# Patient Record
Sex: Female | Born: 1945
Health system: Southern US, Community
[De-identification: ages and names within clinical notes are randomized; demographics above are authoritative.]

## PROBLEM LIST (undated history)

## (undated) DIAGNOSIS — R112 Nausea with vomiting, unspecified: Secondary | ICD-10-CM

## (undated) DIAGNOSIS — E669 Obesity, unspecified: Secondary | ICD-10-CM

## (undated) DIAGNOSIS — E7800 Pure hypercholesterolemia, unspecified: Secondary | ICD-10-CM

## (undated) DIAGNOSIS — M545 Low back pain, unspecified: Secondary | ICD-10-CM

## (undated) DIAGNOSIS — I10 Essential (primary) hypertension: Secondary | ICD-10-CM

## (undated) DIAGNOSIS — T884XXA Failed or difficult intubation, initial encounter: Secondary | ICD-10-CM

## (undated) DIAGNOSIS — K589 Irritable bowel syndrome without diarrhea: Secondary | ICD-10-CM

## (undated) DIAGNOSIS — G4730 Sleep apnea, unspecified: Secondary | ICD-10-CM

## (undated) DIAGNOSIS — N6019 Diffuse cystic mastopathy of unspecified breast: Secondary | ICD-10-CM

## (undated) DIAGNOSIS — F32A Depression, unspecified: Secondary | ICD-10-CM

## (undated) DIAGNOSIS — Z8719 Personal history of other diseases of the digestive system: Secondary | ICD-10-CM

## (undated) DIAGNOSIS — E785 Hyperlipidemia, unspecified: Secondary | ICD-10-CM

## (undated) DIAGNOSIS — J45909 Unspecified asthma, uncomplicated: Secondary | ICD-10-CM

## (undated) DIAGNOSIS — Z9889 Other specified postprocedural states: Secondary | ICD-10-CM

## (undated) DIAGNOSIS — F419 Anxiety disorder, unspecified: Secondary | ICD-10-CM

## (undated) DIAGNOSIS — F329 Major depressive disorder, single episode, unspecified: Secondary | ICD-10-CM

## (undated) DIAGNOSIS — Z8489 Family history of other specified conditions: Secondary | ICD-10-CM

## (undated) DIAGNOSIS — G8929 Other chronic pain: Secondary | ICD-10-CM

## (undated) DIAGNOSIS — M1990 Unspecified osteoarthritis, unspecified site: Secondary | ICD-10-CM

## (undated) HISTORY — PX: JOINT REPLACEMENT: SHX530

## (undated) HISTORY — PX: BACK SURGERY: SHX140

## (undated) HISTORY — PX: COLONOSCOPY W/ POLYPECTOMY: SHX1380

## (undated) HISTORY — PX: DILATION AND CURETTAGE OF UTERUS: SHX78

## (undated) HISTORY — DX: Obesity, unspecified: E66.9

## (undated) HISTORY — DX: Diffuse cystic mastopathy of unspecified breast: N60.19

## (undated) HISTORY — PX: FINGER SURGERY: SHX640

---
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
## 1972-10-03 HISTORY — PX: EYE SURGERY: SHX253

## 1979-06-04 HISTORY — PX: BREAST CYST ASPIRATION: SHX578

## 1979-10-04 HISTORY — PX: LUMBAR LAMINECTOMY: SHX95

## 1979-10-04 HISTORY — PX: TUBAL LIGATION: SHX77

## 1981-10-03 HISTORY — PX: TONSILLECTOMY: SUR1361

## 1988-10-03 HISTORY — PX: DEBRIDEMENT TENNIS ELBOW: SHX1442

## 1994-10-03 HISTORY — PX: TOTAL ABDOMINAL HYSTERECTOMY: SHX209

## 1997-10-03 HISTORY — PX: LAPAROSCOPIC CHOLECYSTECTOMY: SUR755

## 2000-10-03 HISTORY — PX: BILATERAL TEMPOROMANDIBULAR JOINT ARTHROPLASTY: SUR77

## 2004-02-06 ENCOUNTER — Encounter: Admission: RE | Admit: 2004-02-06 | Discharge: 2004-02-06 | Payer: Self-pay | Admitting: Internal Medicine

## 2004-02-13 ENCOUNTER — Other Ambulatory Visit: Admission: RE | Admit: 2004-02-13 | Discharge: 2004-02-13 | Payer: Self-pay | Admitting: Family Medicine

## 2005-03-09 ENCOUNTER — Encounter: Admission: RE | Admit: 2005-03-09 | Discharge: 2005-03-09 | Payer: Self-pay | Admitting: Internal Medicine

## 2006-04-04 ENCOUNTER — Encounter: Admission: RE | Admit: 2006-04-04 | Discharge: 2006-04-04 | Payer: Self-pay | Admitting: Internal Medicine

## 2007-02-08 ENCOUNTER — Encounter (INDEPENDENT_AMBULATORY_CARE_PROVIDER_SITE_OTHER): Payer: Self-pay | Admitting: Specialist

## 2007-02-08 ENCOUNTER — Ambulatory Visit (HOSPITAL_BASED_OUTPATIENT_CLINIC_OR_DEPARTMENT_OTHER): Admission: RE | Admit: 2007-02-08 | Discharge: 2007-02-08 | Payer: Self-pay | Admitting: Orthopedic Surgery

## 2007-06-08 ENCOUNTER — Encounter: Admission: RE | Admit: 2007-06-08 | Discharge: 2007-06-08 | Payer: Self-pay | Admitting: Internal Medicine

## 2008-06-19 ENCOUNTER — Encounter: Admission: RE | Admit: 2008-06-19 | Discharge: 2008-06-19 | Payer: Self-pay | Admitting: Internal Medicine

## 2009-01-01 HISTORY — PX: LUMBAR DISC SURGERY: SHX700

## 2009-01-23 ENCOUNTER — Ambulatory Visit (HOSPITAL_COMMUNITY): Admission: RE | Admit: 2009-01-23 | Discharge: 2009-01-23 | Payer: Self-pay | Admitting: Neurological Surgery

## 2009-01-30 ENCOUNTER — Ambulatory Visit (HOSPITAL_COMMUNITY): Admission: RE | Admit: 2009-01-30 | Discharge: 2009-01-31 | Payer: Self-pay | Admitting: Neurological Surgery

## 2009-04-07 ENCOUNTER — Encounter: Admission: RE | Admit: 2009-04-07 | Discharge: 2009-04-07 | Payer: Self-pay | Admitting: Neurological Surgery

## 2009-05-26 ENCOUNTER — Encounter: Admission: RE | Admit: 2009-05-26 | Discharge: 2009-05-26 | Payer: Self-pay | Admitting: Neurological Surgery

## 2009-06-23 ENCOUNTER — Encounter: Admission: RE | Admit: 2009-06-23 | Discharge: 2009-06-23 | Payer: Self-pay | Admitting: Neurological Surgery

## 2009-06-23 ENCOUNTER — Encounter: Admission: RE | Admit: 2009-06-23 | Discharge: 2009-06-23 | Payer: Self-pay | Admitting: Internal Medicine

## 2009-08-03 HISTORY — PX: LUMBAR FUSION: SHX111

## 2009-08-13 ENCOUNTER — Inpatient Hospital Stay (HOSPITAL_COMMUNITY): Admission: RE | Admit: 2009-08-13 | Discharge: 2009-08-16 | Payer: Self-pay | Admitting: Neurological Surgery

## 2009-09-15 ENCOUNTER — Encounter: Admission: RE | Admit: 2009-09-15 | Discharge: 2009-09-15 | Payer: Self-pay | Admitting: Neurological Surgery

## 2009-11-23 ENCOUNTER — Encounter: Admission: RE | Admit: 2009-11-23 | Discharge: 2009-11-23 | Payer: Self-pay | Admitting: Neurological Surgery

## 2010-02-22 ENCOUNTER — Encounter: Admission: RE | Admit: 2010-02-22 | Discharge: 2010-02-22 | Payer: Self-pay | Admitting: Neurological Surgery

## 2010-05-24 ENCOUNTER — Encounter: Admission: RE | Admit: 2010-05-24 | Discharge: 2010-05-24 | Payer: Self-pay | Admitting: Neurological Surgery

## 2010-06-24 ENCOUNTER — Encounter: Admission: RE | Admit: 2010-06-24 | Discharge: 2010-06-24 | Payer: Self-pay | Admitting: Internal Medicine

## 2010-10-24 ENCOUNTER — Encounter: Payer: Self-pay | Admitting: Internal Medicine

## 2010-10-24 ENCOUNTER — Encounter: Payer: Self-pay | Admitting: Neurological Surgery

## 2011-01-05 LAB — BASIC METABOLIC PANEL
CO2: 29 mEq/L (ref 19–32)
Calcium: 9.3 mg/dL (ref 8.4–10.5)
Creatinine, Ser: 0.71 mg/dL (ref 0.4–1.2)
GFR calc Af Amer: >60 mL/min (ref >60)

## 2011-01-05 LAB — CBC
HCT: 39 % (ref 36.0–46.0)
Hemoglobin: 13.2 g/dL (ref 12.0–15.0)
MCHC: 33.9 g/dL (ref 30.0–36.0)
MCHC: 34.2 g/dL (ref 30.0–36.0)
MCHC: 34.3 g/dL (ref 30.0–36.0)
MCV: 89.2 fL (ref 78.0–100.0)
MCV: 89.4 fL (ref 78.0–100.0)
Platelets: 226 10*3/uL (ref 150–400)
Platelets: 237 10*3/uL (ref 150–400)
RBC: 4.31 MIL/uL (ref 3.87–5.11)
RDW: 13.6 % (ref 11.5–15.5)
RDW: 13.7 % (ref 11.5–15.5)
WBC: 6.9 10*3/uL (ref 4.0–10.5)

## 2011-01-05 LAB — DIFFERENTIAL
Basophils Absolute: 0 10*3/uL (ref 0.0–0.1)
Basophils Relative: 0 % (ref 0–1)
Monocytes Relative: 6 % (ref 3–12)
Neutro Abs: 4.3 10*3/uL (ref 1.7–7.7)
Neutrophils Relative %: 63 % (ref 43–77)

## 2011-01-05 LAB — PROTIME-INR
INR: 0.93 (ref 0.00–1.49)
Prothrombin Time: 12.4 seconds (ref 11.6–15.2)

## 2011-01-05 LAB — TYPE AND SCREEN

## 2011-01-05 LAB — APTT: aPTT: 29 seconds (ref 24–37)

## 2011-01-12 LAB — BASIC METABOLIC PANEL
Calcium: 9.9 mg/dL (ref 8.4–10.5)
Creatinine, Ser: 0.8 mg/dL (ref 0.4–1.2)
GFR calc Af Amer: >60 mL/min (ref >60)
GFR calc non Af Amer: >60 mL/min (ref >60)
Sodium: 140 mEq/L (ref 135–145)

## 2011-01-12 LAB — DIFFERENTIAL
Basophils Absolute: 0 10*3/uL (ref 0.0–0.1)
Basophils Relative: 0 % (ref 0–1)
Lymphocytes Relative: 8 % — ABNORMAL LOW (ref 12–46)
Monocytes Absolute: 0.5 10*3/uL (ref 0.1–1.0)
Monocytes Relative: 5 % (ref 3–12)
Neutro Abs: 10.1 10*3/uL — ABNORMAL HIGH (ref 1.7–7.7)
Neutrophils Relative %: 87 % — ABNORMAL HIGH (ref 43–77)

## 2011-01-12 LAB — APTT: aPTT: 26 seconds (ref 24–37)

## 2011-01-12 LAB — CBC
Hemoglobin: 14.8 g/dL (ref 12.0–15.0)
RBC: 4.92 MIL/uL (ref 3.87–5.11)

## 2011-01-12 LAB — PROTIME-INR: INR: 0.9 (ref 0.00–1.49)

## 2011-02-15 NOTE — Op Note (Signed)
NAMEDANAKA, LLERA                ACCOUNT NO.:  [id number]   MEDICAL RECORD NO.:  [id number]          PATIENT TYPE:  OIB   LOCATION:  3538                         FACILITY:  [facility]   PHYSICIAN:  Tia Alert, MD     DATE OF BIRTH:  Aug 22, 1946   DATE OF PROCEDURE:  01/30/2009  DATE OF DISCHARGE:                               OPERATIVE REPORT   PREOPERATIVE DIAGNOSIS:  Lumbar disk herniation, L4-5 on the right with  right L4 and L5 radiculopathies.   POSTOPERATIVE DIAGNOSIS:  Lumbar disk herniation, L4-5 on the right with  right L4 and L5 radiculopathies.   PROCEDURE:  Decompressive lumbar hemilaminectomy, medial facetectomy,  and foraminotomy at L4-5 on the right for decompression of the L4 and  the L5 nerve roots followed by microdiskectomy at L4-5 on the right  utilizing microscopic dissection.   SURGEON:  Tia Alert, MD   ASSISTANT:  Kathaleen Maser. Pool, MD   ANESTHESIA:  General endotracheal.   COMPLICATIONS:  None apparent.   INDICATIONS FOR THE PROCEDURE:  Ms. Mannor is a very pleasant 65 year old  female who was referred with severe right leg pain that seemed to fall  both in L4 and L5 distribution.  She had an MRI, which showed a large  herniated disk at L4-5 on the right with large free fragment extending  superiorly to compress both the L4 and the L5 nerve roots.  I  recommended a microdiskectomy at the L4-5 on the right side.  She  understood the risks, benefits, and expected outcome and wished to  proceed.   DESCRIPTION OF PROCEDURE:  The patient was taken to the operating room  and after induction of adequate generalized endotracheal anesthesia, she  was rolled into the prone position on the Wilson frame and all pressure  points were padded.  Her lumbar region was prepped with DuraPrep and  then draped in usual sterile fashion.  Local anesthesia 5 mL was  injected and a dorsal midline incision was made and carried down to the  lumbosacral fascia.  The fascia was  opened and the paraspinous  musculature was taken down in subperiosteal fashion to expose L4-5 on  the right.  Intraoperative x-ray confirmed my level and then I was able  used the high-speed drill and Kerrison punch to perform a  hemilaminectomy, medial facetectomy, and foraminotomy at L4-5 on the  right side.  I was able to open the yellow ligament and removed it in a  piecemeal fashion to expose the underlying dura and L4-L5 nerve roots.  There was a large fragment noted inferior and lateral to the dura.  I  was able to open this with a nerve hook and then remove it with a  pituitary rongeur.  I then brought in the operating microscope and  finished removing large pieces of disk from the axilla of the L4 nerve  root.  I then incised the disk space and performed a thorough  intradiskal diskectomy with pituitary rongeurs.  Once the diskectomy was  complete, I palpated with a nerve hook into the midline and into the  foramen to assure adequate decompression of the midline and the L4 and  the L5 nerve roots and found no more significant disk herniation.  I  irrigated with saline solution containing bacitracin, dried all bleeding  points with bipolar cautery and Surgifoam, irrigated with saline  solution containing bacitracin, dried all bleeding points, and then  removed the retractor and closed the fascia with 0 Vicryl.  We closed  the subcutaneous and subcuticular tissue with 2-0 and 3-0 Vicryl, and  then closed the skin with Benzoin and Steri-Strips.  The drapes were  removed.  A sterile dressing was applied.  The patient was awakened from  general anesthesia and taken to the recovery room in stable condition.  At the end of the procedure, all sponge, needle, and instrument counts  were correct.      Tia Alert, MD  Electronically Signed     DSJ/MEDQ  D:  01/30/2009  T:  01/31/2009  Job:  [id number]

## 2011-02-18 NOTE — Op Note (Signed)
NAMEBRAYLEA, Kimberly Lowery                ACCOUNT NO.:  [id number]   MEDICAL RECORD NO.:  [id number]          PATIENT TYPE:  AMB   LOCATION:  DSC                          FACILITY:  [facility]   PHYSICIAN:  Katy Fitch. Sypher, M.D. DATE OF BIRTH:  1946-04-17   DATE OF PROCEDURE:  02/08/2007  DATE OF DISCHARGE:                               OPERATIVE REPORT   PREOPERATIVE DIAGNOSIS:  Enlarging mass volar aspect of right index  finger PIP and flexor sheath.   POSTOPERATIVE DIAGNOSIS:  Enlarging mass volar aspect of right index  finger PIP and flexor sheath.   OPERATION:  Excisional biopsy of right index finger volar PIP region  mass.   OPERATING SURGEON:  Katy Fitch. Sypher, M.D.   ASSISTANT:  Molly Maduro Dasnoit PA-C.   ANESTHESIA:  General by LMA.   SUPERVISING ANESTHESIOLOGIST:  Janetta Hora. Gelene Mink, M.D.   INDICATIONS:  Kimberly Lowery is a 65 year old woman referred through the  courtesy of Dr. Johnella Moloney at [facility] for evaluation and  management of an enlarging mass in the volar aspect of her right index  finger flexor sheath adjacent to the PIP flexion crease.  This was  mildly tender.  There is no history of penetrating trauma.   We advised excisional biopsy for diagnosis and hopeful resolution of the  predicament.   After informed consent, she is brought to the operating room at this  time.  Preoperatively we advised her we could not offer prognosis until  we had a tissue diagnosis from her biopsy.  Questions were invited and  answered.   PROCEDURE:  Kimberly Lowery is brought to the operating room and placed in  the supine position upon the operating table.  After an anesthesia  consult with Dr. Gelene Mink due to multiple drug intolerances, Dr.  Gelene Mink recommended proceeding with general anesthesia by LMA.   Under Dr. Thornton Dales direct supervision, general anesthesia was induced  followed by routine Betadine scrub and paint of the right upper  extremity.  A pneumatic  tourniquet was applied to the proximal right  brachium.   Following exsanguination of right arm with an Esmarch bandage, the  arterial tourniquet was inflated to 260 mmHg due to mild systolic  hypertension.   The procedure commenced with review of her multiple drug allergies  followed by time-out and creation of a 1.3-cm incision directly over the  mass.  The subcutaneous tissues were carefully divided taking care to  identify the flexor sheath, radial neurovascular bundle and the mass.  The mass was multilobular, kind of grayish pink and appeared to be a  vascular structure with a certain degree of fibrous tissue present.  This could be a glomus or some other type of variant of an hemangioma.   The mass was circumferentially dissected, its feeding vessels  electrocauterized and the mass excised in toto.  This was placed in  formalin for transfer to the lab and histopathologic evaluation.   The wound was then inspect for bleeding points which were cauterized  with bipolar current followed by repair of the skin with an intradermal  4-0 Prolene suture with Steri-Strips.  A compressive dressing was applied with sterile gauze and Coban.   There were no apparent complications.   We intend to see Kimberly Lowery back for followup in the office in 1 week.  At  that time we should be able to discuss the results of her biopsy.  There  were no apparent complications.      Katy Fitch Sypher, M.D.  Electronically Signed     RVS/MEDQ  D:  02/08/2007  T:  02/08/2007  Job:  [id number]   cc:   Candyce Churn, M.D.  Katy Fitch Sypher, M.D.

## 2011-02-18 NOTE — Op Note (Signed)
Kimberly Lowery, Kimberly Lowery                ACCOUNT NO.:  [id number]   MEDICAL RECORD NO.:  [id number]          PATIENT TYPE:  AMB   LOCATION:  DSC                          FACILITY:  [facility]   PHYSICIAN:  Katy Fitch. Sypher, M.D. DATE OF BIRTH:  12-Jul-1946   DATE OF PROCEDURE:  DATE OF DISCHARGE:                               OPERATIVE REPORT   Audio too short to transcribe (less than 5 seconds)      Katy Fitch. Sypher, M.D.     RVS/MEDQ  D:  02/08/2007  T:  02/08/2007  Job:  [id number]

## 2011-05-26 ENCOUNTER — Other Ambulatory Visit: Payer: Self-pay | Admitting: Internal Medicine

## 2011-05-26 DIAGNOSIS — Z1231 Encounter for screening mammogram for malignant neoplasm of breast: Secondary | ICD-10-CM

## 2011-06-27 ENCOUNTER — Ambulatory Visit: Payer: Self-pay

## 2011-07-01 ENCOUNTER — Ambulatory Visit
Admission: RE | Admit: 2011-07-01 | Discharge: 2011-07-01 | Disposition: A | Discharge status: Home / Self Care | Payer: BC Managed Care – PPO | Source: Ambulatory Visit | Attending: Internal Medicine | Admitting: Internal Medicine

## 2011-07-01 DIAGNOSIS — Z1231 Encounter for screening mammogram for malignant neoplasm of breast: Secondary | ICD-10-CM

## 2011-07-03 IMAGING — CR DG LUMBAR SPINE 2-3V
3 series · 3 of 3 positions shown · non-contrast
Comparison: [HOSPITAL] lumbar spine MRI 01/23/2009 and
intraoperative portable lateral lumbar spine radiographs
01/30/2009.

CLINICAL DATA: HNP lumbar radiculopathy.

LUMBAR SPINE - 2-3 VIEW

[t l-spine a.p.]
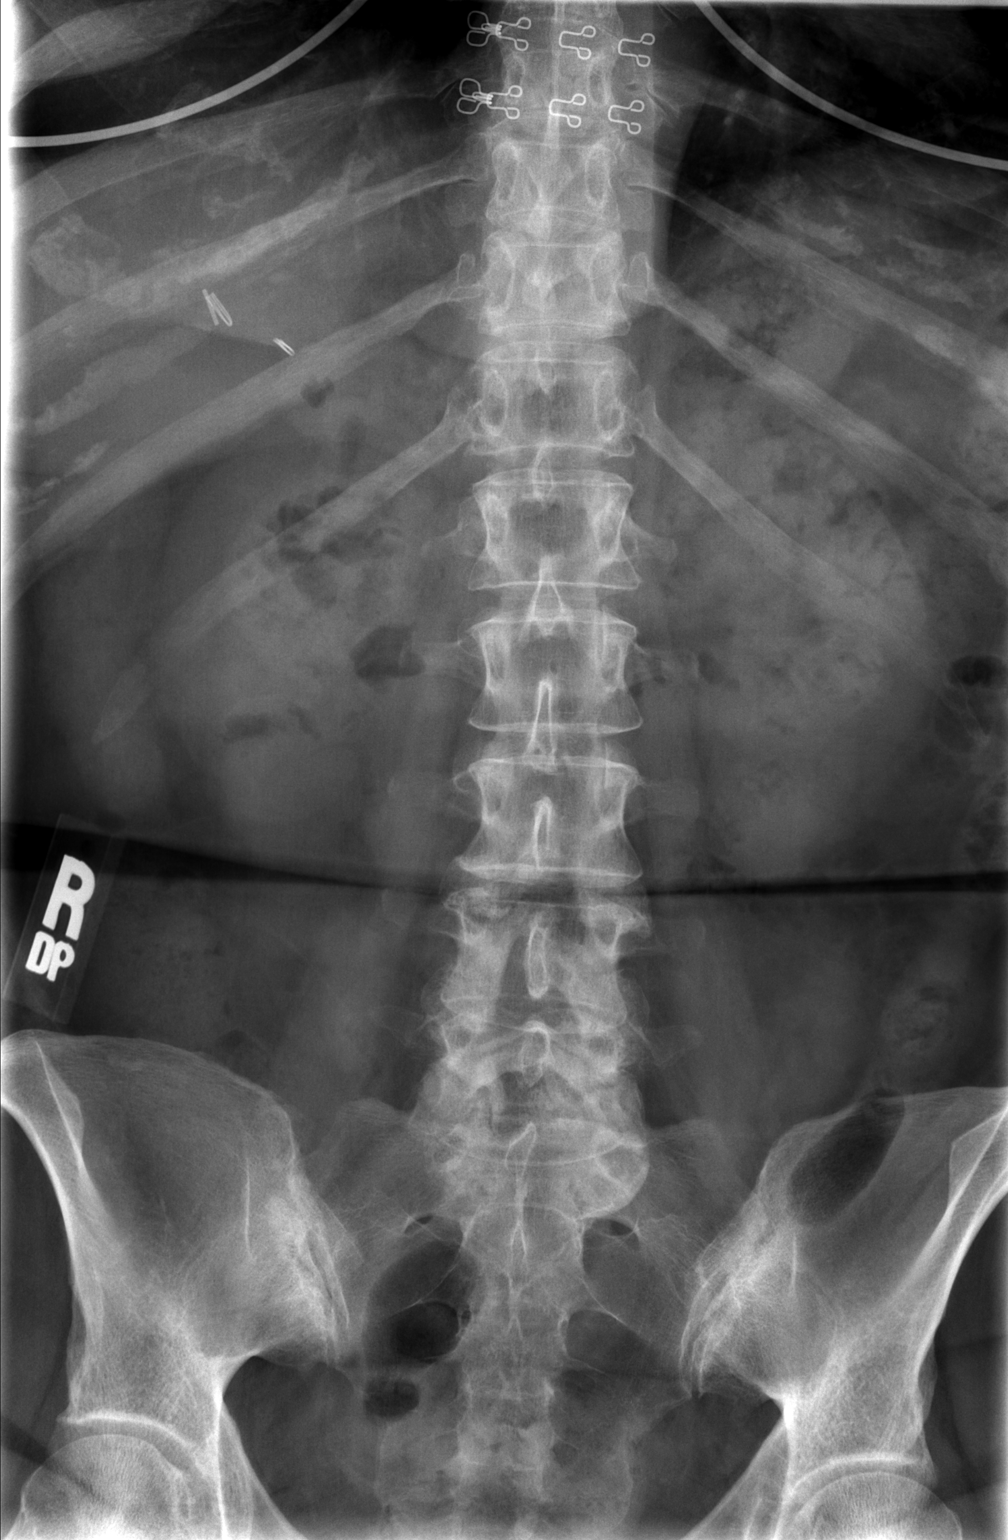

[t l-spine lat]
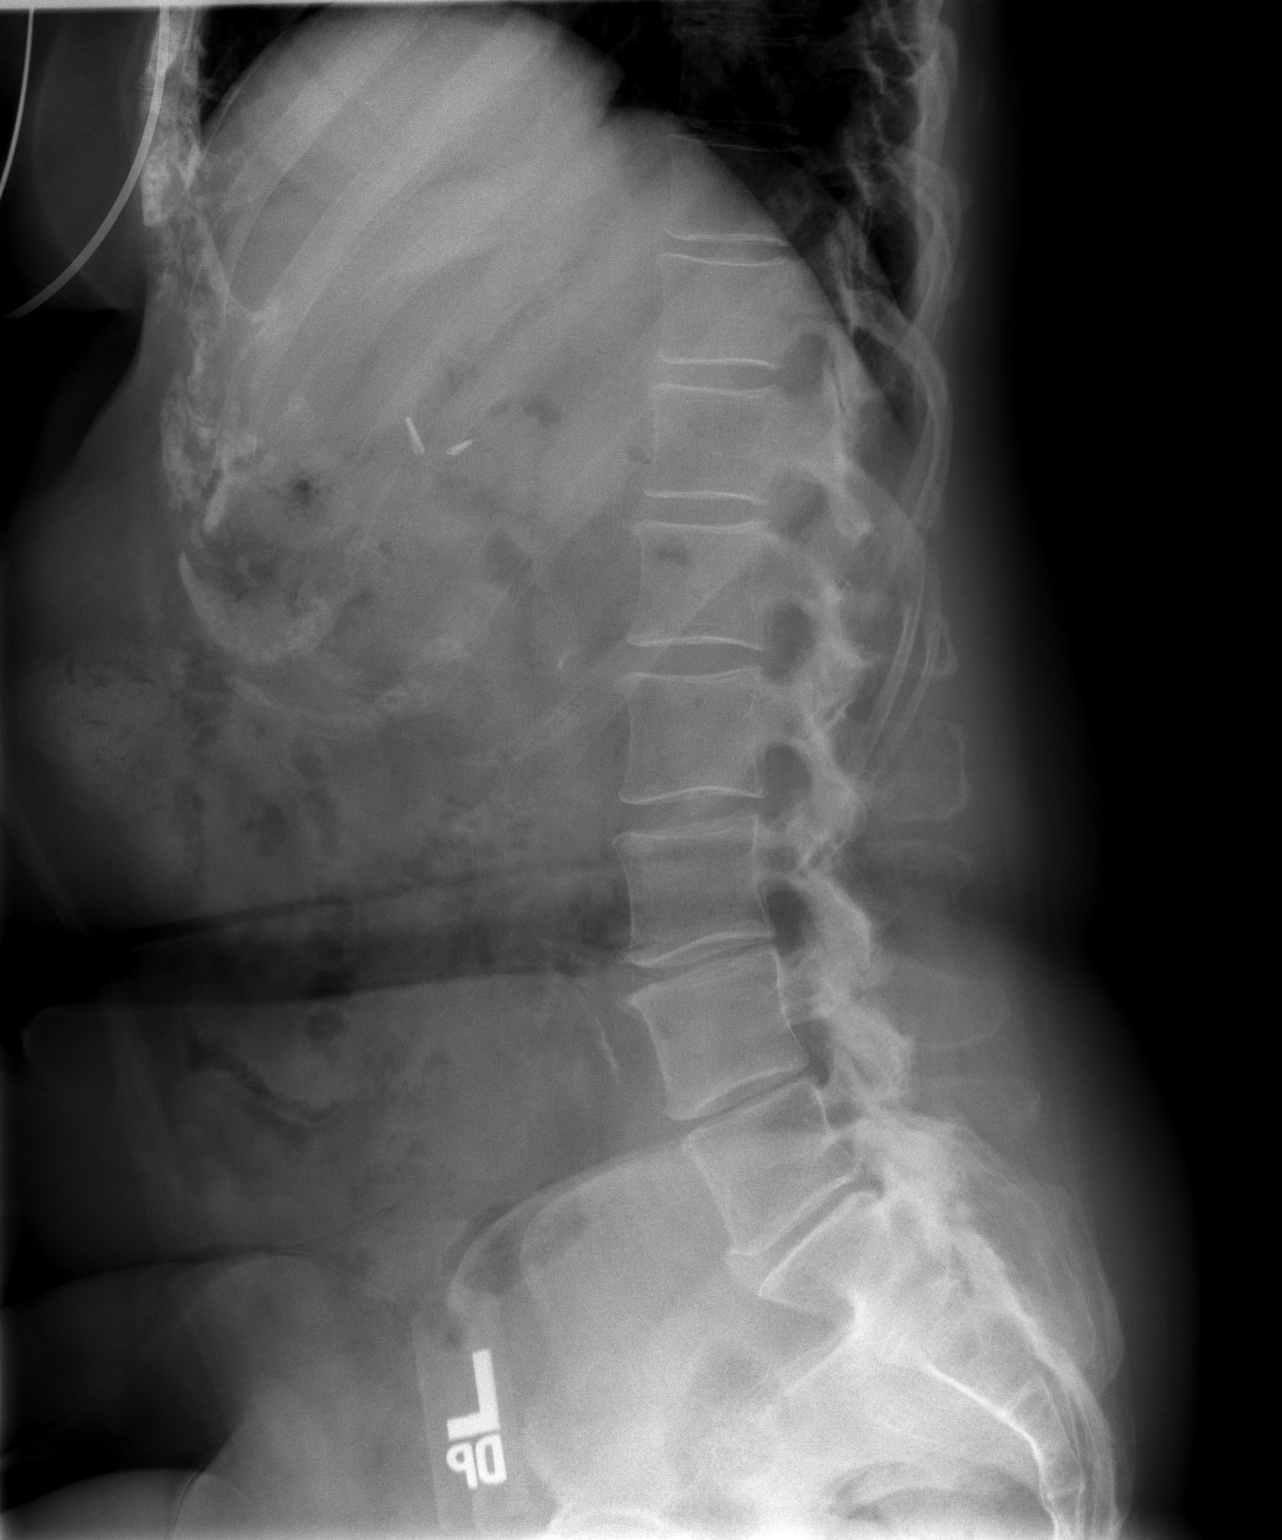

[t l-spine l5-s1 spot]
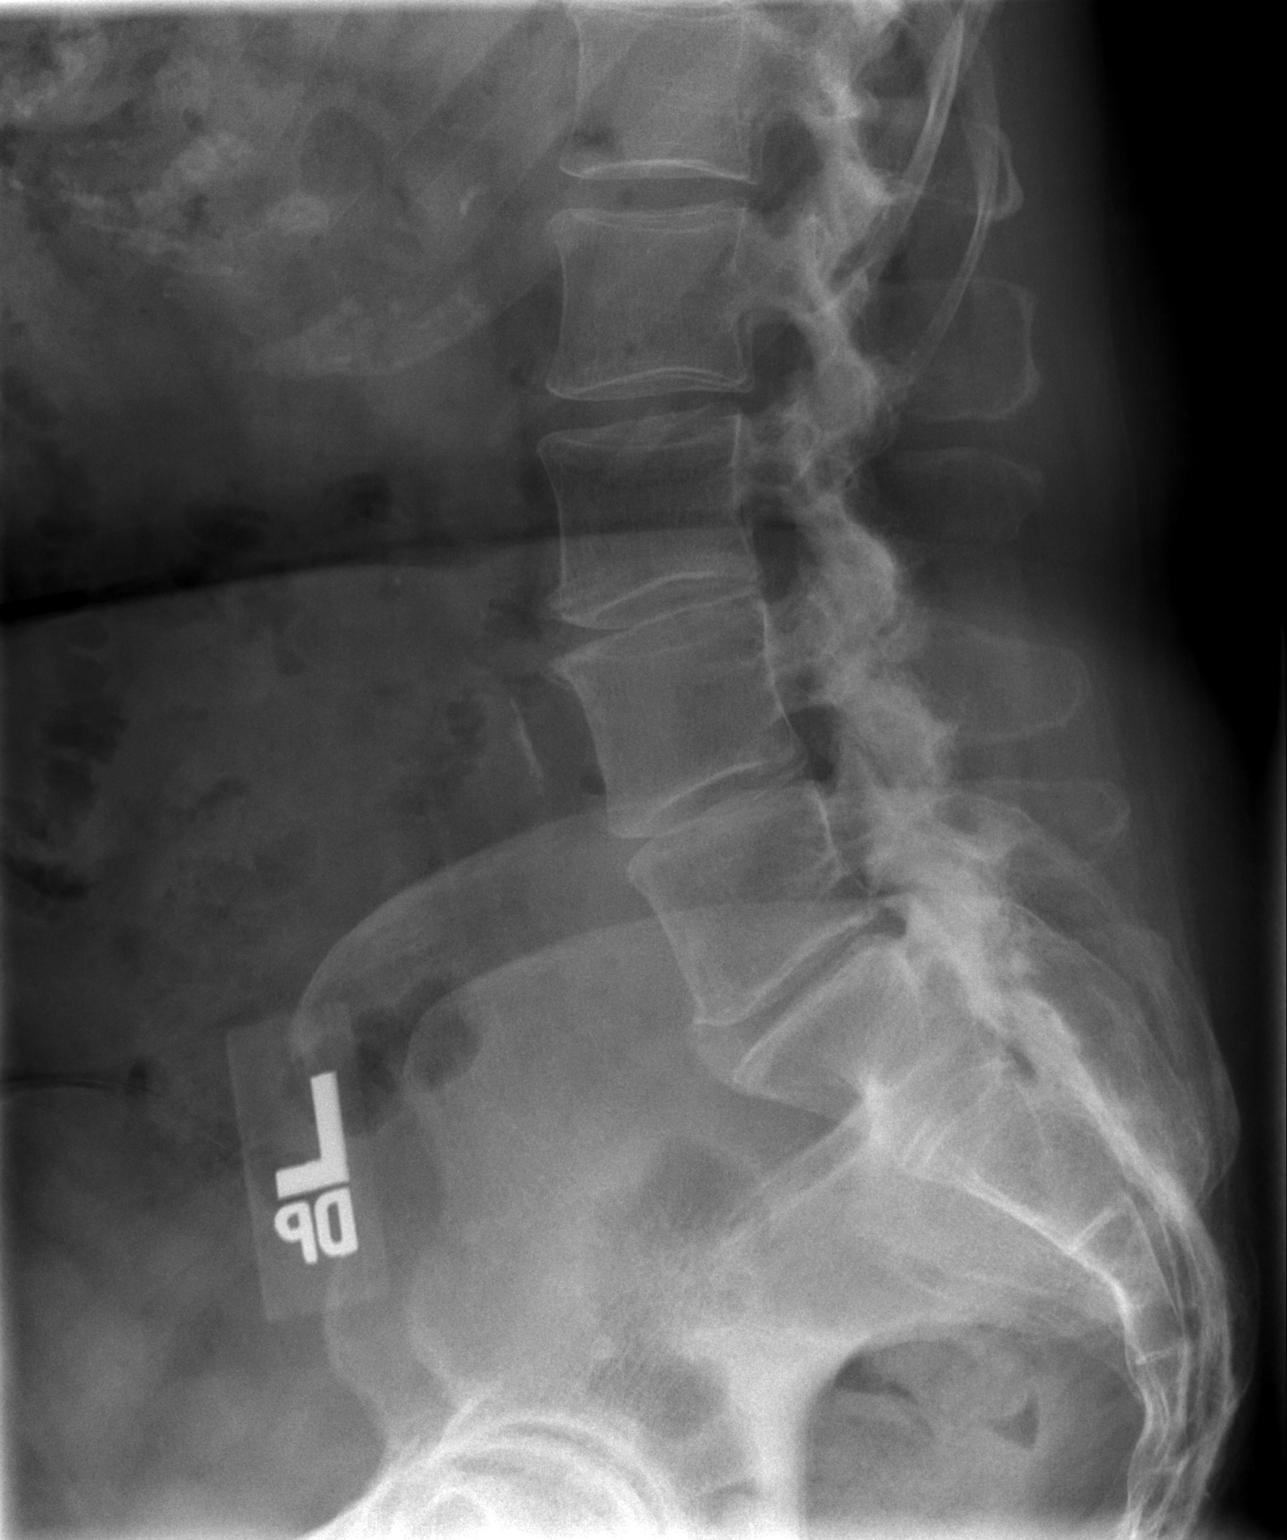

[3 of 3 positions shown; findings below may reference images not displayed]

FINDINGS: Five non-rib bearing lumbar vertebrae are seen with right
laminectomy L4-5.  Degenerative disc disease is moderately severe
at L4-5 and L5-S1 and moderate L3-4.  Posterior vertebral alignment
is maintained.  No other acute findings seen.  Slight atheromatous
vascular calcification and postcholecystectomy surgical clips
noted.
IMPRESSION: 1.  L4-5 right hemilaminectomy defect.
2.  Moderate L3-4 with moderately severe L4-5 and L5-S1
degenerative disc disease.
3.  No acute findings.

## 2012-06-06 ENCOUNTER — Other Ambulatory Visit: Payer: Self-pay | Admitting: Family Medicine

## 2012-06-06 DIAGNOSIS — Z1231 Encounter for screening mammogram for malignant neoplasm of breast: Secondary | ICD-10-CM

## 2012-07-02 ENCOUNTER — Other Ambulatory Visit: Payer: Self-pay | Admitting: Internal Medicine

## 2012-07-02 ENCOUNTER — Ambulatory Visit
Admission: RE | Admit: 2012-07-02 | Discharge: 2012-07-02 | Disposition: A | Discharge status: Home / Self Care | Payer: BC Managed Care – PPO | Source: Ambulatory Visit | Attending: Family Medicine | Admitting: Family Medicine

## 2012-07-02 DIAGNOSIS — Z1231 Encounter for screening mammogram for malignant neoplasm of breast: Secondary | ICD-10-CM

## 2013-06-05 ENCOUNTER — Other Ambulatory Visit: Payer: Self-pay

## 2013-06-05 DIAGNOSIS — Z1231 Encounter for screening mammogram for malignant neoplasm of breast: Secondary | ICD-10-CM

## 2013-07-03 ENCOUNTER — Ambulatory Visit: Payer: BC Managed Care – PPO

## 2013-07-09 ENCOUNTER — Ambulatory Visit: Payer: BC Managed Care – PPO

## 2013-07-19 ENCOUNTER — Ambulatory Visit
Admission: RE | Admit: 2013-07-19 | Discharge: 2013-07-19 | Disposition: A | Discharge status: Home / Self Care | Payer: Medicare Other | Source: Ambulatory Visit

## 2013-07-19 DIAGNOSIS — Z1231 Encounter for screening mammogram for malignant neoplasm of breast: Secondary | ICD-10-CM

## 2014-01-08 ENCOUNTER — Other Ambulatory Visit: Payer: Self-pay | Admitting: Physical Medicine and Rehabilitation

## 2014-01-08 DIAGNOSIS — Z7952 Long term (current) use of systemic steroids: Secondary | ICD-10-CM

## 2014-01-13 ENCOUNTER — Ambulatory Visit
Admission: RE | Admit: 2014-01-13 | Discharge: 2014-01-13 | Disposition: A | Discharge status: Home / Self Care | Payer: Medicare Other | Source: Ambulatory Visit | Attending: Physical Medicine and Rehabilitation | Admitting: Physical Medicine and Rehabilitation

## 2014-01-13 DIAGNOSIS — Z7952 Long term (current) use of systemic steroids: Secondary | ICD-10-CM

## 2014-02-17 ENCOUNTER — Other Ambulatory Visit: Payer: Self-pay | Admitting: Orthopaedic Surgery

## 2014-03-04 ENCOUNTER — Encounter (HOSPITAL_COMMUNITY): Payer: Self-pay

## 2014-03-07 ENCOUNTER — Encounter (HOSPITAL_COMMUNITY): Payer: Self-pay

## 2014-03-07 ENCOUNTER — Ambulatory Visit (HOSPITAL_COMMUNITY)
Admission: RE | Admit: 2014-03-07 | Discharge: 2014-03-07 | Disposition: A | Discharge status: Home / Self Care | Payer: Medicare Other | Source: Ambulatory Visit | Attending: Orthopaedic Surgery | Admitting: Orthopaedic Surgery

## 2014-03-07 ENCOUNTER — Encounter (HOSPITAL_COMMUNITY)
Admission: RE | Admit: 2014-03-07 | Discharge: 2014-03-07 | Disposition: A | Discharge status: Home / Self Care | Payer: Medicare Other | Source: Ambulatory Visit | Attending: Orthopaedic Surgery | Admitting: Orthopaedic Surgery

## 2014-03-07 DIAGNOSIS — Z01810 Encounter for preprocedural cardiovascular examination: Secondary | ICD-10-CM | POA: Insufficient documentation

## 2014-03-07 DIAGNOSIS — Z01818 Encounter for other preprocedural examination: Secondary | ICD-10-CM | POA: Insufficient documentation

## 2014-03-07 DIAGNOSIS — Z01812 Encounter for preprocedural laboratory examination: Secondary | ICD-10-CM | POA: Insufficient documentation

## 2014-03-07 HISTORY — DX: Unspecified osteoarthritis, unspecified site: M19.90

## 2014-03-07 HISTORY — DX: Personal history of other diseases of the digestive system: Z87.19

## 2014-03-07 HISTORY — DX: Major depressive disorder, single episode, unspecified: F32.9

## 2014-03-07 HISTORY — DX: Other specified postprocedural states: Z98.890

## 2014-03-07 HISTORY — DX: Nausea with vomiting, unspecified: R11.2

## 2014-03-07 HISTORY — DX: Other specified postprocedural states: R11.2

## 2014-03-07 HISTORY — DX: Unspecified asthma, uncomplicated: J45.909

## 2014-03-07 HISTORY — DX: Depression, unspecified: F32.A

## 2014-03-07 LAB — BASIC METABOLIC PANEL
BUN: 12 mg/dL (ref 6–23)
CALCIUM: 9.3 mg/dL (ref 8.4–10.5)
CHLORIDE: 104 meq/L (ref 96–112)
CO2: 28 meq/L (ref 19–32)
Creatinine, Ser: 0.68 mg/dL (ref 0.50–1.10)
GFR calc Af Amer: >90 mL/min (ref >90)
GFR calc non Af Amer: 90 mL/min — ABNORMAL LOW (ref >90)
GLUCOSE: 76 mg/dL (ref 70–99)
POTASSIUM: 4.4 meq/L (ref 3.7–5.3)
Sodium: 144 mEq/L (ref 137–147)

## 2014-03-07 LAB — URINALYSIS, ROUTINE W REFLEX MICROSCOPIC
BILIRUBIN URINE: NEGATIVE
Glucose, UA: NEGATIVE mg/dL
Hgb urine dipstick: NEGATIVE
KETONES UR: NEGATIVE mg/dL
Leukocytes, UA: NEGATIVE
NITRITE: NEGATIVE
Protein, ur: NEGATIVE mg/dL
SPECIFIC GRAVITY, URINE: 1.018 (ref 1.005–1.030)
UROBILINOGEN UA: 0.2 mg/dL (ref 0.0–1.0)
pH: 5.5 (ref 5.0–8.0)

## 2014-03-07 LAB — CBC WITH DIFFERENTIAL/PLATELET
Basophils Absolute: 0 10*3/uL (ref 0.0–0.1)
Basophils Relative: 0 % (ref 0–1)
EOS PCT: 2 % (ref 0–5)
Eosinophils Absolute: 0.2 10*3/uL (ref 0.0–0.7)
HCT: 42.4 % (ref 36.0–46.0)
HEMOGLOBIN: 13.5 g/dL (ref 12.0–15.0)
LYMPHS ABS: 1.9 10*3/uL (ref 0.7–4.0)
LYMPHS PCT: 26 % (ref 12–46)
MCH: 29 pg (ref 26.0–34.0)
MCHC: 31.8 g/dL (ref 30.0–36.0)
MCV: 91.2 fL (ref 78.0–100.0)
MONOS PCT: 6 % (ref 3–12)
Monocytes Absolute: 0.5 10*3/uL (ref 0.1–1.0)
Neutro Abs: 4.6 10*3/uL (ref 1.7–7.7)
Neutrophils Relative %: 66 % (ref 43–77)
Platelets: 280 10*3/uL (ref 150–400)
RBC: 4.65 MIL/uL (ref 3.87–5.11)
RDW: 14.3 % (ref 11.5–15.5)
WBC: 7.1 10*3/uL (ref 4.0–10.5)

## 2014-03-07 LAB — PROTIME-INR
INR: 0.92 (ref 0.00–1.49)
Prothrombin Time: 12.2 seconds (ref 11.6–15.2)

## 2014-03-07 LAB — APTT: APTT: 28 s (ref 24–37)

## 2014-03-07 LAB — SURGICAL PCR SCREEN
MRSA, PCR: NEGATIVE
Staphylococcus aureus: NEGATIVE

## 2014-03-07 LAB — TYPE AND SCREEN
ABO/RH(D): B POS
Antibody Screen: NEGATIVE

## 2014-03-07 MED ORDER — CHLORHEXIDINE GLUCONATE 4 % EX LIQD: 60.0000 mL | Freq: Once | CUTANEOUS | Status: DC

## 2014-03-07 NOTE — Pre-Procedure Instructions (Signed)
Kimberly Lowery  03/07/2014   Your procedure is scheduled on:  Tuesday June 16 th at 0730 AM  Report to [place] at 0530 AM     Call this number if you have problems the morning of surgery: [phone]   Remember:   Do not eat food or drink liquids after midnight Monday.   Take these medicines the morning of surgery with A SIP OF WATER: Fluoxetine (Prozac),and Omeprazole (Prilosec)   Stop Aspirin, Celebrex, Coenzyme Q-10, Nsaids, and Herbal medication 5 days prior to surgery.   Do not wear jewelry, make-up or nail polish.  Do not wear lotions, powders, or perfumes. You may wear deodorant.  Do not shave 48 hours prior to surgery.  Do not bring valuables to the hospital.  [facility] is not responsible for any belongings or valuables.               Contacts, dentures or bridgework may not be worn into surgery.  Leave suitcase in the car. After surgery it may be brought to your room.  For patients admitted to the hospital, discharge time is determined by your treatment team.               Patients discharged the day of surgery will not be allowed to drive home.    Special Instructions: [place] - Preparing for Surgery  Before surgery, you can play an important role.  Because skin is not sterile, your skin needs to be as free of germs as possible.  You can reduce the number of germs on you skin by washing with CHG (chlorahexidine gluconate) soap before surgery.  CHG is an antiseptic cleaner which kills germs and bonds with the skin to continue killing germs even after washing.  Please DO NOT use if you have an allergy to CHG or antibacterial soaps.  If your skin becomes reddened/irritated stop using the CHG and inform your nurse when you arrive at Short Stay.  Do not shave (including legs and underarms) for at least 48 hours prior to the first CHG shower.  You may shave your face.  Please follow these instructions carefully:   1.  Shower with CHG Soap the  night before surgery and the                                morning of Surgery.  2.  If you choose to wash your hair, wash your hair first as usual with your       normal shampoo.  3.  After you shampoo, rinse your hair and body thoroughly to remove the                      Shampoo.  4.  Use CHG as you would any other liquid soap.  You can apply chg directly       to the skin and wash gently with scrungie or a clean washcloth.  5.  Apply the CHG Soap to your body ONLY FROM THE NECK DOWN.        Do not use on open wounds or open sores.  Avoid contact with your eyes,       ears, mouth and genitals (private parts).  Wash genitals (private parts)       with your normal soap.  6.  Wash thoroughly, paying special attention to the area where your surgery  will be performed.  7.  Thoroughly rinse your body with warm water from the neck down.  8.  DO NOT shower/wash with your normal soap after using and rinsing off       the CHG Soap.  9.  Pat yourself dry with a clean towel.            10.  Wear clean pajamas.            11.  Place clean sheets on your bed the night of your first shower and do not        sleep with pets.  Day of Surgery  Do not apply any lotions/deoderants the morning of surgery.  Please wear clean clothes to the hospital/surgery center.      Please read over the following fact sheets that you were given: Pain Booklet, Coughing and Deep Breathing, Blood Transfusion Information, MRSA Information and Surgical Site Infection Prevention

## 2014-03-14 NOTE — H&P (Signed)
TOTAL HIP ADMISSION H&P  Patient is admitted for right total hip arthroplasty.  Subjective:  Chief Complaint: right hip pain  HPI: Kimberly Lowery, 68 y.o. female, has a history of pain and functional disability in the right hip(s) due to arthritis and patient has failed non-surgical conservative treatments for greater than 12 weeks to include NSAID's and/or analgesics, corticosteriod injections, flexibility and strengthening excercises, weight reduction as appropriate and activity modification.  Onset of symptoms was gradual starting 5 years ago with gradually worsening course since that time.The patient noted no past surgery on the right hip(s).  Patient currently rates pain in the right hip at 10 out of 10 with activity. Patient has night pain, worsening of pain with activity and weight bearing and pain that interfers with activities of daily living. Patient has evidence of subchondral cysts, subchondral sclerosis, periarticular osteophytes and joint space narrowing by imaging studies. This condition presents safety issues increasing the risk of falls. There is no current active infection.  There are no active problems to display for this patient.  Past Medical History  Diagnosis Date  . PONV (postoperative nausea and vomiting)   . Heart murmur     no problem, stated a bruit in right carotid  . Asthma   . Depression   . GERD (gastroesophageal reflux disease)   . H/O hiatal hernia   . Arthritis   . Anemia   . Blood dyscrasia     grandfather had hx bone marrow Ca    Past Surgical History  Procedure Laterality Date  . Eye surgery Bilateral 1974  . Bilateral temporomandibular joint arthroplasty  2002  . Tonsillectomy  1983  . Cholecystectomy  1999  . Tubal ligation  1981  . Abdominal hysterectomy  1996    total abdominal  . Elbow surgery Right 1990  . Finger surgery Right     index cyst removed  . Back surgery  1981    Laminectomy  . Back surgery  2010    disectomy  . Back  surgery  09/2009    fusion    No prescriptions prior to admission   Allergies  Allergen Reactions  . Tape     causes blisters  . Codeine Nausea And Vomiting  . Nucynta [Tapentadol]     hallucinations   . Percocet [Oxycodone-Acetaminophen] Nausea And Vomiting  . Shrimp [Shellfish Allergy] Nausea And Vomiting    History  Substance Use Topics  . Smoking status: Never Smoker   . Smokeless tobacco: Never Used  . Alcohol Use: Yes     Comment: social    No family history on file.   Review of Systems  Musculoskeletal: Positive for joint pain.       Right hip  All other systems reviewed and are negative.   Objective:  Physical Exam  Constitutional: She is oriented to person, place, and time. She appears well-developed and well-nourished.  HENT:  Head: Normocephalic and atraumatic.  Eyes: Conjunctivae and EOM are normal. Pupils are equal, round, and reactive to light.  Neck: Normal range of motion.  Cardiovascular: Normal rate and regular rhythm.   Respiratory: Effort normal.  GI: Soft.  Musculoskeletal:  Her right hip motion is little limited.  She has significant pain on internal rotation.  There is no hip flexion contracture.  Knee motion is full.  Straight leg raise is negative.  There is no palpable lymphadenopathy.  Sensation and motor function are intact in her feet with palpable pulses on both sides.  Neurological: She is alert and oriented to person, place, and time.  Skin: Skin is warm and dry.  Psychiatric: She has a normal mood and affect. Her behavior is normal. Judgment and thought content normal.    Vital signs in last 24 hours:    Labs:   There is no height or weight on file to calculate BMI.   Imaging Review Plain radiographs demonstrate severe degenerative joint disease of the right hip(s). The bone quality appears to be good for age and reported activity level.  Assessment/Plan:  End stage arthritis, right hip(s)  The patient history,  physical examination, clinical judgement of the provider and imaging studies are consistent with end stage degenerative joint disease of the right hip(s) and total hip arthroplasty is deemed medically necessary. The treatment options including medical management, injection therapy, arthroscopy and arthroplasty were discussed at length. The risks and benefits of total hip arthroplasty were presented and reviewed. The risks due to aseptic loosening, infection, stiffness, dislocation/subluxation,  thromboembolic complications and other imponderables were discussed.  The patient acknowledged the explanation, agreed to proceed with the plan and consent was signed. Patient is being admitted for inpatient treatment for surgery, pain control, PT, OT, prophylactic antibiotics, VTE prophylaxis, progressive ambulation and ADL's and discharge planning.The patient is planning to be discharged home with home health services.

## 2014-03-17 MED ORDER — TRANEXAMIC ACID 100 MG/ML IV SOLN
1000.0000 mg | INTRAVENOUS | Status: AC
  Administered 2014-03-18: 1000 mg via INTRAVENOUS
  Filled 2014-03-17: qty 10

## 2014-03-17 MED ORDER — CEFAZOLIN SODIUM-DEXTROSE 2-3 GM-% IV SOLR
2.0000 g | INTRAVENOUS | Status: AC
  Administered 2014-03-18: 2 g via INTRAVENOUS
  Filled 2014-03-17: qty 50

## 2014-03-17 MED ORDER — LACTATED RINGERS IV SOLN: INTRAVENOUS | Status: DC

## 2014-03-18 ENCOUNTER — Inpatient Hospital Stay (HOSPITAL_COMMUNITY): Payer: Medicare Other | Admitting: Anesthesiology

## 2014-03-18 ENCOUNTER — Encounter (HOSPITAL_COMMUNITY): Payer: Self-pay | Admitting: General Practice

## 2014-03-18 ENCOUNTER — Inpatient Hospital Stay (HOSPITAL_COMMUNITY)
Admission: RE | Admit: 2014-03-18 | Discharge: 2014-03-20 | DRG: 470 | Disposition: A | Discharge status: Home Health | Payer: Medicare Other | Source: Ambulatory Visit | Attending: Orthopaedic Surgery | Admitting: Orthopaedic Surgery

## 2014-03-18 ENCOUNTER — Encounter (HOSPITAL_COMMUNITY)
Admission: RE | Disposition: A | Discharge status: Home Health | Payer: Self-pay | Source: Ambulatory Visit | Attending: Orthopaedic Surgery

## 2014-03-18 ENCOUNTER — Encounter (HOSPITAL_COMMUNITY): Payer: Medicare Other | Admitting: Anesthesiology

## 2014-03-18 ENCOUNTER — Inpatient Hospital Stay (HOSPITAL_COMMUNITY): Payer: Medicare Other

## 2014-03-18 DIAGNOSIS — F411 Generalized anxiety disorder: Secondary | ICD-10-CM | POA: Diagnosis present

## 2014-03-18 DIAGNOSIS — G4730 Sleep apnea, unspecified: Secondary | ICD-10-CM | POA: Diagnosis present

## 2014-03-18 DIAGNOSIS — J45909 Unspecified asthma, uncomplicated: Secondary | ICD-10-CM | POA: Diagnosis present

## 2014-03-18 DIAGNOSIS — K219 Gastro-esophageal reflux disease without esophagitis: Secondary | ICD-10-CM | POA: Diagnosis present

## 2014-03-18 DIAGNOSIS — F329 Major depressive disorder, single episode, unspecified: Secondary | ICD-10-CM | POA: Diagnosis present

## 2014-03-18 DIAGNOSIS — G8929 Other chronic pain: Secondary | ICD-10-CM | POA: Diagnosis present

## 2014-03-18 DIAGNOSIS — M545 Low back pain, unspecified: Secondary | ICD-10-CM | POA: Diagnosis present

## 2014-03-18 DIAGNOSIS — M1610 Unilateral primary osteoarthritis, unspecified hip: Principal | ICD-10-CM | POA: Diagnosis present

## 2014-03-18 DIAGNOSIS — M169 Osteoarthritis of hip, unspecified: Secondary | ICD-10-CM | POA: Diagnosis present

## 2014-03-18 DIAGNOSIS — D649 Anemia, unspecified: Secondary | ICD-10-CM | POA: Diagnosis present

## 2014-03-18 DIAGNOSIS — Z96698 Presence of other orthopedic joint implants: Secondary | ICD-10-CM

## 2014-03-18 DIAGNOSIS — F3289 Other specified depressive episodes: Secondary | ICD-10-CM | POA: Diagnosis present

## 2014-03-18 HISTORY — DX: Sleep apnea, unspecified: G47.30

## 2014-03-18 HISTORY — DX: Other chronic pain: G89.29

## 2014-03-18 HISTORY — DX: Low back pain: M54.5

## 2014-03-18 HISTORY — DX: Anxiety disorder, unspecified: F41.9

## 2014-03-18 HISTORY — DX: Pure hypercholesterolemia, unspecified: E78.00

## 2014-03-18 HISTORY — DX: Family history of other specified conditions: Z84.89

## 2014-03-18 HISTORY — PX: TOTAL HIP ARTHROPLASTY: SHX124

## 2014-03-18 HISTORY — DX: Low back pain, unspecified: M54.50

## 2014-03-18 SURGERY — ARTHROPLASTY, HIP, TOTAL, ANTERIOR APPROACH
Anesthesia: General | Site: Hip | Laterality: Right

## 2014-03-18 MED ORDER — MENTHOL 3 MG MT LOZG: 1.0000 | LOZENGE | OROMUCOSAL | Status: DC | PRN

## 2014-03-18 MED ORDER — METHOCARBAMOL 1000 MG/10ML IJ SOLN
500.0000 mg | Freq: Four times a day (QID) | INTRAMUSCULAR | Status: DC | PRN
  Filled 2014-03-18: qty 5

## 2014-03-18 MED ORDER — ONDANSETRON HCL 4 MG/2ML IJ SOLN
INTRAMUSCULAR | Status: DC | PRN
  Administered 2014-03-18: 4 mg via INTRAVENOUS

## 2014-03-18 MED ORDER — METHOCARBAMOL 500 MG PO TABS
ORAL_TABLET | ORAL | Status: AC
  Administered 2014-03-18: 500 mg via ORAL
  Filled 2014-03-18: qty 1

## 2014-03-18 MED ORDER — HYDROMORPHONE HCL PF 1 MG/ML IJ SOLN
INTRAMUSCULAR | Status: AC
  Administered 2014-03-18: 0.25 mg via INTRAVENOUS
  Filled 2014-03-18: qty 1

## 2014-03-18 MED ORDER — HYDROMORPHONE HCL PF 1 MG/ML IJ SOLN
INTRAMUSCULAR | Status: AC
  Filled 2014-03-18: qty 1

## 2014-03-18 MED ORDER — EPHEDRINE SULFATE 50 MG/ML IJ SOLN
INTRAMUSCULAR | Status: DC | PRN
  Administered 2014-03-18: 10 mg via INTRAVENOUS
  Administered 2014-03-18: 5 mg via INTRAVENOUS

## 2014-03-18 MED ORDER — PROMETHAZINE HCL 25 MG/ML IJ SOLN: 6.2500 mg | INTRAMUSCULAR | Status: DC | PRN

## 2014-03-18 MED ORDER — HYDROMORPHONE HCL PF 1 MG/ML IJ SOLN
0.2500 mg | INTRAMUSCULAR | Status: DC | PRN
  Administered 2014-03-18: 0.25 mg via INTRAVENOUS
  Administered 2014-03-18: 0.5 mg via INTRAVENOUS
  Administered 2014-03-18: 0.25 mg via INTRAVENOUS

## 2014-03-18 MED ORDER — FENTANYL CITRATE 0.05 MG/ML IJ SOLN
INTRAMUSCULAR | Status: DC | PRN
  Administered 2014-03-18: 100 ug via INTRAVENOUS
  Administered 2014-03-18 (×2): 50 ug via INTRAVENOUS
  Administered 2014-03-18 (×2): 25 ug via INTRAVENOUS
  Administered 2014-03-18: 100 ug via INTRAVENOUS
  Administered 2014-03-18: 50 ug via INTRAVENOUS

## 2014-03-18 MED ORDER — HYDROCODONE-ACETAMINOPHEN 5-325 MG PO TABS
1.0000 | ORAL_TABLET | ORAL | Status: DC | PRN
  Administered 2014-03-18 – 2014-03-19 (×4): 1 via ORAL
  Filled 2014-03-18: qty 2
  Filled 2014-03-18 (×4): qty 1

## 2014-03-18 MED ORDER — BISACODYL 5 MG PO TBEC: 5.0000 mg | DELAYED_RELEASE_TABLET | Freq: Every day | ORAL | Status: DC | PRN

## 2014-03-18 MED ORDER — PROPOFOL 10 MG/ML IV BOLUS
INTRAVENOUS | Status: DC | PRN
  Administered 2014-03-18: 160 mg via INTRAVENOUS
  Administered 2014-03-18: 20 mg via INTRAVENOUS

## 2014-03-18 MED ORDER — VITAMIN D3 25 MCG (1000 UNIT) PO TABS
4000.0000 [IU] | ORAL_TABLET | Freq: Every day | ORAL | Status: DC
  Administered 2014-03-19 – 2014-03-20 (×2): 4000 [IU] via ORAL
  Filled 2014-03-18 (×2): qty 4

## 2014-03-18 MED ORDER — NEOSTIGMINE METHYLSULFATE 10 MG/10ML IV SOLN
INTRAVENOUS | Status: AC
  Filled 2014-03-18: qty 1

## 2014-03-18 MED ORDER — HYDROMORPHONE HCL PF 1 MG/ML IJ SOLN: 0.5000 mg | INTRAMUSCULAR | Status: DC | PRN

## 2014-03-18 MED ORDER — 0.9 % SODIUM CHLORIDE (POUR BTL) OPTIME
TOPICAL | Status: DC | PRN
  Administered 2014-03-18: 1000 mL

## 2014-03-18 MED ORDER — FENTANYL CITRATE 0.05 MG/ML IJ SOLN
INTRAMUSCULAR | Status: AC
  Filled 2014-03-18: qty 5

## 2014-03-18 MED ORDER — DIPHENHYDRAMINE HCL 12.5 MG/5ML PO ELIX: 12.5000 mg | ORAL_SOLUTION | ORAL | Status: DC | PRN

## 2014-03-18 MED ORDER — GLYCOPYRROLATE 0.2 MG/ML IJ SOLN
INTRAMUSCULAR | Status: DC | PRN
  Administered 2014-03-18: 0.6 mg via INTRAVENOUS

## 2014-03-18 MED ORDER — METHOCARBAMOL 500 MG PO TABS
500.0000 mg | ORAL_TABLET | Freq: Four times a day (QID) | ORAL | Status: DC | PRN
  Administered 2014-03-18 – 2014-03-20 (×3): 500 mg via ORAL
  Filled 2014-03-18 (×2): qty 1

## 2014-03-18 MED ORDER — PROPOFOL 10 MG/ML IV BOLUS
INTRAVENOUS | Status: AC
  Filled 2014-03-18: qty 20

## 2014-03-18 MED ORDER — LACTATED RINGERS IV SOLN
INTRAVENOUS | Status: DC
  Administered 2014-03-18: 20:00:00 via INTRAVENOUS

## 2014-03-18 MED ORDER — ROCURONIUM BROMIDE 100 MG/10ML IV SOLN
INTRAVENOUS | Status: DC | PRN
  Administered 2014-03-18 (×2): 10 mg via INTRAVENOUS
  Administered 2014-03-18: 50 mg via INTRAVENOUS

## 2014-03-18 MED ORDER — PHENOL 1.4 % MT LIQD: 1.0000 | OROMUCOSAL | Status: DC | PRN

## 2014-03-18 MED ORDER — FLUOXETINE HCL 20 MG PO CAPS
20.0000 mg | ORAL_CAPSULE | Freq: Every day | ORAL | Status: DC
  Administered 2014-03-18 – 2014-03-20 (×3): 20 mg via ORAL
  Filled 2014-03-18 (×3): qty 1

## 2014-03-18 MED ORDER — KETOROLAC TROMETHAMINE 30 MG/ML IJ SOLN
15.0000 mg | Freq: Once | INTRAMUSCULAR | Status: AC | PRN
  Administered 2014-03-18: 30 mg via INTRAVENOUS

## 2014-03-18 MED ORDER — CHOLECALCIFEROL 100 MCG (4000 UT) PO CAPS: 4000.0000 [IU] | ORAL_CAPSULE | Freq: Every day | ORAL | Status: DC

## 2014-03-18 MED ORDER — DEXAMETHASONE SODIUM PHOSPHATE 4 MG/ML IJ SOLN
INTRAMUSCULAR | Status: DC | PRN
  Administered 2014-03-18: 4 mg via INTRAVENOUS

## 2014-03-18 MED ORDER — PANTOPRAZOLE SODIUM 40 MG PO TBEC
40.0000 mg | DELAYED_RELEASE_TABLET | Freq: Every day | ORAL | Status: DC
Start: 2014-03-18 — End: 2014-03-20
  Administered 2014-03-18 – 2014-03-20 (×3): 40 mg via ORAL
  Filled 2014-03-18 (×3): qty 1

## 2014-03-18 MED ORDER — OMEPRAZOLE MAGNESIUM 20 MG PO TBEC: 20.0000 mg | DELAYED_RELEASE_TABLET | Freq: Every day | ORAL | Status: DC

## 2014-03-18 MED ORDER — LIDOCAINE HCL (CARDIAC) 20 MG/ML IV SOLN
INTRAVENOUS | Status: DC | PRN
  Administered 2014-03-18: 60 mg via INTRAVENOUS

## 2014-03-18 MED ORDER — ATORVASTATIN CALCIUM 20 MG PO TABS
20.0000 mg | ORAL_TABLET | Freq: Every day | ORAL | Status: DC
  Administered 2014-03-18 – 2014-03-19 (×2): 20 mg via ORAL
  Filled 2014-03-18 (×3): qty 1

## 2014-03-18 MED ORDER — KETOROLAC TROMETHAMINE 30 MG/ML IJ SOLN
INTRAMUSCULAR | Status: AC
  Filled 2014-03-18: qty 1

## 2014-03-18 MED ORDER — ONDANSETRON HCL 4 MG/2ML IJ SOLN: 4.0000 mg | Freq: Four times a day (QID) | INTRAMUSCULAR | Status: DC | PRN

## 2014-03-18 MED ORDER — MIDAZOLAM HCL 2 MG/2ML IJ SOLN
INTRAMUSCULAR | Status: AC
  Filled 2014-03-18: qty 2

## 2014-03-18 MED ORDER — ACETAMINOPHEN 650 MG RE SUPP: 650.0000 mg | Freq: Four times a day (QID) | RECTAL | Status: DC | PRN

## 2014-03-18 MED ORDER — ACETAMINOPHEN 325 MG PO TABS
650.0000 mg | ORAL_TABLET | Freq: Four times a day (QID) | ORAL | Status: DC | PRN
  Administered 2014-03-19 – 2014-03-20 (×3): 650 mg via ORAL
  Filled 2014-03-18 (×3): qty 2

## 2014-03-18 MED ORDER — NEOSTIGMINE METHYLSULFATE 10 MG/10ML IV SOLN
INTRAVENOUS | Status: DC | PRN
  Administered 2014-03-18: 4 mg via INTRAVENOUS

## 2014-03-18 MED ORDER — CLINDAMYCIN PHOSPHATE 1 % EX LOTN: 1.0000 "application " | TOPICAL_LOTION | Freq: Every day | CUTANEOUS | Status: DC

## 2014-03-18 MED ORDER — LACTATED RINGERS IV SOLN
INTRAVENOUS | Status: DC | PRN
  Administered 2014-03-18 (×2): via INTRAVENOUS

## 2014-03-18 MED ORDER — METOCLOPRAMIDE HCL 5 MG/ML IJ SOLN: 5.0000 mg | Freq: Three times a day (TID) | INTRAMUSCULAR | Status: DC | PRN

## 2014-03-18 MED ORDER — MIDAZOLAM HCL 5 MG/5ML IJ SOLN
INTRAMUSCULAR | Status: DC | PRN
  Administered 2014-03-18: 2 mg via INTRAVENOUS

## 2014-03-18 MED ORDER — ASPIRIN EC 325 MG PO TBEC
325.0000 mg | DELAYED_RELEASE_TABLET | Freq: Two times a day (BID) | ORAL | Status: DC
  Administered 2014-03-19 – 2014-03-20 (×3): 325 mg via ORAL
  Filled 2014-03-18 (×5): qty 1

## 2014-03-18 MED ORDER — DOCUSATE SODIUM 100 MG PO CAPS
100.0000 mg | ORAL_CAPSULE | Freq: Two times a day (BID) | ORAL | Status: DC
  Administered 2014-03-18 – 2014-03-20 (×5): 100 mg via ORAL
  Filled 2014-03-18 (×6): qty 1

## 2014-03-18 MED ORDER — GLYCOPYRROLATE 0.2 MG/ML IJ SOLN
INTRAMUSCULAR | Status: AC
  Filled 2014-03-18: qty 3

## 2014-03-18 MED ORDER — CEFAZOLIN SODIUM-DEXTROSE 2-3 GM-% IV SOLR
2.0000 g | Freq: Four times a day (QID) | INTRAVENOUS | Status: AC
  Administered 2014-03-18 (×2): 2 g via INTRAVENOUS
  Filled 2014-03-18 (×2): qty 50

## 2014-03-18 MED ORDER — CO Q-10 300 MG PO CAPS: 300.0000 mg | ORAL_CAPSULE | Freq: Every day | ORAL | Status: DC

## 2014-03-18 MED ORDER — FERROUS SULFATE 325 (65 FE) MG PO TABS
325.0000 mg | ORAL_TABLET | Freq: Two times a day (BID) | ORAL | Status: DC
  Filled 2014-03-18 (×7): qty 1

## 2014-03-18 MED ORDER — ALUM & MAG HYDROXIDE-SIMETH 200-200-20 MG/5ML PO SUSP: 30.0000 mL | ORAL | Status: DC | PRN

## 2014-03-18 MED ORDER — METOCLOPRAMIDE HCL 10 MG PO TABS
5.0000 mg | ORAL_TABLET | Freq: Three times a day (TID) | ORAL | Status: DC | PRN
  Administered 2014-03-19: 10 mg via ORAL
  Filled 2014-03-18: qty 1

## 2014-03-18 MED ORDER — ONDANSETRON HCL 4 MG PO TABS
4.0000 mg | ORAL_TABLET | Freq: Four times a day (QID) | ORAL | Status: DC | PRN
  Administered 2014-03-19: 4 mg via ORAL
  Filled 2014-03-18: qty 1

## 2014-03-18 SURGICAL SUPPLY — 47 items
BLADE SAW SGTL 18X1.27X75 (BLADE) ×2 IMPLANT
BLADE SURG ROTATE 9660 (MISCELLANEOUS) IMPLANT
CAPT HIP PF COP ×2 IMPLANT
CELLS DAT CNTRL 66122 CELL SVR (MISCELLANEOUS) ×1 IMPLANT
COVER PERINEAL POST (MISCELLANEOUS) ×2 IMPLANT
COVER SURGICAL LIGHT HANDLE (MISCELLANEOUS) ×2 IMPLANT
DRAPE C-ARM 42X72 X-RAY (DRAPES) ×2 IMPLANT
DRAPE STERI IOBAN 125X83 (DRAPES) ×2 IMPLANT
DRAPE U-SHAPE 47X51 STRL (DRAPES) ×6 IMPLANT
DRSG AQUACEL AG ADV 3.5X10 (GAUZE/BANDAGES/DRESSINGS) ×2 IMPLANT
DURAPREP 26ML APPLICATOR (WOUND CARE) ×2 IMPLANT
ELECT BLADE 4.0 EZ CLEAN MEGAD (MISCELLANEOUS)
ELECT CAUTERY BLADE 6.4 (BLADE) ×2 IMPLANT
ELECT REM PT RETURN 9FT ADLT (ELECTROSURGICAL) ×2
ELECTRODE BLDE 4.0 EZ CLN MEGD (MISCELLANEOUS) IMPLANT
ELECTRODE REM PT RTRN 9FT ADLT (ELECTROSURGICAL) ×1 IMPLANT
FACESHIELD WRAPAROUND (MASK) ×4 IMPLANT
GLOVE BIO SURGEON STRL SZ8 (GLOVE) ×6 IMPLANT
GLOVE BIOGEL PI IND STRL 8 (GLOVE) ×1 IMPLANT
GLOVE BIOGEL PI INDICATOR 8 (GLOVE) ×1
GLOVE SS BIOGEL STRL SZ 8 (GLOVE) ×2 IMPLANT
GLOVE SUPERSENSE BIOGEL SZ 8 (GLOVE) ×2
GOWN STRL REUS W/ TWL LRG LVL3 (GOWN DISPOSABLE) ×2 IMPLANT
GOWN STRL REUS W/ TWL XL LVL3 (GOWN DISPOSABLE) ×1 IMPLANT
GOWN STRL REUS W/TWL LRG LVL3 (GOWN DISPOSABLE) ×2
GOWN STRL REUS W/TWL XL LVL3 (GOWN DISPOSABLE) ×1
KIT BASIN OR (CUSTOM PROCEDURE TRAY) ×2 IMPLANT
KIT ROOM TURNOVER OR (KITS) ×2 IMPLANT
LINER BOOT UNIVERSAL DISP (MISCELLANEOUS) ×2 IMPLANT
MANIFOLD NEPTUNE II (INSTRUMENTS) ×2 IMPLANT
NS IRRIG 1000ML POUR BTL (IV SOLUTION) ×2 IMPLANT
PACK TOTAL JOINT (CUSTOM PROCEDURE TRAY) ×2 IMPLANT
PAD ARMBOARD 7.5X6 YLW CONV (MISCELLANEOUS) ×4 IMPLANT
RTRCTR WOUND ALEXIS 18CM MED (MISCELLANEOUS) ×2
STAPLER VISISTAT 35W (STAPLE) ×2 IMPLANT
SUT ETHIBOND NAB CT1 #1 30IN (SUTURE) ×4 IMPLANT
SUT VIC AB 0 CT1 27 (SUTURE)
SUT VIC AB 0 CT1 27XBRD ANBCTR (SUTURE) IMPLANT
SUT VIC AB 1 CT1 27 (SUTURE) ×1
SUT VIC AB 1 CT1 27XBRD ANBCTR (SUTURE) ×1 IMPLANT
SUT VIC AB 2-0 CT1 27 (SUTURE) ×1
SUT VIC AB 2-0 CT1 TAPERPNT 27 (SUTURE) ×1 IMPLANT
SUT VLOC 180 0 24IN GS25 (SUTURE) ×2 IMPLANT
TOWEL OR 17X24 6PK STRL BLUE (TOWEL DISPOSABLE) ×2 IMPLANT
TOWEL OR 17X26 10 PK STRL BLUE (TOWEL DISPOSABLE) ×4 IMPLANT
TRAY FOLEY CATH 14FR (SET/KITS/TRAYS/PACK) IMPLANT
WATER STERILE IRR 1000ML POUR (IV SOLUTION) ×4 IMPLANT

## 2014-03-18 NOTE — Progress Notes (Signed)
PHARMACIST - PHYSICIAN ORDER COMMUNICATION  CONCERNING: P&T Medication Policy on Herbal Medications  DESCRIPTION:  This patient's order for: Coenzyme Q-10  has been noted.  This product(s) is classified as an "herbal" or natural product. Due to a lack of definitive safety studies or FDA approval, nonstandard manufacturing practices, plus the potential risk of unknown drug-drug interactions while on inpatient medications, the Pharmacy and Therapeutics Committee does not permit the use of "herbal" or natural products of this type within [facility].   ACTION TAKEN: The pharmacy department is unable to verify this order at this time and your patient has been informed of this safety policy. Please reevaluate patient's clinical condition at discharge and address if the herbal or natural product(s) should be resumed at that time.  Alycia Rossetti, PharmD, BCPS Clinical Pharmacist Pager: [phone] 03/18/2014 1:00 PM

## 2014-03-18 NOTE — Anesthesia Procedure Notes (Signed)
Procedure Name: Intubation Date/Time: 03/18/2014 7:47 AM Performed by: Storm Frisk E Pre-anesthesia Checklist: Patient identified, Timeout performed, Emergency Drugs available, Suction available and Patient being monitored Patient Re-evaluated:Patient Re-evaluated prior to inductionOxygen Delivery Method: Circle system utilized Preoxygenation: Pre-oxygenation with 100% oxygen Intubation Type: Cricoid Pressure applied and Combination inhalational/ intravenous induction Ventilation: Mask ventilation without difficulty and Oral airway inserted - appropriate to patient size Laryngoscope Size: Mac and 3 Grade View: Grade III Tube type: Oral Tube size: 7.5 mm Number of attempts: 4 Airway Equipment and Method: Stylet,  Bougie stylet and Oral airway Placement Confirmation: ETT inserted through vocal cords under direct vision,  breath sounds checked- equal and bilateral and positive ETCO2 Secured at: 22 cm Tube secured with: Tape Dental Injury: Injury to lip  Difficulty Due To: Difficulty was unanticipated and Difficult Airway- due to anterior larynx Future Recommendations: Recommend- induction with short-acting agent, and alternative techniques readily available Comments: DL x 1 by Storm Frisk, CRNA with Mac 3- Grade III View as only posterior arytenoid was seen and vocal cords were not visualized.  ETT was unable to be passed.  DL x 1 by Dr. Ermalene Postin with Mac 3 yield same view and unsuccessful intubation.  Dr. Ermalene Postin attempted DL x 1 with Sabra Heck 2 and unable to reach glottis.  Dr. Ermalene Postin attempted intubation with Mac 4 and esophageal intubation occurred, immediately recognized, and ETT removed.  Pt. Mask ventilated as O2 sats began to decline.  DL again by Dr. Ermalene Postin with Bougie with Mac 4 yielded successful intubation.  +EtCO2, B/L breath sounds and chest rise.  Glidescope was on standby at this point.  Small laceration to left upper lip.  Pt, had extremely deep, anterior airway with TMD 2 cm.

## 2014-03-18 NOTE — Anesthesia Preprocedure Evaluation (Addendum)
Anesthesia Evaluation  Patient identified by MRN, date of birth, ID band Patient awake    Reviewed: Allergy & Precautions, H&P , NPO status , Patient's Chart, lab work & pertinent test results  History of Anesthesia Complications (+) PONV and history of anesthetic complications  Airway Mallampati: II TM Distance: <3 FB Neck ROM: Full    Dental  (+) Teeth Intact, Dental Advisory Given   Pulmonary asthma , neg sleep apnea, neg COPD         Cardiovascular negative cardio ROS  Rhythm:Regular     Neuro/Psych PSYCHIATRIC DISORDERS Depression negative neurological ROS     GI/Hepatic Neg liver ROS, hiatal hernia, GERD-  Medicated and Controlled,  Endo/Other  negative endocrine ROS  Renal/GU negative Renal ROS     Musculoskeletal   Abdominal   Peds  Hematology  (+) Blood dyscrasia, anemia ,   Anesthesia Other Findings   Reproductive/Obstetrics                          Anesthesia Physical Anesthesia Plan  ASA: II  Anesthesia Plan: General   Post-op Pain Management:    Induction: Intravenous  Airway Management Planned: Oral ETT  Additional Equipment: None  Intra-op Plan:   Post-operative Plan: Extubation in OR  Informed Consent: I have reviewed the patients History and Physical, chart, labs and discussed the procedure including the risks, benefits and alternatives for the proposed anesthesia with the patient or authorized representative who has indicated his/her understanding and acceptance.   Dental advisory given  Plan Discussed with: CRNA, Anesthesiologist and Surgeon  Anesthesia Plan Comments:        Anesthesia Quick Evaluation

## 2014-03-18 NOTE — Progress Notes (Signed)
Utilization Review Completed.Dowell, Kimberly Lowery  

## 2014-03-18 NOTE — Interval H&P Note (Signed)
History and Physical Interval Note:  03/18/2014 7:24 AM  Kimberly Lowery  has presented today for surgery, with the diagnosis of RIGHT HIP DEGENERATIVE JOINT DISEASE  The various methods of treatment have been discussed with the patient and family. After consideration of risks, benefits and other options for treatment, the patient has consented to  Procedure(s): RIGHT TOTAL HIP ARTHROPLASTY ANTERIOR APPROACH (Right) as a surgical intervention .  The patient's history has been reviewed, patient examined, no change in status, stable for surgery.  I have reviewed the patient's chart and labs.  Questions were answered to the patient's satisfaction.     Juliona Vales G

## 2014-03-18 NOTE — Op Note (Signed)
PRE-OP DIAGNOSIS:  RIGHT HIP DEGENERATIVE JOINT DISEASE POST-OP DIAGNOSIS:  same PROCEDURE: RIGHT TOTAL HIP ARTHROPLASTY ANTERIOR APPROACH ANESTHESIA:  General SURGEON:  Melrose Nakayama MD ASSISTANT:  Loni Dolly PA-C   INDICATIONS FOR PROCEDURE:  The patient is a 68 y.o. female with a long history of a painful hip.  This has persisted despite multiple conservative measures.  The patient has persisted with pain and dysfunction making rest and activity difficult.  A total hip replacement is offered as surgical treatment.  Informed operative consent was obtained after discussion of possible complications including reaction to anesthesia, infection, neurovascular injury, dislocation, DVT, PE, and death.  The importance of the postoperative rehab program to optimize result was stressed with the patient.  SUMMARY OF FINDINGS AND PROCEDURE:  Under general anesthesia through a anterior approach an the Hana table a right THR was performed.  The patient had severe degenerative change and good bone quality.  We used DePuy components to replace the hip and these were size KA 10 Corail femur capped with a +1 50mm ceramic hip ball.  On the acetabular side we used a size 50 Gription shell with a  plus 4 neutral polyethylene liner.  We did use a hole eliminator.  Loni Dolly PA-C assisted throughout and was invaluable to the completion of the case in that he helped position and retract while I performed the procedure.  He also closed simultaneously to help minimize OR time.  I used fluoroscopy throughout the case to check position of implants and leg lengths and read all of these views myself.  DESCRIPTION OF PROCEDURE:  The patient was taken to the OR suite where general anesthetic was applied.  The patient was then positioned on the Hana table supine.  All bony prominences were appropriately padded.  Prep and drape was then performed in normal sterile fashion.  The patient was given Kefzol preoperative antibiotic and  an appropriate time out was performed.  We then took an anterior approach to the right hip.  Dissection was taken through adipose to the tensor fascia lata fascia.  This structure was incised longitudinally and we dissected in the intermuscular interval just medial to this muscle.  Cobra retractors were placed superior and inferior to the femoral neck superficial to the capsule.  A capsular incision was then made and the retractors were placed along the femoral neck.  Xray was brought in to get a good level for the femoral neck cut which was made with an oscillating saw and osteotome.  The femoral head was removed with a corkscrew.  The acetabulum was exposed and some labral tissues were excised. Reaming was taken to the inside wall of the pelvis and sequentially up to 1 mm smaller than the actual component.  A trial of components was done and then the aforementioned acetabular shell was placed in appropriate tilt and anteversion confirmed by fluoroscopy. The liner was placed along with the hole eliminator and attention was turned to the femur.  The leg was brought down and over into adduction and the elevator bar was used to raise the femur up gently in the wound.  The piriformis was released with care taken to preserve the obturator internus attachment and all of the posterior capsule. The femur was reamed and then broached to the appropriate size.  A trial reduction was done and the aforementioned head and neck assembly gave [place] the best stability in extension with external rotation.  Leg lengths were felt to be about equal by fluoroscopic exam.  The trial components were removed and the wound irrigated.  We then placed the femoral component in appropriate anteversion.  The head was applied to a dry stem neck and the hip again reduced.  It was again stable in the aforementioned position.  The would was irrigated again followed by re-approximation of anterior capsule with ethibond suture. Tensor fascia was repaired  with V-loc suture  followed by subcutaneous closure with #O and #2 undyed vicryl.  Skin was closed with staples followed by a sterile dressing.  EBL and IOF can be obtained from anesthesia records.  DISPOSITION:  The patient was extubated in the OR and taken to PACU in stable condition to be admitted to the Orthopedic Surgery for appropriate post-op care to include perioperative antibiotics and DVT prophylaxis.

## 2014-03-18 NOTE — Evaluation (Signed)
Physical Therapy Evaluation Patient Details Name: Kimberly Lowery MRN: [id number] DOB: 01-29-1946 Today's Date: 03/18/2014   History of Present Illness  S/P R THA , anterior approach  Clinical Impression  Pt admitted with/for elective THA R.  Pt currently limited functionally due to the problems listed below.  (see problems list.)  Pt will benefit from PT to maximize function and safety to be able to get home safely with available assist of family.     Follow Up Recommendations Home health PT;Supervision for mobility/OOB    Equipment Recommendations  None recommended by PT    Recommendations for Other Services       Precautions / Restrictions Precautions Precautions: Anterior Hip Precaution Comments: ant. precautions given to patient and reinforced. Restrictions Weight Bearing Restrictions: Yes RLE Weight Bearing: Weight bearing as tolerated      Mobility  Bed Mobility Overal bed mobility: Needs Assistance Bed Mobility: Supine to Sit     Supine to sit: Min assist     General bed mobility comments: cues for sequencing and technique; minimal assist due to pain  Transfers Overall transfer level: Needs assistance Equipment used: Rolling walker (2 wheeled) Transfers: Sit to/from Stand Sit to Stand: Min assist         General transfer comment: cues for technique, steady assist  Ambulation/Gait Ambulation/Gait assistance: Min guard Ambulation Distance (Feet): 6 Feet Assistive device: Rolling walker (2 wheeled) Gait Pattern/deviations: Step-through pattern     General Gait Details: sequencing well, mildly antalgic, but steady  Stairs            Wheelchair Mobility    Modified Rankin (Stroke Patients Only)       Balance Overall balance assessment: No apparent balance deficits (not formally assessed)                                           Pertinent Vitals/Pain     Home Living Family/patient expects to be discharged to::  Private residence Living Arrangements: Spouse/significant other Available Help at Discharge: Family;Available 24 hours/day Type of Home: House Home Access: Stairs to enter Entrance Stairs-Rails: Psychiatric nurse of Steps: 2 Home Layout: One level Home Equipment: Walker - 2 wheels;Shower seat;Wheelchair - manual (riser with arms)      Prior Function Level of Independence: Independent               Hand Dominance        Extremity/Trunk Assessment               Lower Extremity Assessment: RLE deficits/detail;LLE deficits/detail RLE Deficits / Details: moves into ~90* flexion actively,  LLE Deficits / Details: WFL     Communication   Communication: No difficulties  Cognition Arousal/Alertness: Awake/alert Behavior During Therapy: WFL for tasks assessed/performed Overall Cognitive Status: Within Functional Limits for tasks assessed                      General Comments      Exercises Total Joint Exercises Ankle Circles/Pumps: AROM;Both;15 reps;Supine Heel Slides: AROM;AAROM;Both;10 reps;Supine      Assessment/Plan    PT Assessment Patient needs continued PT services  PT Diagnosis Acute pain   PT Problem List Decreased strength;Decreased mobility;Decreased knowledge of use of DME;Decreased knowledge of precautions;Pain  PT Treatment Interventions DME instruction;Gait training;Stair training;Functional mobility training;Therapeutic activities;Therapeutic exercise;Patient/family education   PT Goals (Current goals can be  found in the Care Plan section) Acute Rehab PT Goals Patient Stated Goal: back Independent PT Goal Formulation: With patient Time For Goal Achievement: 03/25/14 Potential to Achieve Goals: Good    Frequency 7X/week   Barriers to discharge        Co-evaluation               End of Session   Activity Tolerance: Patient tolerated treatment well Patient left: in chair;with family/visitor present Nurse  Communication: Mobility status;Precautions         Time: 0929-5747 PT Time Calculation (min): 28 min   Charges:   PT Evaluation $Initial PT Evaluation Tier I: 1 Procedure PT Treatments $Gait Training: 8-22 mins   PT G Codes:          Mottinger, Tessie Fass 03/18/2014, 5:21 PM 03/18/2014  Donnella Sham, PT [phone]  (pager)

## 2014-03-18 NOTE — Transfer of Care (Signed)
Immediate Anesthesia Transfer of Care Note  Patient: Kimberly Lowery  Procedure(s) Performed: Procedure(s): RIGHT TOTAL HIP ARTHROPLASTY ANTERIOR APPROACH (Right)  Patient Location: PACU  Anesthesia Type:General  Level of Consciousness: lethargic and responds to stimulation  Airway & Oxygen Therapy: Patient Spontanous Breathing and Patient connected to nasal cannula oxygen  Post-op Assessment: Report given to PACU RN and Post -op Vital signs reviewed and stable  Post vital signs: Reviewed and stable  Complications: No apparent anesthesia complications

## 2014-03-19 ENCOUNTER — Encounter (HOSPITAL_COMMUNITY): Payer: Self-pay | Admitting: Orthopaedic Surgery

## 2014-03-19 LAB — CBC
HCT: 30 % — ABNORMAL LOW (ref 36.0–46.0)
Hemoglobin: 9.7 g/dL — ABNORMAL LOW (ref 12.0–15.0)
MCH: 29.2 pg (ref 26.0–34.0)
MCHC: 32.3 g/dL (ref 30.0–36.0)
MCV: 90.4 fL (ref 78.0–100.0)
PLATELETS: 206 10*3/uL (ref 150–400)
RBC: 3.32 MIL/uL — AB (ref 3.87–5.11)
RDW: 14.8 % (ref 11.5–15.5)
WBC: 9.5 10*3/uL (ref 4.0–10.5)

## 2014-03-19 LAB — BASIC METABOLIC PANEL
BUN: 9 mg/dL (ref 6–23)
CHLORIDE: 101 meq/L (ref 96–112)
CO2: 25 mEq/L (ref 19–32)
CREATININE: 0.6 mg/dL (ref 0.50–1.10)
Calcium: 8.4 mg/dL (ref 8.4–10.5)
GFR calc Af Amer: >90 mL/min (ref >90)
GFR calc non Af Amer: >90 mL/min (ref >90)
Glucose, Bld: 131 mg/dL — ABNORMAL HIGH (ref 70–99)
Potassium: 4.2 mEq/L (ref 3.7–5.3)
Sodium: 141 mEq/L (ref 137–147)

## 2014-03-19 MED ORDER — TRAMADOL HCL 50 MG PO TABS
50.0000 mg | ORAL_TABLET | Freq: Four times a day (QID) | ORAL | Status: DC | PRN
  Administered 2014-03-19: 100 mg via ORAL
  Filled 2014-03-19: qty 2

## 2014-03-19 MED ORDER — PROMETHAZINE HCL 25 MG/ML IJ SOLN: 12.5000 mg | Freq: Four times a day (QID) | INTRAMUSCULAR | Status: DC | PRN

## 2014-03-19 MED ORDER — PROMETHAZINE HCL 12.5 MG PO TABS: 12.5000 mg | ORAL_TABLET | Freq: Four times a day (QID) | ORAL | Status: DC | PRN

## 2014-03-19 NOTE — Progress Notes (Signed)
Subjective: 1 Day Post-Op Procedure(s) (LRB): RIGHT TOTAL HIP ARTHROPLASTY ANTERIOR APPROACH (Right)  Activity level:  wbat Diet tolerance:  Eating well Voiding:  ok Patient reports pain as mild and moderate.    Objective: Vital signs in last 24 hours: Temp:  [97.6 F (36.4 C)-98.4 F (36.9 C)] 97.8 F (36.6 C) (06/17 0449) Pulse Rate:  [71-94] 82 (06/17 0449) Resp:  [8-17] 14 (06/17 0449) BP: (108-174)/(63-114) 108/76 mmHg (06/17 0449) SpO2:  [92 %-100 %] 92 % (06/17 0449)  Labs:  Recent Labs  03/19/14 0532  HGB 9.7*    Recent Labs  03/19/14 0532  WBC 9.5  RBC 3.32*  HCT 30.0*  PLT 206    Recent Labs  03/19/14 0532  NA 141  K 4.2  CL 101  CO2 25  BUN 9  CREATININE 0.60  GLUCOSE 131*  CALCIUM 8.4   No results found for this basename: LABPT, INR,  in the last 72 hours  Physical Exam:  Neurologically intact ABD soft Neurovascular intact Sensation intact distally Intact pulses distally Dorsiflexion/Plantar flexion intact Incision: scant drainage No cellulitis present Compartment soft  Assessment/Plan:  1 Day Post-Op Procedure(s) (LRB): RIGHT TOTAL HIP ARTHROPLASTY ANTERIOR APPROACH (Right) Advance diet Up with therapy Plan for discharge tomorrow Discharge home with home health If PT thinks it is safe to go home.  Continue on ASA 325mg  BID x 4 weeks Follow up in office in 2 weeks     NIDA, [name] 03/19/2014, 7:57 AM

## 2014-03-19 NOTE — Clinical Social Work Note (Signed)
Referred to this CSW today for ?SNF. Chart reviewed and have spoken with RNCM who indicates patient plans to d/c home with [facility] and DME. Noted PT also recommending home with HH/DME.  CSW to sign off- please contact us if SW needs arise. Eduard Clos, MSW, [place]

## 2014-03-19 NOTE — Progress Notes (Signed)
Physical Therapy Treatment Patient Details Name: Kimberly Lowery MRN: [id number] DOB: 15-May-1946 Today's Date: 03/19/2014    History of Present Illness S/P R THA , anterior approach    PT Comments    Patient progressing well but limited slightly by pain. Will attempt to increase ambulation this afternoon  Follow Up Recommendations  Home health PT;Supervision for mobility/OOB     Equipment Recommendations  None recommended by PT    Recommendations for Other Services       Precautions / Restrictions Precautions Precautions: None Precaution Comments: Patient is direct anterior approach. No precautions Restrictions Weight Bearing Restrictions: Yes RLE Weight Bearing: Weight bearing as tolerated    Mobility  Bed Mobility Overal bed mobility: Needs Assistance       Supine to sit: Min assist     General bed mobility comments: A for R leg due to pain  Transfers Overall transfer level: Needs assistance Equipment used: Rolling walker (2 wheeled)   Sit to Stand: Min assist         General transfer comment: cues for technique, steady assist  Ambulation/Gait Ambulation/Gait assistance: Min guard Ambulation Distance (Feet): 40 Feet Assistive device: Rolling walker (2 wheeled) Gait Pattern/deviations: Step-through pattern;Decreased stride length   Gait velocity interpretation: <1.8 ft/sec, indicative of risk for recurrent falls General Gait Details: sequencing well, mildly antalgic, but steady   Science writer    Modified Rankin (Stroke Patients Only)       Balance                                    Cognition Arousal/Alertness: Awake/alert Behavior During Therapy: WFL for tasks assessed/performed Overall Cognitive Status: Within Functional Limits for tasks assessed                      Exercises Total Joint Exercises Heel Slides: AAROM;Right;10 reps Hip ABduction/ADduction: AAROM;Right;10 reps Long  Arc Quad: AROM;Right;10 reps    General Comments        Pertinent Vitals/Pain 7/10 radiating R leg pain. RN provided medication to assist with pain control     Home Living                      Prior Function            PT Goals (current goals can now be found in the care plan section) Progress towards PT goals: Progressing toward goals    Frequency  7X/week    PT Plan Current plan remains appropriate    Co-evaluation             End of Session Equipment Utilized During Treatment: Gait belt Activity Tolerance: Patient tolerated treatment well Patient left: in chair     Time: 0626-9485 PT Time Calculation (min): 23 min  Charges:  $Gait Training: 8-22 mins $Therapeutic Exercise: 8-22 mins                    G Codes:      Jacqualyn Posey 03/19/2014, 12:00 PM 03/19/2014 Jacqualyn Posey PTA [phone] pager [phone] office

## 2014-03-19 NOTE — Progress Notes (Signed)
PT Cancellation Note  Patient Details Name: Kimberly Lowery MRN: [id number] DOB: 1946-09-30   Cancelled Treatment:    Reason Eval/Treat Not Completed: Pain limiting ability to participate . Patient with complaints of increased pain in hip and headache. Requested to hold this afternoon. Will follow up in AM   Robinette, Tonia Brooms 03/19/2014, 2:53 PM

## 2014-03-19 NOTE — Progress Notes (Signed)
OT Cancellation Note  Patient Details Name: Kimberly Lowery MRN: [id number] DOB: 1946/08/09   Cancelled Treatment:    Reason Eval/Treat Not Completed: Pain limiting ability to participate. OT to reattempt tomorrow.  Hortencia Pilar 03/19/2014, 3:50 PM

## 2014-03-19 NOTE — Plan of Care (Signed)
Problem: Consults Goal: Diagnosis- Total Joint Replacement Primary Total Hip Right     

## 2014-03-19 NOTE — Anesthesia Postprocedure Evaluation (Signed)
  Anesthesia Post-op Note  Patient: Kimberly Lowery  Procedure(s) Performed: Procedure(s): RIGHT TOTAL HIP ARTHROPLASTY ANTERIOR APPROACH (Right)  Patient Location: PACU  Anesthesia Type:General  Level of Consciousness: awake and alert   Airway and Oxygen Therapy: Patient Spontanous Breathing  Post-op Pain: mild  Post-op Assessment: Post-op Vital signs reviewed, Patient's Cardiovascular Status Stable, Respiratory Function Stable, Patent Airway, No signs of Nausea or vomiting and Pain level controlled  Post-op Vital Signs: Reviewed and stable  Last Vitals:  Filed Vitals:   03/19/14 0449  BP: 108/76  Pulse: 82  Temp: 36.6 C  Resp: 14    Complications: No apparent anesthesia complications

## 2014-03-19 NOTE — Progress Notes (Signed)
Patient continues to have nausea and a headache that have persisted throughout the day.  Patient indicates that current medications for pain and nausea have not been effective.  On call Loni Dolly, PA contacted.  New orders for Phenergan 12.5 mg PO or IV every 6 hours prn received.  Will continue to monitor patient.

## 2014-03-20 LAB — CBC
HEMATOCRIT: 29.7 % — AB (ref 36.0–46.0)
Hemoglobin: 9.4 g/dL — ABNORMAL LOW (ref 12.0–15.0)
MCH: 29 pg (ref 26.0–34.0)
MCHC: 31.6 g/dL (ref 30.0–36.0)
MCV: 91.7 fL (ref 78.0–100.0)
Platelets: 191 10*3/uL (ref 150–400)
RBC: 3.24 MIL/uL — AB (ref 3.87–5.11)
RDW: 14.5 % (ref 11.5–15.5)
WBC: 9.9 10*3/uL (ref 4.0–10.5)

## 2014-03-20 MED ORDER — HYDROCODONE-ACETAMINOPHEN 5-325 MG PO TABS: 1.0000 | ORAL_TABLET | ORAL | Status: DC | PRN

## 2014-03-20 MED ORDER — METHOCARBAMOL 500 MG PO TABS: 500.0000 mg | ORAL_TABLET | Freq: Four times a day (QID) | ORAL | Status: DC | PRN

## 2014-03-20 MED ORDER — ASPIRIN 325 MG PO TBEC: 325.0000 mg | DELAYED_RELEASE_TABLET | Freq: Two times a day (BID) | ORAL | Status: DC

## 2014-03-20 NOTE — Discharge Summary (Signed)
Patient ID: Kimberly Lowery MRN: [id number] DOB/AGE: 04-03-1946 68 y.o.  Admit date: 03/18/2014 Discharge date: 03/20/2014  Admission Diagnoses:  Principal Problem:   Degenerative joint disease (DJD) of hip   Discharge Diagnoses:  Same  Past Medical History  Diagnosis Date  . PONV (postoperative nausea and vomiting)   . Asthma   . Depression   . GERD (gastroesophageal reflux disease)   . H/O hiatal hernia   . Anemia   . Blood dyscrasia     grandfather had hx bone marrow Ca  . Family history of anesthesia complication     "my aunt didn't go out during OR"  . High cholesterol   . Heart murmur     no problem, stated a bruit in right carotid  . Sleep apnea     "never RX mask" (03/18/2014)  . Arthritis     "right hip" (03/18/2014)  . Chronic lower back pain   . Anxiety     Surgeries: Procedure(s): RIGHT TOTAL HIP ARTHROPLASTY ANTERIOR APPROACH on 03/18/2014   Consultants:    Discharged Condition: Improved  Hospital Course: Kimberly Lowery is an 68 y.o. female who was admitted 03/18/2014 for operative treatment ofDegenerative joint disease (DJD) of hip. Patient has severe unremitting pain that affects sleep, daily activities, and work/hobbies. After pre-op clearance the patient was taken to the operating room on 03/18/2014 and underwent  Procedure(s): RIGHT TOTAL HIP ARTHROPLASTY ANTERIOR APPROACH.    Patient was given perioperative antibiotics: Anti-infectives   Start     Dose/Rate Route Frequency Ordered Stop   03/18/14 1400  ceFAZolin (ANCEF) IVPB 2 g/50 mL premix     2 g 100 mL/hr over 30 Minutes Intravenous Every 6 hours 03/18/14 1221 03/18/14 2130   03/18/14 0600  ceFAZolin (ANCEF) IVPB 2 g/50 mL premix     2 g 100 mL/hr over 30 Minutes Intravenous On call to O.R. 03/17/14 1405 03/18/14 0732       Patient was given sequential compression devices, early ambulation, and chemoprophylaxis to prevent DVT.  Patient benefited maximally from hospital stay and there were  no complications.    Recent vital signs: Patient Vitals for the past 24 hrs:  BP Temp Temp src Pulse Resp SpO2  03/20/14 0524 133/64 mmHg 97.9 F (36.6 C) Oral 93 16 92 %  03/20/14 0400 - - - - 16 92 %  03/20/14 0000 - - - - 16 92 %  03/19/14 2317 - 99.4 F (37.4 C) - - - -  03/19/14 2154 146/70 mmHg 98.4 F (36.9 C) Oral 114 16 91 %  03/19/14 2000 - - - - 16 92 %  03/19/14 1901 - 101.2 F (38.4 C) - - - -  03/19/14 1434 137/67 mmHg 100.1 F (37.8 C) Oral 99 14 93 %     Recent laboratory studies:  Recent Labs  03/19/14 0532 03/20/14 0520  WBC 9.5 9.9  HGB 9.7* 9.4*  HCT 30.0* 29.7*  PLT 206 191  NA 141  --   K 4.2  --   CL 101  --   CO2 25  --   BUN 9  --   CREATININE 0.60  --   GLUCOSE 131*  --   CALCIUM 8.4  --      Discharge Medications:     Medication List    STOP taking these medications       aspirin 81 MG tablet  Replaced by:  aspirin 325 MG EC tablet     celecoxib  200 MG capsule  Commonly known as:  CELEBREX      TAKE these medications       aspirin 325 MG EC tablet  Take 1 tablet (325 mg total) by mouth 2 (two) times daily after a meal.     Cholecalciferol 4000 UNITS Caps  Take 4,000 Units by mouth daily.     clindamycin 1 % lotion  Commonly known as:  CLEOCIN T  Apply 1 application topically daily. To rosacea     Co Q-10 300 MG Caps  Take 300 mg by mouth daily.     desonide 0.05 % ointment  Commonly known as:  DESOWEN  Apply 1 application topically daily as needed (rash on neck).     FLAX SEED OIL PO  Take 1 tablet by mouth daily.     FLUoxetine 20 MG capsule  Commonly known as:  PROZAC  Take 20 mg by mouth daily.     HYDROcodone-acetaminophen 5-325 MG per tablet  Commonly known as:  NORCO/VICODIN  Take 1-2 tablets by mouth every 4 (four) hours as needed for moderate pain (breakthrough pain).     methocarbamol 500 MG tablet  Commonly known as:  ROBAXIN  Take 1 tablet (500 mg total) by mouth every 6 (six) hours as needed for  muscle spasms.     omeprazole 20 MG tablet  Commonly known as:  PRILOSEC OTC  Take 20 mg by mouth daily.     rosuvastatin 10 MG tablet  Commonly known as:  CRESTOR  Take 10 mg by mouth See admin instructions. 5 times weekly .Marland Kitchen Mon-Friday        Diagnostic Studies: Dg Chest 2 View  03/07/2014   CLINICAL DATA:  Preoperative evaluation, for right hip replacement  EXAM: CHEST  2 VIEW  COMPARISON:  01/28/2009  FINDINGS: Normal heart size and vascularity. Chronic right midlung subpleural scarring. Lungs remain clear. No focal pneumonia, collapse or consolidation. No edema, effusion or pneumothorax. Trachea is midline. Prior cholecystectomy noted.  IMPRESSION: Stable chest exam.  No superimposed acute process   Electronically Signed   By: Daryll Brod M.D.   On: 03/07/2014 13:44   Dg Hip Operative Right  03/18/2014   CLINICAL DATA:  Total hip arthroplasty anterior approach  EXAM: DG OPERATIVE RIGHT HIP  TECHNIQUE: Multiple fluoroscopic images of the right hip is submitted.  COMPARISON:  None.  FINDINGS: Total hip replacement on the right identified with femoral and acetabular components in anticipated position.  FLUOROSCOPY TIME:  0 min 11 seconds  IMPRESSION: Hip arthroplasty   Electronically Signed   By: Skipper Cliche M.D.   On: 03/18/2014 09:35    Disposition:       Discharge Instructions   Call MD / Call 911    Complete by:  As directed   If you experience chest pain or shortness of breath, CALL 911 and be transported to the hospital emergency room.  If you develope a fever above 101 F, pus (white drainage) or increased drainage or redness at the wound, or calf pain, call your surgeon's office.     Constipation Prevention    Complete by:  As directed   Drink plenty of fluids.  Prune juice may be helpful.  You may use a stool softener, such as Colace (over the counter) 100 mg twice a day.  Use MiraLax (over the counter) for constipation as needed.     Diet - low sodium heart healthy     Complete by:  As directed  Increase activity slowly as tolerated    Complete by:  As directed            Follow-up Information   Follow up with DALLDORF,PETER G, MD. Call in 2 weeks.   Specialty:  Orthopedic Surgery   Contact information:   [place] [address] [id number] [phone]        Signed: Rich Fuchs 03/20/2014, 12:29 PM

## 2014-03-20 NOTE — Progress Notes (Signed)
Physical Therapy Treatment Patient Details Name: Kimberly Lowery MRN: [id number] DOB: 1946-01-22 Today's Date: 03/20/2014    History of Present Illness S/P R THA , anterior approach    PT Comments    Patient progressing much better today with less pain. Able to complete stair training this morning. Patient is waiting to here from her doctor to see if she is going home today. From PT standpoint she is safe to DC home with assistance from her husband  Follow Up Recommendations  Home health PT;Supervision for mobility/OOB     Equipment Recommendations  None recommended by PT    Recommendations for Other Services       Precautions / Restrictions Precautions Precautions: None Precaution Comments: Patient is direct anterior approach. No precautions Restrictions Weight Bearing Restrictions: Yes RLE Weight Bearing: Weight bearing as tolerated    Mobility  Bed Mobility   Bed Mobility: Sit to Supine       Sit to supine: Min assist   General bed mobility comments: Min A for R LE back into bed  Transfers Overall transfer level: Needs assistance Equipment used: Rolling walker (2 wheeled)   Sit to Stand: Supervision         General transfer comment: Patient with safe technique. Cues for safety  Ambulation/Gait Ambulation/Gait assistance: Supervision Ambulation Distance (Feet): 150 Feet Assistive device: Rolling walker (2 wheeled) Gait Pattern/deviations: Step-through pattern;Decreased stride length Gait velocity: decreased   General Gait Details: sequencing well, mildly antalgic, but steady   Stairs Stairs: Yes Stairs assistance: Min assist Stair Management: Backwards;With walker;No rails Number of Stairs: 4 General stair comments: Patient practiced 2 steps x2 with cues for sequence and A to manage RW  Wheelchair Mobility    Modified Rankin (Stroke Patients Only)       Balance                                    Cognition  Arousal/Alertness: Awake/alert Behavior During Therapy: WFL for tasks assessed/performed Overall Cognitive Status: Within Functional Limits for tasks assessed                      Exercises      General Comments        Pertinent Vitals/Pain no apparent distress     Home Living                      Prior Function            PT Goals (current goals can now be found in the care plan section) Progress towards PT goals: Progressing toward goals    Frequency  7X/week    PT Plan Current plan remains appropriate    Co-evaluation             End of Session Equipment Utilized During Treatment: Gait belt Activity Tolerance: Patient tolerated treatment well Patient left: in bed;with call bell/phone within reach     Time: 1130-1155 PT Time Calculation (min): 25 min  Charges:  $Gait Training: 23-37 mins                    G Codes:      Jacqualyn Posey 03/20/2014, 12:01 PM 03/20/2014 Jacqualyn Posey PTA [phone] pager [phone] office

## 2014-03-20 NOTE — Evaluation (Signed)
I have read and agree with this note.   Time in/out: 11:04-11:28 Total time: 24 minutes ([place] and [place])  [place], OTR/L [phone]

## 2014-03-20 NOTE — Evaluation (Signed)
Occupational Therapy Evaluation and Discharge Patient Details Name: Kimberly Lowery MRN: [id number] DOB: May 03, 1946 Today's Date: 03/20/2014    History of Present Illness S/P R THA , anterior approach   Clinical Impression   PTA pt was independent and now has decreased ROM and functional mobility due to above limiting her independence with ADLs. Educated pt on proper shower transfer technique to built-in shower seat using her grab bars and compensatory strategies for LB dressing. Offer to go over AE for LB bathing and dressing but pt politely declined. Pt will have assistance at home from her husband and therefore no further OT is needed. We will sign off.    Follow Up Recommendations  No OT follow up;Supervision - Intermittent          Precautions / Restrictions Precautions Precautions: None Precaution Comments: Patient is direct anterior approach. No precautions Restrictions RLE Weight Bearing: Weight bearing as tolerated      Mobility Bed Mobility Overal bed mobility: Needs Assistance Bed Mobility: Supine to sit      Supine to sit: Min guard   General bed mobility comments: VC for hand placement but no physical assistance needed.  Transfers Overall transfer level: Needs assistance Equipment used: Rolling walker (2 wheeled) Transfers: Sit to/from Stand Sit to Stand: Supervision         General transfer comment: Patient with safe technique. Cues for safety and to extend R leg while sitting for comfort.    Balance Overall balance assessment: Needs assistance   Sitting balance-Leahy Scale: Good     Standing balance support: Single extremity supported;During functional activity Standing balance-Leahy Scale: Fair Standing balance comment: using RW for mobility                            ADL Overall ADL's : Needs assistance/impaired Eating/Feeding: Independent;Sitting   Grooming: Standing;Supervision/safety   Upper Body Bathing: Sitting;Set up    Lower Body Bathing:  (A for RLE)   Upper Body Dressing : Sitting;Set up   Lower Body Dressing:  (A for RLE)   Toilet Transfer: RW;Ambulation;Grab bars;Min guard   Toileting- Clothing Manipulation and Hygiene: Sit to/from stand;Min guard   Tub/ Shower Transfer: Civil engineer, contracting;Ambulation;Cueing for sequencing;Min guard Tub/Shower Transfer Details (indicate cue type and reason): VC for hand placement and sequencing of steps Functional mobility during ADLs: Min guard;Rolling walker General ADL Comments: Pt demonstrated shower transfer to 3n1 but will have built in shower seat at home with grab bars in shower for additional support. Pt needs assistance with LB dressing and was offered AE to A with that but pt declined saying her husband will help her with that. Pt educated on sequencing of steps into the shower and which foot to enter and exit with.                Pertinent Vitals/Pain No c/o of pain or nausea today.        Extremity/Trunk Assessment Upper Extremity Assessment Upper Extremity Assessment: Overall WFL for tasks assessed   Lower Extremity Assessment Lower Extremity Assessment: Defer to PT evaluation       Communication Communication Communication: No difficulties   Cognition Arousal/Alertness: Awake/alert Behavior During Therapy: WFL for tasks assessed/performed Overall Cognitive Status: Within Functional Limits for tasks assessed                                Home Living Family/patient  expects to be discharged to:: Private residence Living Arrangements: Spouse/significant other Available Help at Discharge: Family;Available 24 hours/day Type of Home: House Home Access: Stairs to enter [employer] of Steps: 2 Entrance Stairs-Rails: Right;Left Home Layout: One level     Bathroom Shower/Tub: Walk-in shower;Door   Bathroom Toilet: Standard (has built up toilet and arm rest)     Home Equipment: Walker - 2 wheels;Wheelchair -  manual;Shower seat - built in;Grab bars - tub/shower          Prior Functioning/Environment Level of Independence: Independent                      OT Goals(Current goals can be found in the care plan section) Acute Rehab OT Goals Patient Stated Goal: back Independent OT Goal Formulation: With patient Time For Goal Achievement: 03/27/14 Potential to Achieve Goals: Good                End of Session Equipment Utilized During Treatment: Gait belt;Rolling walker  Activity Tolerance: Patient tolerated treatment well Patient left: in chair;with call bell/phone within reach   Time:  -    Charges:    G-CodesLyda Perone 04-12-14, 1:47 PM

## 2014-03-21 NOTE — Care Management Note (Signed)
CARE MANAGEMENT NOTE 03/21/2014  Patient:  Kimberly Lowery, Kimberly Lowery   Account Number:  [id number]  Date Initiated:  03/21/2014  Documentation initiated by:  Ricki Miller  Subjective/Objective Assessment:   68 yr old female s/p right hip arthroplasty.     Action/Plan:   Case manager spoke with patient's husband concerning home health needs at discharge. Patient has rolling walker. Choice offered. Referral called to [facility], [place].   Anticipated DC Date:  03/20/2014   Anticipated DC Plan:  [place]  CM consult      PAC Choice  [place]   Choice offered to / List presented to:  C-3 Spouse   DME arranged  3-N-1      DME agency  [employer]     [place] arranged  HH-2 PT      [place].   Status of service:  Completed, signed off Medicare Important Message given?  NA - LOS <3 / Initial given by admissions (If response is "NO", the following Medicare IM given date fields will be blank) Date Medicare IM given:

## 2014-04-09 ENCOUNTER — Other Ambulatory Visit: Payer: Self-pay | Admitting: Dermatology

## 2014-04-10 ENCOUNTER — Encounter (HOSPITAL_COMMUNITY): Payer: Self-pay | Admitting: Orthopaedic Surgery

## 2014-04-10 NOTE — OR Nursing (Signed)
Late entry on 04-10-2014 by Etheleen Mayhew, RN to add surgery end time.

## 2014-04-15 ENCOUNTER — Telehealth: Payer: Self-pay | Admitting: Interventional Cardiology

## 2014-04-15 DIAGNOSIS — I6529 Occlusion and stenosis of unspecified carotid artery: Secondary | ICD-10-CM

## 2014-04-15 NOTE — Telephone Encounter (Signed)
1 year f/u carotid duplex ordered. Message sent to [facility] to schedule.

## 2014-04-15 NOTE — Telephone Encounter (Signed)
New problem   Pt stated she was suppose to have an appt for an [place] for dr Irish Lack that was made last year. Pt need an order in system to sched.

## 2014-04-17 ENCOUNTER — Ambulatory Visit (HOSPITAL_COMMUNITY): Payer: Medicare Other | Attending: Interventional Cardiology | Admitting: Radiology

## 2014-04-17 DIAGNOSIS — I6529 Occlusion and stenosis of unspecified carotid artery: Secondary | ICD-10-CM | POA: Insufficient documentation

## 2014-04-17 NOTE — Progress Notes (Signed)
Carotid Duplex performed. 

## 2014-04-18 NOTE — OR Nursing (Signed)
Late entry for subtype and infection under procedural recordings

## 2014-05-18 ENCOUNTER — Encounter: Payer: Self-pay | Admitting: *Deleted

## 2014-12-03 ENCOUNTER — Other Ambulatory Visit: Payer: Self-pay | Admitting: Dermatology

## 2015-10-07 DIAGNOSIS — M1612 Unilateral primary osteoarthritis, left hip: Secondary | ICD-10-CM | POA: Diagnosis not present

## 2015-11-09 DIAGNOSIS — M545 Low back pain: Secondary | ICD-10-CM | POA: Diagnosis not present

## 2015-11-12 DIAGNOSIS — M7071 Other bursitis of hip, right hip: Secondary | ICD-10-CM | POA: Diagnosis not present

## 2015-11-12 DIAGNOSIS — M7072 Other bursitis of hip, left hip: Secondary | ICD-10-CM | POA: Diagnosis not present

## 2015-11-12 DIAGNOSIS — Z6828 Body mass index (BMI) 28.0-28.9, adult: Secondary | ICD-10-CM | POA: Diagnosis not present

## 2015-12-11 DIAGNOSIS — M47816 Spondylosis without myelopathy or radiculopathy, lumbar region: Secondary | ICD-10-CM | POA: Diagnosis not present

## 2015-12-11 DIAGNOSIS — M545 Low back pain: Secondary | ICD-10-CM | POA: Diagnosis not present

## 2015-12-14 DIAGNOSIS — M545 Low back pain: Secondary | ICD-10-CM | POA: Diagnosis not present

## 2015-12-14 DIAGNOSIS — Z6828 Body mass index (BMI) 28.0-28.9, adult: Secondary | ICD-10-CM | POA: Diagnosis not present

## 2015-12-16 ENCOUNTER — Other Ambulatory Visit: Payer: Self-pay | Admitting: Internal Medicine

## 2015-12-16 DIAGNOSIS — E785 Hyperlipidemia, unspecified: Secondary | ICD-10-CM | POA: Diagnosis not present

## 2015-12-16 DIAGNOSIS — Z79899 Other long term (current) drug therapy: Secondary | ICD-10-CM | POA: Diagnosis not present

## 2015-12-16 DIAGNOSIS — I6522 Occlusion and stenosis of left carotid artery: Secondary | ICD-10-CM | POA: Diagnosis not present

## 2015-12-16 DIAGNOSIS — Z86010 Personal history of colonic polyps: Secondary | ICD-10-CM | POA: Diagnosis not present

## 2015-12-16 DIAGNOSIS — Z0001 Encounter for general adult medical examination with abnormal findings: Secondary | ICD-10-CM | POA: Diagnosis not present

## 2015-12-16 DIAGNOSIS — Z1231 Encounter for screening mammogram for malignant neoplasm of breast: Secondary | ICD-10-CM

## 2015-12-16 DIAGNOSIS — E559 Vitamin D deficiency, unspecified: Secondary | ICD-10-CM | POA: Diagnosis not present

## 2015-12-16 DIAGNOSIS — K219 Gastro-esophageal reflux disease without esophagitis: Secondary | ICD-10-CM | POA: Diagnosis not present

## 2015-12-16 DIAGNOSIS — M6289 Other specified disorders of muscle: Secondary | ICD-10-CM | POA: Diagnosis not present

## 2015-12-16 DIAGNOSIS — F329 Major depressive disorder, single episode, unspecified: Secondary | ICD-10-CM | POA: Diagnosis not present

## 2015-12-16 DIAGNOSIS — J309 Allergic rhinitis, unspecified: Secondary | ICD-10-CM | POA: Diagnosis not present

## 2015-12-16 DIAGNOSIS — K5730 Diverticulosis of large intestine without perforation or abscess without bleeding: Secondary | ICD-10-CM | POA: Diagnosis not present

## 2015-12-28 ENCOUNTER — Other Ambulatory Visit: Payer: Self-pay | Admitting: Interventional Cardiology

## 2015-12-28 ENCOUNTER — Ambulatory Visit: Payer: Self-pay

## 2015-12-28 DIAGNOSIS — I6523 Occlusion and stenosis of bilateral carotid arteries: Secondary | ICD-10-CM

## 2015-12-30 ENCOUNTER — Inpatient Hospital Stay (HOSPITAL_COMMUNITY): Admission: RE | Admit: 2015-12-30 | Payer: Self-pay | Source: Ambulatory Visit

## 2015-12-31 DIAGNOSIS — D225 Melanocytic nevi of trunk: Secondary | ICD-10-CM | POA: Diagnosis not present

## 2015-12-31 DIAGNOSIS — Z808 Family history of malignant neoplasm of other organs or systems: Secondary | ICD-10-CM | POA: Diagnosis not present

## 2015-12-31 DIAGNOSIS — Z86018 Personal history of other benign neoplasm: Secondary | ICD-10-CM | POA: Diagnosis not present

## 2015-12-31 DIAGNOSIS — Z23 Encounter for immunization: Secondary | ICD-10-CM | POA: Diagnosis not present

## 2015-12-31 DIAGNOSIS — L859 Epidermal thickening, unspecified: Secondary | ICD-10-CM | POA: Diagnosis not present

## 2015-12-31 DIAGNOSIS — Z85828 Personal history of other malignant neoplasm of skin: Secondary | ICD-10-CM | POA: Diagnosis not present

## 2015-12-31 DIAGNOSIS — L249 Irritant contact dermatitis, unspecified cause: Secondary | ICD-10-CM | POA: Diagnosis not present

## 2015-12-31 DIAGNOSIS — L719 Rosacea, unspecified: Secondary | ICD-10-CM | POA: Diagnosis not present

## 2016-01-01 ENCOUNTER — Ambulatory Visit
Admission: RE | Admit: 2016-01-01 | Discharge: 2016-01-01 | Disposition: A | Discharge status: Home / Self Care | Payer: PPO | Source: Ambulatory Visit | Attending: Internal Medicine | Admitting: Internal Medicine

## 2016-01-01 DIAGNOSIS — Z1231 Encounter for screening mammogram for malignant neoplasm of breast: Secondary | ICD-10-CM

## 2016-01-04 ENCOUNTER — Ambulatory Visit (HOSPITAL_COMMUNITY)
Admission: RE | Admit: 2016-01-04 | Discharge: 2016-01-04 | Disposition: A | Discharge status: Home / Self Care | Payer: PPO | Source: Ambulatory Visit | Attending: Cardiology | Admitting: Cardiology

## 2016-01-04 DIAGNOSIS — F329 Major depressive disorder, single episode, unspecified: Secondary | ICD-10-CM | POA: Insufficient documentation

## 2016-01-04 DIAGNOSIS — K219 Gastro-esophageal reflux disease without esophagitis: Secondary | ICD-10-CM | POA: Diagnosis not present

## 2016-01-04 DIAGNOSIS — E7800 Pure hypercholesterolemia, unspecified: Secondary | ICD-10-CM | POA: Diagnosis not present

## 2016-01-04 DIAGNOSIS — I6523 Occlusion and stenosis of bilateral carotid arteries: Secondary | ICD-10-CM

## 2016-01-04 DIAGNOSIS — M1612 Unilateral primary osteoarthritis, left hip: Secondary | ICD-10-CM | POA: Diagnosis not present

## 2016-02-04 ENCOUNTER — Other Ambulatory Visit: Payer: Self-pay | Admitting: Orthopaedic Surgery

## 2016-03-10 ENCOUNTER — Encounter (HOSPITAL_COMMUNITY): Payer: Self-pay

## 2016-03-10 ENCOUNTER — Encounter (HOSPITAL_COMMUNITY)
Admission: RE | Admit: 2016-03-10 | Discharge: 2016-03-10 | Disposition: A | Discharge status: Home / Self Care | Payer: PPO | Source: Ambulatory Visit | Attending: Orthopaedic Surgery | Admitting: Orthopaedic Surgery

## 2016-03-10 ENCOUNTER — Ambulatory Visit (HOSPITAL_COMMUNITY)
Admission: RE | Admit: 2016-03-10 | Discharge: 2016-03-10 | Disposition: A | Discharge status: Home / Self Care | Payer: PPO | Source: Ambulatory Visit | Attending: Orthopaedic Surgery | Admitting: Orthopaedic Surgery

## 2016-03-10 ENCOUNTER — Other Ambulatory Visit (HOSPITAL_COMMUNITY): Payer: Self-pay | Admitting: *Deleted

## 2016-03-10 DIAGNOSIS — Z01810 Encounter for preprocedural cardiovascular examination: Secondary | ICD-10-CM | POA: Diagnosis not present

## 2016-03-10 DIAGNOSIS — Z01812 Encounter for preprocedural laboratory examination: Secondary | ICD-10-CM | POA: Diagnosis not present

## 2016-03-10 DIAGNOSIS — Z01818 Encounter for other preprocedural examination: Secondary | ICD-10-CM | POA: Diagnosis not present

## 2016-03-10 HISTORY — DX: Irritable bowel syndrome, unspecified: K58.9

## 2016-03-10 LAB — CBC WITH DIFFERENTIAL/PLATELET
Basophils Absolute: 0 10*3/uL (ref 0.0–0.1)
Basophils Relative: 0 %
EOS PCT: 1 %
Eosinophils Absolute: 0.1 10*3/uL (ref 0.0–0.7)
HCT: 42.1 % (ref 36.0–46.0)
Hemoglobin: 13.2 g/dL (ref 12.0–15.0)
LYMPHS ABS: 2.1 10*3/uL (ref 0.7–4.0)
LYMPHS PCT: 25 %
MCH: 28.8 pg (ref 26.0–34.0)
MCHC: 31.4 g/dL (ref 30.0–36.0)
MCV: 91.7 fL (ref 78.0–100.0)
MONOS PCT: 8 %
Monocytes Absolute: 0.7 10*3/uL (ref 0.1–1.0)
NEUTROS PCT: 66 %
Neutro Abs: 5.7 10*3/uL (ref 1.7–7.7)
PLATELETS: 256 10*3/uL (ref 150–400)
RBC: 4.59 MIL/uL (ref 3.87–5.11)
RDW: 14.8 % (ref 11.5–15.5)
WBC: 8.6 10*3/uL (ref 4.0–10.5)

## 2016-03-10 LAB — URINALYSIS, ROUTINE W REFLEX MICROSCOPIC
BILIRUBIN URINE: NEGATIVE
Glucose, UA: NEGATIVE mg/dL
Hgb urine dipstick: NEGATIVE
KETONES UR: NEGATIVE mg/dL
Leukocytes, UA: NEGATIVE
NITRITE: NEGATIVE
Protein, ur: NEGATIVE mg/dL
SPECIFIC GRAVITY, URINE: 1.018 (ref 1.005–1.030)
pH: 6 (ref 5.0–8.0)

## 2016-03-10 LAB — TYPE AND SCREEN
ABO/RH(D): B POS
ANTIBODY SCREEN: NEGATIVE

## 2016-03-10 LAB — BASIC METABOLIC PANEL
Anion gap: 9 (ref 5–15)
BUN: 8 mg/dL (ref 6–20)
CALCIUM: 9.1 mg/dL (ref 8.9–10.3)
CO2: 25 mmol/L (ref 22–32)
CREATININE: 0.81 mg/dL (ref 0.44–1.00)
Chloride: 106 mmol/L (ref 101–111)
Glucose, Bld: 104 mg/dL — ABNORMAL HIGH (ref 65–99)
Potassium: 3.5 mmol/L (ref 3.5–5.1)
SODIUM: 140 mmol/L (ref 135–145)

## 2016-03-10 LAB — SURGICAL PCR SCREEN
MRSA, PCR: NEGATIVE
STAPHYLOCOCCUS AUREUS: NEGATIVE

## 2016-03-10 LAB — PROTIME-INR
INR: 0.98 (ref 0.00–1.49)
Prothrombin Time: 13.2 seconds (ref 11.6–15.2)

## 2016-03-10 LAB — APTT: APTT: 27 s (ref 24–37)

## 2016-03-10 NOTE — Pre-Procedure Instructions (Signed)
Kimberly Lowery  03/10/2016     Your procedure is scheduled on Tuesday, March 22, 2016 at 7:30 AM.   Report to [facility] Entrance "A" Admitting Office at 5:30 AM.   Call this number if you have problems the morning of surgery: [phone]   Any questions prior to day of surgery, please call [phone] between 8 & 4 PM.   Remember:  Do not eat food or drink liquids after midnight Monday, 03/21/16.  Take these medicines the morning of surgery with A SIP OF WATER: Fluoxetine (Prozac), Omeprazole (Prilosec)  Stop Aspirin 7 days prior to surgery. Do not use NSAIDS (Ibuprofen, Aleve, etc.) 7 days prior to surgery.   Do not wear jewelry, make-up or nail polish.  Do not wear lotions, powders, or perfumes.  You may wear deodorant.  Do not shave 48 hours prior to surgery.    Do not bring valuables to the hospital.  [facility] is not responsible for any belongings or valuables.  Contacts, dentures or bridgework may not be worn into surgery.  Leave your suitcase in the car.  After surgery it may be brought to your room.  For patients admitted to the hospital, discharge time will be determined by your treatment team.  Special instructions:  [place] - Preparing for Surgery  Before surgery, you can play an important role.  Because skin is not sterile, your skin needs to be as free of germs as possible.  You can reduce the number of germs on you skin by washing with CHG (chlorahexidine gluconate) soap before surgery.  CHG is an antiseptic cleaner which kills germs and bonds with the skin to continue killing germs even after washing.  Please DO NOT use if you have an allergy to CHG or antibacterial soaps.  If your skin becomes reddened/irritated stop using the CHG and inform your nurse when you arrive at Short Stay.  Do not shave (including legs and underarms) for at least 48 hours prior to the first CHG shower.  You may shave your face.  Please follow these instructions  carefully:   1.  Shower with CHG Soap the night before surgery and the                                morning of Surgery.  2.  If you choose to wash your hair, wash your hair first as usual with your       normal shampoo.  3.  After you shampoo, rinse your hair and body thoroughly to remove the                      Shampoo.  4.  Use CHG as you would any other liquid soap.  You can apply chg directly       to the skin and wash gently with scrungie or a clean washcloth.  5.  Apply the CHG Soap to your body ONLY FROM THE NECK DOWN.        Do not use on open wounds or open sores.  Avoid contact with your eyes, ears, mouth and genitals (private parts).  Wash genitals (private parts) with your normal soap.  6.  Wash thoroughly, paying special attention to the area where your surgery        will be performed.  7.  Thoroughly rinse your body with warm water from the neck down.  8.  DO NOT shower/wash with your normal soap after using and rinsing off       the CHG Soap.  9.  Pat yourself dry with a clean towel.            10.  Wear clean pajamas.            11.  Place clean sheets on your bed the night of your first shower and do not        sleep with pets.  Day of Surgery  Do not apply any lotions the morning of surgery.  Please wear clean clothes to the hospital.   Please read over the following fact sheets that you were given. Pain Booklet, Coughing and Deep Breathing, MRSA Information and Surgical Site Infection Prevention

## 2016-03-10 NOTE — Progress Notes (Signed)
Pt denies cardiac history, chest pain or sob. 

## 2016-03-16 NOTE — H&P (Signed)
TOTAL HIP ADMISSION H&P  Patient is admitted for left total hip arthroplasty.  Subjective:  Chief Complaint: left hip pain  HPI: Kimberly Lowery, 70 y.o. female, has a history of pain and functional disability in the left hip(s) due to arthritis and patient has failed non-surgical conservative treatments for greater than 12 weeks to include NSAID's and/or analgesics, flexibility and strengthening excercises, supervised PT with diminished ADL's post treatment, use of assistive devices, weight reduction as appropriate and activity modification.  Onset of symptoms was gradual starting 5 years ago with gradually worsening course since that time.The patient noted no past surgery on the left hip(s).  Patient currently rates pain in the left hip at 10 out of 10 with activity. Patient has night pain, worsening of pain with activity and weight bearing, trendelenberg gait, pain that interfers with activities of daily living and crepitus. Patient has evidence of subchondral cysts, subchondral sclerosis, periarticular osteophytes and joint space narrowing by imaging studies. This condition presents safety issues increasing the risk of falls. There is no current active infection.  Patient Active Problem List   Diagnosis Date Noted  . Degenerative joint disease (DJD) of hip 03/18/2014   Past Medical History  Diagnosis Date  . Asthma   . Depression   . GERD (gastroesophageal reflux disease)   . H/O hiatal hernia   . Anemia   . Blood dyscrasia     grandfather had hx bone marrow Ca  . Family history of anesthesia complication     "my aunt didn't go out during OR"  . High cholesterol   . Arthritis     "right hip" (03/18/2014)  . Chronic lower back pain   . Anxiety   . Obesity   . Fibrocystic breast disease   . Heart murmur     no problem, stated a bruit in right carotid -states she has a ultrasound scan every year  . Sleep apnea     "never RX mask" (03/18/2014)  . IBS (irritable bowel syndrome)   .  PONV (postoperative nausea and vomiting)     bad after last hip replacement    Past Surgical History  Procedure Laterality Date  . Eye surgery Bilateral 1974    "pinguicula"  . Bilateral temporomandibular joint arthroplasty  2002  . Tonsillectomy  1983  . Cholecystectomy  1999  . Abdominal hysterectomy  1996    total abdominal  . Debridement tennis elbow Right 1990  . Finger surgery Right     index cyst removed  . Lumbar laminectomy  1981  . Back surgery  2010  . Lumbar fusion      fusion  . Total hip arthroplasty Right 03/18/2014  . Lumbar disc surgery  01/2009;  . Tubal ligation  1981  . Breast cyst aspiration Left 1980's  . Dilation and curettage of uterus  1980's X 2  . Colonoscopy w/ polypectomy  ~ 2007  . Total hip arthroplasty Right 03/18/2014    Procedure: RIGHT TOTAL HIP ARTHROPLASTY ANTERIOR APPROACH;  Surgeon: Hessie Dibble, MD;  Location: [place];  Service: Orthopedics;  Laterality: Right;    No prescriptions prior to admission   Allergies  Allergen Reactions  . Tape     causes blisters  . Codeine Nausea And Vomiting  . Dilaudid [Hydromorphone Hcl]   . Garlic Other (See Comments)    Stomach Upset  . Morphine Sulfate   . Nucynta [Tapentadol]     hallucinations   . Percocet [Oxycodone-Acetaminophen] Nausea And Vomiting  .  Shrimp [Shellfish Allergy] Nausea And Vomiting    Social History  Substance Use Topics  . Smoking status: Never Smoker   . Smokeless tobacco: Never Used  . Alcohol Use: 6.0 oz/week    5 Glasses of wine, 5 Shots of liquor per week     Comment: 03/18/2014 "1-2 glasses, 5 nights/wk; alcohol, wine"    Family History  Problem Relation Age of Onset  . Heart disease Mother   . Colon cancer Father      Review of Systems  Musculoskeletal: Positive for joint pain.       Left hip  All other systems reviewed and are negative.   Objective:  Physical Exam  Constitutional: She is oriented to person, place, and time. She appears  well-developed and well-nourished.  HENT:  Head: Normocephalic and atraumatic.  Eyes: Pupils are equal, round, and reactive to light.  Neck: Normal range of motion.  Cardiovascular: Normal rate and regular rhythm.   Respiratory: Effort normal.  GI: Soft.  Musculoskeletal:  Her left hip is painful to any significant motion. She has some pain over the greater trochanter. Leg lengths are roughly equal. She is walking with a mildly altered gait. Sensation and motor function are intact in her feet with palpable pulses on both sides.   Neurological: She is alert and oriented to person, place, and time.  Skin: Skin is warm and dry.  Psychiatric: She has a normal mood and affect. Her behavior is normal. Judgment and thought content normal.    Vital signs in last 24 hours:    Labs:   Estimated body mass index is 29.08 kg/(m^2) as calculated from the following:   Height as of 03/07/14: 5\' 7"  (1.702 m).   Weight as of 03/07/14: 84.233 kg (185 lb 11.2 oz).   Imaging Review Plain radiographs demonstrate severe degenerative joint disease of the left hip(s). The bone quality appears to be good for age and reported activity level.  Assessment/Plan:  End stage primary arthritis, left hip(s)  The patient history, physical examination, clinical judgement of the provider and imaging studies are consistent with end stage degenerative joint disease of the left hip(s) and total hip arthroplasty is deemed medically necessary. The treatment options including medical management, injection therapy, arthroscopy and arthroplasty were discussed at length. The risks and benefits of total hip arthroplasty were presented and reviewed. The risks due to aseptic loosening, infection, stiffness, dislocation/subluxation,  thromboembolic complications and other imponderables were discussed.  The patient acknowledged the explanation, agreed to proceed with the plan and consent was signed. Patient is being admitted for  inpatient treatment for surgery, pain control, PT, OT, prophylactic antibiotics, VTE prophylaxis, progressive ambulation and ADL's and discharge planning.The patient is planning to be discharged home with home health services

## 2016-03-21 MED ORDER — CEFAZOLIN SODIUM-DEXTROSE 2-4 GM/100ML-% IV SOLN
2.0000 g | INTRAVENOUS | Status: DC
  Filled 2016-03-21: qty 100

## 2016-03-22 ENCOUNTER — Encounter (HOSPITAL_COMMUNITY)
Admission: RE | Disposition: A | Discharge status: Home Health | Payer: Self-pay | Source: Ambulatory Visit | Attending: Orthopaedic Surgery

## 2016-03-22 ENCOUNTER — Encounter (HOSPITAL_COMMUNITY): Payer: Self-pay | Admitting: Surgery

## 2016-03-22 ENCOUNTER — Inpatient Hospital Stay (HOSPITAL_COMMUNITY)
Admission: RE | Admit: 2016-03-22 | Discharge: 2016-03-24 | DRG: 470 | Disposition: A | Discharge status: Home Health | Payer: PPO | Source: Ambulatory Visit | Attending: Orthopaedic Surgery | Admitting: Orthopaedic Surgery

## 2016-03-22 ENCOUNTER — Inpatient Hospital Stay (HOSPITAL_COMMUNITY): Payer: PPO

## 2016-03-22 ENCOUNTER — Inpatient Hospital Stay (HOSPITAL_COMMUNITY): Payer: PPO | Admitting: Anesthesiology

## 2016-03-22 DIAGNOSIS — Z90710 Acquired absence of both cervix and uterus: Secondary | ICD-10-CM

## 2016-03-22 DIAGNOSIS — M1612 Unilateral primary osteoarthritis, left hip: Secondary | ICD-10-CM | POA: Diagnosis not present

## 2016-03-22 DIAGNOSIS — Z91048 Other nonmedicinal substance allergy status: Secondary | ICD-10-CM | POA: Diagnosis not present

## 2016-03-22 DIAGNOSIS — E7800 Pure hypercholesterolemia, unspecified: Secondary | ICD-10-CM | POA: Diagnosis present

## 2016-03-22 DIAGNOSIS — Z96641 Presence of right artificial hip joint: Secondary | ICD-10-CM | POA: Diagnosis present

## 2016-03-22 DIAGNOSIS — Z91013 Allergy to seafood: Secondary | ICD-10-CM

## 2016-03-22 DIAGNOSIS — Z981 Arthrodesis status: Secondary | ICD-10-CM | POA: Diagnosis not present

## 2016-03-22 DIAGNOSIS — K219 Gastro-esophageal reflux disease without esophagitis: Secondary | ICD-10-CM | POA: Diagnosis present

## 2016-03-22 DIAGNOSIS — Z96642 Presence of left artificial hip joint: Secondary | ICD-10-CM | POA: Diagnosis not present

## 2016-03-22 DIAGNOSIS — K589 Irritable bowel syndrome without diarrhea: Secondary | ICD-10-CM | POA: Diagnosis not present

## 2016-03-22 DIAGNOSIS — M169 Osteoarthritis of hip, unspecified: Secondary | ICD-10-CM | POA: Diagnosis not present

## 2016-03-22 DIAGNOSIS — D649 Anemia, unspecified: Secondary | ICD-10-CM | POA: Diagnosis not present

## 2016-03-22 DIAGNOSIS — Z419 Encounter for procedure for purposes other than remedying health state, unspecified: Secondary | ICD-10-CM

## 2016-03-22 DIAGNOSIS — Z471 Aftercare following joint replacement surgery: Secondary | ICD-10-CM | POA: Diagnosis not present

## 2016-03-22 DIAGNOSIS — Z885 Allergy status to narcotic agent status: Secondary | ICD-10-CM | POA: Diagnosis not present

## 2016-03-22 HISTORY — PX: TOTAL HIP ARTHROPLASTY: SHX124

## 2016-03-22 SURGERY — ARTHROPLASTY, HIP, TOTAL, ANTERIOR APPROACH
Anesthesia: Spinal | Site: Hip | Laterality: Left

## 2016-03-22 MED ORDER — HYDROCORTISONE 1 % EX CREA: TOPICAL_CREAM | Freq: Every day | CUTANEOUS | Status: DC | PRN

## 2016-03-22 MED ORDER — PHENYLEPHRINE 40 MCG/ML (10ML) SYRINGE FOR IV PUSH (FOR BLOOD PRESSURE SUPPORT)
PREFILLED_SYRINGE | INTRAVENOUS | Status: DC | PRN
  Administered 2016-03-22: 80 ug via INTRAVENOUS

## 2016-03-22 MED ORDER — 0.9 % SODIUM CHLORIDE (POUR BTL) OPTIME
TOPICAL | Status: DC | PRN
  Administered 2016-03-22: 1000 mL

## 2016-03-22 MED ORDER — METOCLOPRAMIDE HCL 5 MG/ML IJ SOLN
5.0000 mg | Freq: Three times a day (TID) | INTRAMUSCULAR | Status: DC | PRN
  Administered 2016-03-22: 10 mg via INTRAVENOUS
  Filled 2016-03-22: qty 2

## 2016-03-22 MED ORDER — PANTOPRAZOLE SODIUM 40 MG PO TBEC
40.0000 mg | DELAYED_RELEASE_TABLET | Freq: Every day | ORAL | Status: DC
  Administered 2016-03-23 – 2016-03-24 (×2): 40 mg via ORAL
  Filled 2016-03-22 (×2): qty 1

## 2016-03-22 MED ORDER — BISACODYL 5 MG PO TBEC: 5.0000 mg | DELAYED_RELEASE_TABLET | Freq: Every day | ORAL | Status: DC | PRN

## 2016-03-22 MED ORDER — PROMETHAZINE HCL 25 MG/ML IJ SOLN
12.5000 mg | Freq: Four times a day (QID) | INTRAMUSCULAR | Status: DC | PRN
Start: 2016-03-22 — End: 2016-03-24
  Administered 2016-03-22 – 2016-03-24 (×2): 12.5 mg via INTRAVENOUS
  Filled 2016-03-22 (×2): qty 1

## 2016-03-22 MED ORDER — DIPHENHYDRAMINE HCL 12.5 MG/5ML PO ELIX: 12.5000 mg | ORAL_SOLUTION | ORAL | Status: DC | PRN

## 2016-03-22 MED ORDER — ONDANSETRON HCL 4 MG/2ML IJ SOLN
4.0000 mg | Freq: Four times a day (QID) | INTRAMUSCULAR | Status: DC | PRN
  Administered 2016-03-22 – 2016-03-23 (×2): 4 mg via INTRAVENOUS
  Filled 2016-03-22 (×2): qty 2

## 2016-03-22 MED ORDER — BUPIVACAINE-EPINEPHRINE (PF) 0.25% -1:200000 IJ SOLN
INTRAMUSCULAR | Status: AC
  Filled 2016-03-22: qty 30

## 2016-03-22 MED ORDER — TRANEXAMIC ACID 1000 MG/10ML IV SOLN
1000.0000 mg | INTRAVENOUS | Status: AC
  Administered 2016-03-22 (×2): 1000 mg via INTRAVENOUS
  Filled 2016-03-22: qty 10

## 2016-03-22 MED ORDER — FENTANYL CITRATE (PF) 250 MCG/5ML IJ SOLN
INTRAMUSCULAR | Status: AC
  Filled 2016-03-22: qty 5

## 2016-03-22 MED ORDER — PROPOFOL 10 MG/ML IV BOLUS
INTRAVENOUS | Status: DC | PRN
  Administered 2016-03-22: 10 mg via INTRAVENOUS

## 2016-03-22 MED ORDER — ONDANSETRON HCL 4 MG/2ML IJ SOLN
INTRAMUSCULAR | Status: DC | PRN
  Administered 2016-03-22: 4 mg via INTRAVENOUS

## 2016-03-22 MED ORDER — CHLORHEXIDINE GLUCONATE 4 % EX LIQD: 60.0000 mL | Freq: Once | CUTANEOUS | Status: DC

## 2016-03-22 MED ORDER — METHOCARBAMOL 500 MG PO TABS
500.0000 mg | ORAL_TABLET | Freq: Four times a day (QID) | ORAL | Status: DC | PRN
  Administered 2016-03-22 – 2016-03-23 (×4): 500 mg via ORAL
  Filled 2016-03-22 (×6): qty 1

## 2016-03-22 MED ORDER — METOCLOPRAMIDE HCL 5 MG PO TABS: 5.0000 mg | ORAL_TABLET | Freq: Three times a day (TID) | ORAL | Status: DC | PRN

## 2016-03-22 MED ORDER — PROPOFOL 10 MG/ML IV BOLUS
INTRAVENOUS | Status: AC
  Filled 2016-03-22: qty 40

## 2016-03-22 MED ORDER — MEPERIDINE HCL 25 MG/ML IJ SOLN: 6.2500 mg | INTRAMUSCULAR | Status: DC | PRN

## 2016-03-22 MED ORDER — PROPOFOL 500 MG/50ML IV EMUL
INTRAVENOUS | Status: DC | PRN
  Administered 2016-03-22: 50 ug/kg/min via INTRAVENOUS

## 2016-03-22 MED ORDER — LACTATED RINGERS IV SOLN: INTRAVENOUS | Status: DC

## 2016-03-22 MED ORDER — DOCUSATE SODIUM 100 MG PO CAPS
100.0000 mg | ORAL_CAPSULE | Freq: Two times a day (BID) | ORAL | Status: DC
  Administered 2016-03-22 – 2016-03-24 (×4): 100 mg via ORAL
  Filled 2016-03-22 (×4): qty 1

## 2016-03-22 MED ORDER — DOXYCYCLINE CALCIUM 50 MG/5ML PO SYRP
20.0000 mg | ORAL_SOLUTION | Freq: Two times a day (BID) | ORAL | Status: DC
  Administered 2016-03-22 – 2016-03-24 (×2): 20 mg via ORAL
  Filled 2016-03-22 (×5): qty 2

## 2016-03-22 MED ORDER — HYDROCODONE-ACETAMINOPHEN 5-325 MG PO TABS
1.0000 | ORAL_TABLET | ORAL | Status: DC | PRN
  Administered 2016-03-22 – 2016-03-24 (×9): 2 via ORAL
  Filled 2016-03-22 (×10): qty 2

## 2016-03-22 MED ORDER — PHENOL 1.4 % MT LIQD: 1.0000 | OROMUCOSAL | Status: DC | PRN

## 2016-03-22 MED ORDER — MIDAZOLAM HCL 2 MG/2ML IJ SOLN
INTRAMUSCULAR | Status: DC | PRN
  Administered 2016-03-22 (×2): 1 mg via INTRAVENOUS

## 2016-03-22 MED ORDER — CEFAZOLIN SODIUM-DEXTROSE 2-4 GM/100ML-% IV SOLN
2.0000 g | Freq: Four times a day (QID) | INTRAVENOUS | Status: AC
  Administered 2016-03-22 (×2): 2 g via INTRAVENOUS
  Filled 2016-03-22 (×2): qty 100

## 2016-03-22 MED ORDER — UREA 40 % EX CREA: 1.0000 "application " | TOPICAL_CREAM | CUTANEOUS | Status: DC | PRN

## 2016-03-22 MED ORDER — METHOCARBAMOL 1000 MG/10ML IJ SOLN: 500.0000 mg | Freq: Four times a day (QID) | INTRAVENOUS | Status: DC | PRN

## 2016-03-22 MED ORDER — ACETAMINOPHEN 650 MG RE SUPP: 650.0000 mg | Freq: Four times a day (QID) | RECTAL | Status: DC | PRN

## 2016-03-22 MED ORDER — MENTHOL 3 MG MT LOZG: 1.0000 | LOZENGE | OROMUCOSAL | Status: DC | PRN

## 2016-03-22 MED ORDER — ALUM & MAG HYDROXIDE-SIMETH 200-200-20 MG/5ML PO SUSP: 30.0000 mL | ORAL | Status: DC | PRN

## 2016-03-22 MED ORDER — CEFAZOLIN SODIUM 1 G IJ SOLR
INTRAMUSCULAR | Status: DC | PRN
  Administered 2016-03-22: 2 g via INTRAMUSCULAR

## 2016-03-22 MED ORDER — FLUOXETINE HCL 20 MG PO CAPS
20.0000 mg | ORAL_CAPSULE | Freq: Every day | ORAL | Status: DC
  Administered 2016-03-23 – 2016-03-24 (×2): 20 mg via ORAL
  Filled 2016-03-22 (×2): qty 1

## 2016-03-22 MED ORDER — ACETAMINOPHEN 325 MG PO TABS: 650.0000 mg | ORAL_TABLET | Freq: Four times a day (QID) | ORAL | Status: DC | PRN

## 2016-03-22 MED ORDER — ASPIRIN EC 325 MG PO TBEC
325.0000 mg | DELAYED_RELEASE_TABLET | Freq: Two times a day (BID) | ORAL | Status: DC
  Administered 2016-03-23 – 2016-03-24 (×3): 325 mg via ORAL
  Filled 2016-03-22 (×4): qty 1

## 2016-03-22 MED ORDER — SODIUM CHLORIDE 0.9 % IV SOLN
10000.0000 ug | INTRAVENOUS | Status: DC | PRN
  Administered 2016-03-22: 20 ug/min via INTRAVENOUS

## 2016-03-22 MED ORDER — ROSUVASTATIN CALCIUM 10 MG PO TABS
10.0000 mg | ORAL_TABLET | Freq: Every day | ORAL | Status: DC
  Administered 2016-03-23 – 2016-03-24 (×2): 10 mg via ORAL
  Filled 2016-03-22 (×2): qty 1

## 2016-03-22 MED ORDER — BUPIVACAINE HCL (PF) 0.5 % IJ SOLN
INTRAMUSCULAR | Status: DC | PRN
  Administered 2016-03-22: 3 mL

## 2016-03-22 MED ORDER — LACTATED RINGERS IV SOLN
INTRAVENOUS | Status: DC
  Administered 2016-03-22: 16:00:00 via INTRAVENOUS

## 2016-03-22 MED ORDER — BUPIVACAINE-EPINEPHRINE 0.25% -1:200000 IJ SOLN
INTRAMUSCULAR | Status: DC | PRN
  Administered 2016-03-22: 20 mL

## 2016-03-22 MED ORDER — TRANEXAMIC ACID 1000 MG/10ML IV SOLN
1000.0000 mg | INTRAVENOUS | Status: DC
  Filled 2016-03-22: qty 10

## 2016-03-22 MED ORDER — MIDAZOLAM HCL 2 MG/2ML IJ SOLN
INTRAMUSCULAR | Status: AC
  Filled 2016-03-22: qty 2

## 2016-03-22 MED ORDER — HYDROMORPHONE HCL 1 MG/ML IJ SOLN: 0.5000 mg | INTRAMUSCULAR | Status: DC | PRN

## 2016-03-22 MED ORDER — BUPIVACAINE LIPOSOME 1.3 % IJ SUSP
20.0000 mL | INTRAMUSCULAR | Status: AC
  Administered 2016-03-22: 20 mL
  Filled 2016-03-22: qty 20

## 2016-03-22 MED ORDER — ONDANSETRON HCL 4 MG PO TABS: 4.0000 mg | ORAL_TABLET | Freq: Four times a day (QID) | ORAL | Status: DC | PRN

## 2016-03-22 MED ORDER — CICLOPIROX 8 % EX SOLN: 1.0000 "application " | Freq: Every day | CUTANEOUS | Status: DC

## 2016-03-22 MED ORDER — CLINDAMYCIN PHOSPHATE 1 % EX LOTN: 1.0000 "application " | TOPICAL_LOTION | Freq: Every day | CUTANEOUS | Status: DC

## 2016-03-22 MED ORDER — LACTATED RINGERS IV SOLN
INTRAVENOUS | Status: DC | PRN
  Administered 2016-03-22 (×2): via INTRAVENOUS

## 2016-03-22 MED ORDER — METOCLOPRAMIDE HCL 5 MG/ML IJ SOLN: 10.0000 mg | Freq: Once | INTRAMUSCULAR | Status: DC | PRN

## 2016-03-22 MED ORDER — FENTANYL CITRATE (PF) 100 MCG/2ML IJ SOLN: 25.0000 ug | INTRAMUSCULAR | Status: DC | PRN

## 2016-03-22 SURGICAL SUPPLY — 47 items
BLADE SAW SGTL 18X1.27X75 (BLADE) ×2 IMPLANT
BLADE SURG ROTATE 9660 (MISCELLANEOUS) IMPLANT
CAPT HIP TOTAL 2 ×2 IMPLANT
CELLS DAT CNTRL 66122 CELL SVR (MISCELLANEOUS) ×1 IMPLANT
COVER PERINEAL POST (MISCELLANEOUS) ×2 IMPLANT
COVER SURGICAL LIGHT HANDLE (MISCELLANEOUS) ×2 IMPLANT
DRAPE C-ARM 42X72 X-RAY (DRAPES) ×2 IMPLANT
DRAPE IMP U-DRAPE 54X76 (DRAPES) ×2 IMPLANT
DRAPE STERI IOBAN 125X83 (DRAPES) ×2 IMPLANT
DRAPE U-SHAPE 47X51 STRL (DRAPES) ×6 IMPLANT
DRSG AQUACEL AG ADV 3.5X10 (GAUZE/BANDAGES/DRESSINGS) ×2 IMPLANT
DURAPREP 26ML APPLICATOR (WOUND CARE) ×2 IMPLANT
ELECT BLADE 4.0 EZ CLEAN MEGAD (MISCELLANEOUS) ×2
ELECT CAUTERY BLADE 6.4 (BLADE) ×2 IMPLANT
ELECT REM PT RETURN 9FT ADLT (ELECTROSURGICAL) ×2
ELECTRODE BLDE 4.0 EZ CLN MEGD (MISCELLANEOUS) ×1 IMPLANT
ELECTRODE REM PT RTRN 9FT ADLT (ELECTROSURGICAL) ×1 IMPLANT
FACESHIELD WRAPAROUND (MASK) ×6 IMPLANT
GLOVE BIO SURGEON STRL SZ8 (GLOVE) ×6 IMPLANT
GLOVE BIOGEL PI IND STRL 8 (GLOVE) ×3 IMPLANT
GLOVE BIOGEL PI INDICATOR 8 (GLOVE) ×3
GOWN STRL REUS W/ TWL LRG LVL3 (GOWN DISPOSABLE) ×1 IMPLANT
GOWN STRL REUS W/ TWL XL LVL3 (GOWN DISPOSABLE) ×3 IMPLANT
GOWN STRL REUS W/TWL LRG LVL3 (GOWN DISPOSABLE) ×1
GOWN STRL REUS W/TWL XL LVL3 (GOWN DISPOSABLE) ×3
KIT BASIN OR (CUSTOM PROCEDURE TRAY) ×2 IMPLANT
KIT ROOM TURNOVER OR (KITS) ×2 IMPLANT
MANIFOLD NEPTUNE II (INSTRUMENTS) ×2 IMPLANT
NEEDLE HYPO 22GX1.5 SAFETY (NEEDLE) ×2 IMPLANT
NS IRRIG 1000ML POUR BTL (IV SOLUTION) ×2 IMPLANT
PACK TOTAL JOINT (CUSTOM PROCEDURE TRAY) ×2 IMPLANT
PAD ARMBOARD 7.5X6 YLW CONV (MISCELLANEOUS) ×4 IMPLANT
RTRCTR WOUND ALEXIS 18CM MED (MISCELLANEOUS) ×2
STAPLER VISISTAT 35W (STAPLE) ×2 IMPLANT
SUT ETHIBOND NAB CT1 #1 30IN (SUTURE) ×6 IMPLANT
SUT VIC AB 0 CT1 27 (SUTURE)
SUT VIC AB 0 CT1 27XBRD ANBCTR (SUTURE) IMPLANT
SUT VIC AB 1 CT1 27 (SUTURE) ×1
SUT VIC AB 1 CT1 27XBRD ANBCTR (SUTURE) ×1 IMPLANT
SUT VIC AB 2-0 CT1 27 (SUTURE) ×1
SUT VIC AB 2-0 CT1 TAPERPNT 27 (SUTURE) ×1 IMPLANT
SUT VLOC 180 0 24IN GS25 (SUTURE) ×2 IMPLANT
SYR 50ML LL SCALE MARK (SYRINGE) ×2 IMPLANT
TOWEL OR 17X24 6PK STRL BLUE (TOWEL DISPOSABLE) ×2 IMPLANT
TOWEL OR 17X26 10 PK STRL BLUE (TOWEL DISPOSABLE) ×4 IMPLANT
TRAY FOLEY CATH 14FR (SET/KITS/TRAYS/PACK) IMPLANT
WATER STERILE IRR 1000ML POUR (IV SOLUTION) ×4 IMPLANT

## 2016-03-22 NOTE — Anesthesia Preprocedure Evaluation (Signed)
Anesthesia Evaluation  Patient identified by MRN, date of birth, ID band Patient awake    Reviewed: Allergy & Precautions, H&P , NPO status , Patient's Chart, lab work & pertinent test results  History of Anesthesia Complications (+) PONV  Airway Mallampati: II  TM Distance: <3 FB Neck ROM: Full    Dental no notable dental hx. (+) Teeth Intact, Dental Advisory Given   Pulmonary asthma , neg sleep apnea, neg COPD,    Pulmonary exam normal breath sounds clear to auscultation       Cardiovascular negative cardio ROS Normal cardiovascular exam Rhythm:Regular Rate:Normal     Neuro/Psych PSYCHIATRIC DISORDERS Depression negative neurological ROS     GI/Hepatic Neg liver ROS, hiatal hernia, GERD  Medicated and Controlled,  Endo/Other  negative endocrine ROS  Renal/GU negative Renal ROS     Musculoskeletal   Abdominal   Peds  Hematology negative hematology ROS (+)   Anesthesia Other Findings   Reproductive/Obstetrics                             Anesthesia Physical  Anesthesia Plan  ASA: II  Anesthesia Plan: Spinal   Post-op Pain Management:    Induction:   Airway Management Planned: Simple Face Mask  Additional Equipment: None  Intra-op Plan:   Post-operative Plan:   Informed Consent: I have reviewed the patients History and Physical, chart, labs and discussed the procedure including the risks, benefits and alternatives for the proposed anesthesia with the patient or authorized representative who has indicated his/her understanding and acceptance.   Dental advisory given  Plan Discussed with: CRNA, Anesthesiologist and Surgeon  Anesthesia Plan Comments: (Previous difficult intubation. SAB today)        Anesthesia Quick Evaluation

## 2016-03-22 NOTE — Transfer of Care (Signed)
Immediate Anesthesia Transfer of Care Note  Patient: Kimberly Lowery  Procedure(s) Performed: Procedure(s): TOTAL HIP ARTHROPLASTY ANTERIOR APPROACH (Left)  Patient Location: PACU  Anesthesia Type:Spinal  Level of Consciousness: awake, alert  and oriented  Airway & Oxygen Therapy: Patient Spontanous Breathing and Patient connected to face mask oxygen  Post-op Assessment: Report given to RN  Post vital signs: Reviewed and stable  Last Vitals:  Filed Vitals:   03/22/16 0600  BP: 142/67  Pulse: 70  Temp: 36.8 C  Resp: 20    Last Pain: There were no vitals filed for this visit.    Patients Stated Pain Goal: 6 ([id number])  Complications: No apparent anesthesia complications

## 2016-03-22 NOTE — Op Note (Signed)

## 2016-03-22 NOTE — Progress Notes (Signed)
Call to OR, Loni Dolly, PA-C to inform that patient vomited pain medication, Pt stated " large white thing in vomitus" that we thing was pain medication.  Order to repeat doses of pain medication given at 1230.  Patient stated that she felt better after giving IV reglan.  Order also obtained for phenergan per patient request. Stated this happened 2 years ago and what was given then worked.  Note in 2015 stated phenergan was ordered after persistent N/V.

## 2016-03-22 NOTE — Interval H&P Note (Signed)
History and Physical Interval Note:  03/22/2016 7:20 AM  Kimberly Lowery  has presented today for surgery, with the diagnosis of LEFT HIP DEGENERATIVE JOINT DISEASE  The various methods of treatment have been discussed with the patient and family. After consideration of risks, benefits and other options for treatment, the patient has consented to  Procedure(s): TOTAL HIP ARTHROPLASTY ANTERIOR APPROACH (Left) as a surgical intervention .  The patient's history has been reviewed, patient examined, no change in status, stable for surgery.  I have reviewed the patient's chart and labs.  Questions were answered to the patient's satisfaction.     Sheryle Vice G

## 2016-03-22 NOTE — Anesthesia Procedure Notes (Signed)
Spinal  Patient location during procedure: OR Staffing Anesthesiologist: Indie Nickerson Performed by: anesthesiologist  Preanesthetic Checklist Completed: patient identified, site marked, surgical consent, pre-op evaluation, timeout performed, IV checked, risks and benefits discussed and monitors and equipment checked Spinal Block Patient position: sitting Prep: Betadine Patient monitoring: heart rate, continuous pulse ox and blood pressure Approach: right paramedian Location: L2-3 Injection technique: single-shot Needle Needle type: Spinocan  Needle gauge: 22 G Needle length: 9 cm Additional Notes Expiration date of kit checked and confirmed. Patient tolerated procedure well, without complications.     

## 2016-03-22 NOTE — Anesthesia Postprocedure Evaluation (Signed)
Anesthesia Post Note  Patient: Kimberly Lowery  Procedure(s) Performed: Procedure(s) (LRB): TOTAL HIP ARTHROPLASTY ANTERIOR APPROACH (Left)  Patient location during evaluation: PACU Anesthesia Type: Spinal Level of consciousness: oriented and awake and alert Pain management: pain level controlled Vital Signs Assessment: post-procedure vital signs reviewed and stable Respiratory status: spontaneous breathing, respiratory function stable and patient connected to nasal cannula oxygen Cardiovascular status: blood pressure returned to baseline and stable Postop Assessment: no headache and no backache Anesthetic complications: no    Last Vitals:  Filed Vitals:   03/22/16 1106 03/22/16 1131  BP: 124/46 121/75  Pulse: 64 65  Temp: 36.6 C 36.5 C  Resp: 21 16    Last Pain:  Filed Vitals:   03/22/16 1232  PainSc: 10-Worst pain ever                 Montez Hageman

## 2016-03-23 ENCOUNTER — Encounter (HOSPITAL_COMMUNITY): Payer: Self-pay | Admitting: Orthopaedic Surgery

## 2016-03-23 NOTE — Progress Notes (Signed)
Subjective: 1 Day Post-Op Procedure(s) (LRB): TOTAL HIP ARTHROPLASTY ANTERIOR APPROACH (Left)  Activity level:  wbat Diet tolerance:  ok Voiding:  ok Patient reports pain as mild.    Objective: Vital signs in last 24 hours: Temp:  [97.4 F (36.3 C)-98.8 F (37.1 C)] 97.5 F (36.4 C) (06/21 0515) Pulse Rate:  [56-74] 68 (06/21 0515) Resp:  [11-21] 16 (06/21 0515) BP: (97-133)/(46-75) 132/68 mmHg (06/21 0515) SpO2:  [95 %-100 %] 99 % (06/21 0515)  Labs: No results for input(s): HGB in the last 72 hours. No results for input(s): WBC, RBC, HCT, PLT in the last 72 hours. No results for input(s): NA, K, CL, CO2, BUN, CREATININE, GLUCOSE, CALCIUM in the last 72 hours. No results for input(s): LABPT, INR in the last 72 hours.  Physical Exam:  Neurologically intact ABD soft Neurovascular intact Sensation intact distally Intact pulses distally Dorsiflexion/Plantar flexion intact Incision: dressing C/D/I and no drainage No cellulitis present Compartment soft  Assessment/Plan:  1 Day Post-Op Procedure(s) (LRB): TOTAL HIP ARTHROPLASTY ANTERIOR APPROACH (Left) Advance diet Up with therapy Plan for discharge tomorrow Discharge home with home health if cleared by PT and still doing well.  Continue on ASA 325mg  BID x 4 weeks post op. Follow up in office 2 weeks post op.  Morio Widen, [name] 03/23/2016, 7:35 AM

## 2016-03-23 NOTE — Progress Notes (Signed)
Physical Therapy Treatment Patient Details Name: Kimberly Lowery MRN: [id number] DOB: May 11, 1946 Today's Date: 03/23/2016    History of Present Illness 70 y.o. female s/p Lt anterior THA. PMH: Rt THA, asthma, depression, chronic low back pain, anxiety.     PT Comments    Patient is progressing toward mobility goals. Patient needs to practice stairs next session.   Continue to progress as tolerated with anticipated d/c home with HHPT.   Follow Up Recommendations  Home health PT;Supervision for mobility/OOB     Equipment Recommendations  None recommended by PT (has needed equipment at home)    Recommendations for Other Services       Precautions / Restrictions Precautions Precautions: Fall Precaution Comments: no hip precautions Restrictions Weight Bearing Restrictions: Yes LLE Weight Bearing: Weight bearing as tolerated    Mobility  Bed Mobility Overal bed mobility: Needs Assistance Bed Mobility: Supine to Sit     Supine to sit: Supervision;HOB elevated     General bed mobility comments: supervision for safety; increased time and use of bed rail  Transfers Overall transfer level: Needs assistance Equipment used: Rolling walker (2 wheeled) Transfers: Sit to/from Stand Sit to Stand: Min guard         General transfer comment: cues for hand placement  Ambulation/Gait Ambulation/Gait assistance: Supervision Ambulation Distance (Feet): 150 Feet Assistive device: Rolling walker (2 wheeled) Gait Pattern/deviations: Step-through pattern;Decreased stance time - left;Decreased weight shift to left;Antalgic;Trunk flexed Gait velocity: slow pattern   General Gait Details: cues for posture, L heel strike, and increased WB on L LE; pt with difficulty advancing L LE forward initially and with increased distance was able to improve   Stairs            Wheelchair Mobility    Modified Rankin (Stroke Patients Only)       Balance   Sitting-balance support: No  upper extremity supported;Feet supported Sitting balance-Leahy Scale: Good     Standing balance support: Bilateral upper extremity supported Standing balance-Leahy Scale: Fair                      Cognition Arousal/Alertness: Awake/alert Behavior During Therapy: WFL for tasks assessed/performed Overall Cognitive Status: Within Functional Limits for tasks assessed                      Exercises Total Joint Exercises Quad Sets: Strengthening;Both;10 reps;Supine Gluteal Sets: Strengthening;Both;10 reps;Supine Heel Slides: AROM;Left;10 reps;Supine Hip ABduction/ADduction: AAROM;Left;10 reps;Supine    General Comments        Pertinent Vitals/Pain Pain Assessment: 0-10 Pain Score: 8  Pain Location: L hip/thigh with mobility Pain Descriptors / Indicators: Guarding;Sore Pain Intervention(s): Limited activity within patient's tolerance;Monitored during session;Premedicated before session;Repositioned;Ice applied    Home Living                      Prior Function            PT Goals (current goals can now be found in the care plan section) Acute Rehab PT Goals Patient Stated Goal: get home PT Goal Formulation: With patient Time For Goal Achievement: 04/05/16 Potential to Achieve Goals: Good Progress towards PT goals: Progressing toward goals    Frequency  7X/week    PT Plan Current plan remains appropriate    Co-evaluation             End of Session Equipment Utilized During Treatment: Gait belt Activity Tolerance: Patient tolerated treatment well Patient  left: in chair;with call bell/phone within reach;with nursing/sitter in room     Time: [phone] PT Time Calculation (min) (ACUTE ONLY): 38 min  Charges:  $Gait Training: 8-22 mins $Therapeutic Exercise: 8-22 mins $Therapeutic Activity: 8-22 mins                    G Codes:      Salina April, PTA Pager: [phone]   03/23/2016, 11:12 AM

## 2016-03-23 NOTE — Care Management Note (Signed)
Case Management Note  Patient Details  Name: CHERRITA TRAUGHBER MRN: [id number] Date of Birth: [birth year]  Subjective/Objective:  70 yr old female s/p left total hip, posterior approach.                  Action/Plan:Case manager spoke with patient at the bedside concerning home health and DME needs. Choice was offered for Home Health, patient states she has used [facility] in the past and wants to do now. Referral was called to Lelan Pons, [facility] Liaison. Patient has rolling walker and 3in1 from previous surgery, has family support at discharge.  Expected Discharge Date:   03/24/16               Expected Discharge Plan:  [facility]  In-House Referral:     Discharge planning Services  CM Consult  Post Acute Care Choice:  Home Health Choice offered to:  Patient  DME Arranged:  N/A DME Agency:  NA  HH Arranged:  PT [place] Agency:  [facility]  Status of Service:  Completed, signed off  If discussed at [place] of Stay Meetings, dates discussed:    Additional Comments:  Ninfa Meeker, RN 03/23/2016, 4:13 PM

## 2016-03-23 NOTE — Evaluation (Signed)
Occupational Therapy Evaluation Patient Details Name: Kimberly Lowery MRN: [id number] DOB: 05-Feb-1946 Today's Date: 03/23/2016    History of Present Illness 70 y.o. female s/p Lt direct anterior THA. PMH: Rt THA, asthma, depression, chronic low back pain, anxiety, arthritis, GERD, back surgeries.   Clinical Impression   Pt s/p above. Education provided in session and OT signing off.     Follow Up Recommendations  No OT follow up;Supervision - Intermittent    Equipment Recommendations  None recommended by OT    Recommendations for Other Services       Precautions / Restrictions Precautions Precautions: Fall Precaution Comments: no hip precautions Restrictions Weight Bearing Restrictions: Yes LLE Weight Bearing: Weight bearing as tolerated      Mobility Bed Mobility  General bed mobility comments: did not perform  Transfers Overall transfer level: Needs assistance Transfers: Sit to/from Stand Sit to Stand: Supervision         General transfer comment: also set up for RW    Balance   Min assist for ambulation without RW; Min assist for simulated shower transfer.                           ADL Overall ADL's : Needs assistance/impaired                     Lower Body Dressing: Sit to/from stand;Supervision/safety;Set up   Toilet Transfer: Ambulation;Supervision/safety;RW (also set up for RW; Min A walking without RW)       Tub/ Shower Transfer: Minimal assistance;Walk-in shower;Ambulation (tried stepping over simulated threshold without RW, but pt reached for RW when stepping out of simulated shower)   Functional mobility during ADLs: Rolling walker (Min A walking without RW; Supervision walking with RW and set up for RW; Min Assist for simulated shower transfer)  General ADL Comments: Educated on LB dressing technique. Educated on shower transfer technique. Educated on safety such as using bag on walker, safe footwear, sitting for LB ADLs, and  rugs/items on floor. Recommended someone be with her for shower transfer.      Vision     Perception     Praxis      Pertinent Vitals/Pain Pain Assessment: 0-10 Pain Score: 3  Pain Location: LLE Pain Descriptors / Indicators: Burning Pain Intervention(s): Monitored during session     Hand Dominance     Extremity/Trunk Assessment Upper Extremity Assessment Upper Extremity Assessment: Overall WFL for tasks assessed   Lower Extremity Assessment Lower Extremity Assessment: Defer to PT evaluation       Communication Communication Communication: No difficulties   Cognition Arousal/Alertness: Awake/alert Behavior During Therapy: WFL for tasks assessed/performed Overall Cognitive Status: Within Functional Limits for tasks assessed                     General Comments          Shoulder Instructions      Home Living Family/patient expects to be discharged to:: Private residence Living Arrangements: Spouse/significant other Available Help at Discharge: Family;Available 24 hours/day Type of Home: House Home Access: Stairs to enter [employer] of Steps: 2 Entrance Stairs-Rails: None Home Layout: Able to live on main level with bedroom/bathroom;Two level Alternate Level Stairs-Number of Steps: flight   Bathroom Shower/Tub: Walk-in shower         Home Equipment: Environmental consultant - 2 wheels;Bedside commode;Cane - single point;Shower seat - built Education officer, museum  Prior Functioning/Environment Level of Independence: Needs assistance    ADL's / Homemaking Assistance Needed: assist with cleaning        OT Diagnosis: Acute pain   OT Problem List: Decreased range of motion;Decreased strength;Pain;Impaired balance (sitting and/or standing)   OT Treatment/Interventions:      OT Goals(Current goals can be found in the care plan section)   OT Frequency:     Barriers to D/C:            Co-evaluation               End of Session Equipment Utilized During Treatment: Gait belt;Rolling walker  Activity Tolerance: Patient tolerated treatment well Patient left: in chair;with call bell/phone within reach;with family/visitor present   Time: 1212-1228 OT Time Calculation (min): 16 min Charges:  OT General Charges $OT Visit: 1 Procedure OT Evaluation $OT Eval Moderate Complexity: 1 Procedure G-CodesBenito Mccreedy OTR/L [id number] 03/23/2016, 1:04 PM

## 2016-03-23 NOTE — Care Management Important Message (Signed)
Important Message  Patient Details  Name: BLAKELI SAMPLEY MRN: [id number] Date of Birth: [birth year]   Medicare Important Message Given:  Yes    Loann Quill 03/23/2016, 1:52 PM

## 2016-03-23 NOTE — Progress Notes (Signed)
Physical Therapy Treatment Patient Details Name: Kimberly Lowery MRN: [id number] DOB: 1946-07-10 Today's Date: 03/23/2016    History of Present Illness 70 y.o. female s/p Lt anterior THA. PMH: Rt THA, asthma, depression, chronic low back pain, anxiety.     PT Comments    Patient continues to progress toward PT goals. Improved gait mechanics this session. Tolerated stair training. Current plan remains appropriate.   Follow Up Recommendations  Home health PT;Supervision for mobility/OOB     Equipment Recommendations  None recommended by PT (has needed equipment at home)    Recommendations for Other Services       Precautions / Restrictions Precautions Precautions: Fall Precaution Comments: no hip precautions Restrictions Weight Bearing Restrictions: Yes LLE Weight Bearing: Weight bearing as tolerated    Mobility  Bed Mobility Overal bed mobility: Needs Assistance Bed Mobility: Sit to Supine       Sit to supine: Min assist   General bed mobility comments: assist to elevate L LE into bed with bed elevated to simulate home; no use of rails and cues for technique using step   Transfers Overall transfer level: Needs assistance Equipment used: Rolling walker (2 wheeled) Transfers: Sit to/from Stand Sit to Stand: Min guard         General transfer comment: cues for hand placement  Ambulation/Gait Ambulation/Gait assistance: Supervision Ambulation Distance (Feet): 200 Feet Assistive device: Rolling walker (2 wheeled) Gait Pattern/deviations: Step-through pattern;Decreased weight shift to left;Decreased stride length;Trunk flexed Gait velocity: slow pattern   General Gait Details: pt able to increase WB on L LE this session and with improved gait mechanics and L foot clearance; cues for L heel strike and relaxing shoulders   Stairs Stairs: Yes Stairs assistance: Min assist Stair Management: No rails;Backwards;With walker Number of Stairs: 2 General stair  comments: educated on sequencing and technique; min A for RW stability  Wheelchair Mobility    Modified Rankin (Stroke Patients Only)       Balance     Sitting balance-Leahy Scale: Good       Standing balance-Leahy Scale: Fair                      Cognition Arousal/Alertness: Awake/alert Behavior During Therapy: WFL for tasks assessed/performed Overall Cognitive Status: Within Functional Limits for tasks assessed                      Exercises      General Comments General comments (skin integrity, edema, etc.): husband present for session      Pertinent Vitals/Pain Pain Assessment: 0-10 Pain Score: 4  Pain Location: L thigh Pain Descriptors / Indicators: Burning Pain Intervention(s): Limited activity within patient's tolerance;Monitored during session;Premedicated before session;Repositioned    Home Living Family/patient expects to be discharged to:: Private residence Living Arrangements: Spouse/significant other Available Help at Discharge: Family;Available 24 hours/day Type of Home: House Home Access: Stairs to enter Entrance Stairs-Rails: None Home Layout: Able to live on main level with bedroom/bathroom;Two level Home Equipment: Walker - 2 wheels;Bedside commode;Cane - single point;Shower seat - built Investment banker, operational      Prior Function Level of Independence: Needs assistance    ADL's / Homemaking Assistance Needed: assist with cleaning     PT Goals (current goals can now be found in the care plan section) Acute Rehab PT Goals Patient Stated Goal: get home PT Goal Formulation: With patient Time For Goal Achievement: 04/05/16 Potential to Achieve Goals: Good Progress towards PT goals:  Progressing toward goals    Frequency  7X/week    PT Plan Current plan remains appropriate    Co-evaluation             End of Session Equipment Utilized During Treatment: Gait belt Activity Tolerance: Patient tolerated treatment  well Patient left: with call bell/phone within reach;in bed;with family/visitor present     Time: [id number] PT Time Calculation (min) (ACUTE ONLY): 19 min  Charges:  $Gait Training: 8-22 mins                    G Codes:      Salina April, PTA Pager: [phone]   03/23/2016, 4:10 PM

## 2016-03-23 NOTE — Progress Notes (Signed)
Late entry for evaluation performed on 03/22/16.   Pt is s/p TKA resulting in the deficits listed below (see PT Problem List). Pt will benefit from skilled PT to increase their independence and safety with mobility to allow discharge to the venue listed below.   03/22/16 1421  PT Visit Information  Last PT Received On 03/22/16  Assistance Needed +1  History of Present Illness 70 y.o. female s/p Lt anterior THA. PMH: Rt THA, asthma, depression, chronic low back pain, anxiety.   Precautions  Precautions Fall  Precaution Comments no hip precautions  Restrictions  Weight Bearing Restrictions Yes  LLE Weight Bearing WBAT  Home Living  Family/patient expects to be discharged to: Private residence  Living Arrangements Spouse/significant other  Available Help at Discharge Family;Available 24 hours/day  Type of Home House  Home Access Stairs to enter  Entrance Stairs-Number of Steps 2  Entrance Stairs-Rails None  Home Layout Able to live on main level with bedroom/bathroom;Two level  Alternate Level Stairs-Number of Steps flight  Home Equipment Walker - 2 wheels;BSC;Cane - single point  Prior Function  Level of Independence Independent  Communication  Communication No difficulties  Pain Assessment  Pain Assessment 0-10  Pain Score 9  Pain Location Lt groin  Pain Descriptors / Indicators Aching;Sharp  Pain Intervention(s) Limited activity within patient's tolerance;Monitored during session;RN gave pain meds during session  Cognition  Arousal/Alertness Lethargic  Behavior During Therapy [facility] for tasks assessed/performed  Overall Cognitive Status Within Functional Limits for tasks assessed  Upper Extremity Assessment  Upper Extremity Assessment Overall WFL for tasks assessed  Lower Extremity Assessment  Lower Extremity Assessment LLE deficits/detail  LLE Deficits / Details pt able to raise and move actively with bed mobility  Bed Mobility  Overal bed mobility Needs Assistance  Bed  Mobility Supine to Sit  Supine to sit Supervision;HOB elevated (using rail to assist)  Transfers  Overall transfer level Needs assistance  Equipment used Rolling walker (2 wheeled)  Transfers Sit to/from Stand  Sit to Stand Min guard  General transfer comment reminder for hand placement  Ambulation/Gait  Ambulation/Gait assistance Min guard  Ambulation Distance (Feet) 10 Feet  Assistive device Rolling walker (2 wheeled)  Gait Pattern/deviations Step-to pattern;Decreased stance time - left;Decreased weight shift to left  General Gait Details Pt limited by reports of pain. Requesting to sit following gait.   Gait velocity slow pattern  Balance  Overall balance assessment Needs assistance  Sitting-balance support No upper extremity supported  Sitting balance-Leahy Scale Good  Standing balance support Bilateral upper extremity supported  Standing balance-Leahy Scale Poor  Standing balance comment using rw for support  PT - End of Session  Equipment Utilized During Treatment Gait belt  Activity Tolerance Patient limited by pain;Patient limited by lethargy  Patient left in chair;with call bell/phone within reach;with nursing/sitter in room;with family/visitor present  Nurse Communication Mobility status  PT Assessment  PT Therapy Diagnosis  Difficulty walking  PT Recommendation/Assessment Patient needs continued PT services  PT Problem List Decreased strength;Decreased range of motion;Decreased activity tolerance;Decreased balance;Decreased mobility  PT Plan  PT Frequency (ACUTE ONLY) 7X/week  PT Treatment/Interventions (ACUTE ONLY) DME instruction;Gait training;Stair training;Functional mobility training;Therapeutic activities;Therapeutic exercise;Patient/family education  PT Recommendation  Follow Up Recommendations Home health PT;Supervision for mobility/OOB  PT equipment None recommended by PT  Individuals Consulted  Consulted and Agree with Results and Recommendations Patient   Acute Rehab PT Goals  Patient Stated Goal less pain, walk without pain  PT Goal Formulation With patient  Time For Goal Achievement 04/05/16  Potential to Achieve Emelia Salisbury  PT Time Calculation  PT Start Time (ACUTE ONLY) 1353  PT Stop Time (ACUTE ONLY) 1418  PT Time Calculation (min) (ACUTE ONLY) 25 min  PT General Charges  $$ ACUTE PT VISIT 1 Procedure  PT Evaluation  $PT Eval Moderate Complexity 1 Procedure  PT Treatments  $Therapeutic Activity 8-22 mins  Cassell Clement, PT, CSCS Pager [phone] Office [phone]

## 2016-03-24 MED ORDER — PROMETHAZINE HCL 12.5 MG PO TABS: 12.5000 mg | ORAL_TABLET | Freq: Four times a day (QID) | ORAL | Status: DC | PRN

## 2016-03-24 MED ORDER — HYDROCODONE-ACETAMINOPHEN 5-325 MG PO TABS: 1.0000 | ORAL_TABLET | ORAL | Status: DC | PRN

## 2016-03-24 MED ORDER — METHOCARBAMOL 500 MG PO TABS: 500.0000 mg | ORAL_TABLET | Freq: Four times a day (QID) | ORAL | Status: DC | PRN

## 2016-03-24 MED ORDER — ASPIRIN 325 MG PO TBEC: 325.0000 mg | DELAYED_RELEASE_TABLET | Freq: Two times a day (BID) | ORAL | Status: DC

## 2016-03-24 NOTE — Progress Notes (Signed)
Physical Therapy Treatment Patient Details Name: Kimberly Lowery MRN: [id number] DOB: Aug 31, 1946 Today's Date: 03/24/2016    History of Present Illness 70 y.o. female s/p Lt anterior THA. PMH: Rt THA, asthma, depression, chronic low back pain, anxiety.     PT Comments    Pt making gradual progress with PT regarding mobility. Pt able to ambulate 180 ft with rw and min guard assist. Pt confirms that her husband will be home to help her as needed. Following session, pt denies any questions or concerns. Anticipate D/C to home once released.   Follow Up Recommendations  Home health PT;Supervision for mobility/OOB     Equipment Recommendations  None recommended by PT    Recommendations for Other Services       Precautions / Restrictions Precautions Precautions: Fall Precaution Comments: no hip precautions Restrictions Weight Bearing Restrictions: Yes LLE Weight Bearing: Weight bearing as tolerated    Mobility  Bed Mobility Overal bed mobility: Needs Assistance Bed Mobility: Supine to Sit     Supine to sit: Supervision     General bed mobility comments: cues to scoot forward to get feet on floor, encouraging decreased use of rail.  Transfers Overall transfer level: Needs assistance Equipment used: Rolling walker (2 wheeled) Transfers: Sit to/from Stand Sit to Stand: Min guard         General transfer comment: cues to fully turn prior to sitting for safety. Transfers performed from EOB and BSC.   Ambulation/Gait Ambulation/Gait assistance: Supervision Ambulation Distance (Feet): 180 Feet Assistive device: Rolling walker (2 wheeled) Gait Pattern/deviations: Step-through pattern;Decreased weight shift to left Gait velocity: slow pattern   General Gait Details: cues for posture and even weightbearing, initially with difficulty clearing LLE with swing phase but improving throughout session.    Stairs         General stair comments: pt declined reviewing stairs,  reports feeling confident.   Wheelchair Mobility    Modified Rankin (Stroke Patients Only)       Balance Overall balance assessment: Needs assistance Sitting-balance support: No upper extremity supported Sitting balance-Leahy Scale: Good     Standing balance support: Bilateral upper extremity supported Standing balance-Leahy Scale: Poor Standing balance comment: using rw for support                    Cognition Arousal/Alertness: Awake/alert Behavior During Therapy: WFL for tasks assessed/performed Overall Cognitive Status: Within Functional Limits for tasks assessed                      Exercises Total Joint Exercises Ankle Circles/Pumps: AROM;Both;15 reps Quad Sets: Strengthening;Both;10 reps;Supine Short Arc Quad: Strengthening;Left;10 reps Heel Slides: AAROM;Left;10 reps Hip ABduction/ADduction: Strengthening;Left;10 reps    General Comments        Pertinent Vitals/Pain Pain Assessment: 0-10 Pain Score: 5  Pain Location: Lt thigh, headache Pain Descriptors / Indicators: Aching;Burning Pain Intervention(s): Limited activity within patient's tolerance;Monitored during session    Home Living                      Prior Function            PT Goals (current goals can now be found in the care plan section) Acute Rehab PT Goals Patient Stated Goal: get home PT Goal Formulation: With patient Time For Goal Achievement: 04/05/16 Potential to Achieve Goals: Good Progress towards PT goals: Progressing toward goals    Frequency  7X/week    PT Plan Current plan  remains appropriate    Co-evaluation             End of Session Equipment Utilized During Treatment: Gait belt Activity Tolerance: Patient tolerated treatment well Patient left: in chair;with call bell/phone within reach     Time: 0833-0906 PT Time Calculation (min) (ACUTE ONLY): 33 min  Charges:  $Gait Training: 8-22 mins $Therapeutic Exercise: 8-22 mins                     G Codes:      Cassell Clement, PT, CSCS Pager [phone] Office [phone]  03/24/2016, 9:16 AM

## 2016-03-24 NOTE — Care Management Note (Signed)
Case Management Note  Patient Details  Name: Kimberly Lowery MRN: [id number] Date of Birth: 04/19/1946  Subjective/Objective:    70 yr old female s/p left anterior hip arthroplasty.                Action/Plan: Case manager spoke with patient concerning home health and DME needs. Choice was offered. Patient was preoperatively setup with [facility], no changes. Patient has family support at discharge.    Expected Discharge Date:   03/24/16               Expected Discharge Plan:  [place]  In-House Referral:     Discharge planning Services  CM Consult  Post Acute Care Choice:  Home Health Choice offered to:  Patient  DME Arranged:  N/A DME Agency:  NA  HH Arranged:  PT [place] Agency:  [facility]  Status of Service:  Completed, signed off  If discussed at [place] of Stay Meetings, dates discussed:    Additional Comments:  Ninfa Meeker, RN 03/24/2016, 2:39 PM

## 2016-03-24 NOTE — Discharge Summary (Signed)
Patient ID: Kimberly Lowery MRN: [id number] DOB/AGE: 1945-12-17 70 y.o.  Admit date: 03/22/2016 Discharge date: 03/24/2016  Admission Diagnoses:  Principal Problem:   Primary osteoarthritis of left hip   Discharge Diagnoses:  Same  Past Medical History  Diagnosis Date  . Asthma   . Depression   . GERD (gastroesophageal reflux disease)   . H/O hiatal hernia   . Anemia   . Blood dyscrasia     grandfather had hx bone marrow Ca  . Family history of anesthesia complication     "my aunt didn't go out during OR"  . High cholesterol   . Chronic lower back pain   . Anxiety   . Obesity   . Fibrocystic breast disease   . IBS (irritable bowel syndrome)   . PONV (postoperative nausea and vomiting)     bad after last hip replacement  . Heart murmur     no problem, stated a bruit in right carotid -states she has a ultrasound scan every year  . Sleep apnea     "never RX mask" (03/22/2016)  . Arthritis     "some in my back; was in my hips" (03/22/2016)    Surgeries: Procedure(s): TOTAL HIP ARTHROPLASTY ANTERIOR APPROACH on 03/22/2016   Consultants:    Discharged Condition: Improved  Hospital Course: Kimberly Lowery is an 70 y.o. female who was admitted 03/22/2016 for operative treatment ofPrimary osteoarthritis of left hip. Patient has severe unremitting pain that affects sleep, daily activities, and work/hobbies. After pre-op clearance the patient was taken to the operating room on 03/22/2016 and underwent  Procedure(s): TOTAL HIP ARTHROPLASTY ANTERIOR APPROACH.    Patient was given perioperative antibiotics: Anti-infectives    Start     Dose/Rate Route Frequency Ordered Stop   03/22/16 1300  doxycycline (VIBRAMYCIN) 50 MG/5ML syrup 20 mg     20 mg Oral 2 times daily 03/22/16 1122     03/22/16 1300  ceFAZolin (ANCEF) IVPB 2g/100 mL premix     2 g 200 mL/hr over 30 Minutes Intravenous Every 6 hours 03/22/16 1122 03/22/16 2226   03/22/16 0700  ceFAZolin (ANCEF) IVPB 2g/100 mL premix   Status:  Discontinued     2 g 200 mL/hr over 30 Minutes Intravenous To [facility] 03/21/16 1409 03/22/16 1122       Patient was given sequential compression devices, early ambulation, and chemoprophylaxis to prevent DVT.  Patient benefited maximally from hospital stay and there were no complications.    Recent vital signs: Patient Vitals for the past 24 hrs:  BP Temp Temp src Pulse Resp SpO2  03/24/16 0516 (!) 132/55 mmHg 98.2 F (36.8 C) Oral 73 - 97 %  03/23/16 1955 (!) 141/59 mmHg 98.6 F (37 C) Oral 96 - 96 %  03/23/16 1828 (!) 151/71 mmHg 98.4 F (36.9 C) Oral 94 - -  03/23/16 1300 (!) 148/58 mmHg 97.6 F (36.4 C) - 98 18 99 %     Recent laboratory studies: No results for input(s): WBC, HGB, HCT, PLT, NA, K, CL, CO2, BUN, CREATININE, GLUCOSE, INR, CALCIUM in the last 72 hours.  Invalid input(s): PT, 2   Discharge Medications:     Medication List    STOP taking these medications        aspirin 81 MG tablet  Replaced by:  aspirin 325 MG EC tablet      TAKE these medications        aspirin 325 MG EC tablet  Take 1 tablet (325  mg total) by mouth 2 (two) times daily after a meal.     ciclopirox 8 % solution  Commonly known as:  PENLAC  Apply 1 application topically at bedtime. Apply over nail and surrounding skin. Apply daily over previous coat. After seven (7) days, may remove with alcohol and continue cycle.     clindamycin 1 % lotion  Commonly known as:  CLEOCIN T  Apply 1 application topically daily. To rosacea     desonide 0.05 % ointment  Commonly known as:  DESOWEN  Apply 1 application topically daily as needed (rash on neck).     doxycycline 20 MG tablet  Commonly known as:  PERIOSTAT  Take 20 mg by mouth 2 (two) times daily.     FLUoxetine 20 MG capsule  Commonly known as:  PROZAC  Take 20 mg by mouth daily.     HYDROcodone-acetaminophen 5-325 MG tablet  Commonly known as:  NORCO/VICODIN  Take 1-2 tablets by mouth every 4 (four) hours  as needed (breakthrough pain).     methocarbamol 500 MG tablet  Commonly known as:  ROBAXIN  Take 1 tablet (500 mg total) by mouth every 6 (six) hours as needed for muscle spasms.     omeprazole 20 MG tablet  Commonly known as:  PRILOSEC OTC  Take 20 mg by mouth daily.     promethazine 12.5 MG tablet  Commonly known as:  PHENERGAN  Take 1-2 tablets (12.5-25 mg total) by mouth every 6 (six) hours as needed for nausea or vomiting.     rosuvastatin 10 MG tablet  Commonly known as:  CRESTOR  Take 10 mg by mouth daily.     urea 40 % Crea  Commonly known as:  CARMOL  Apply 1 application topically as needed.     Vitamin D3 2000 units capsule  Take 5,000 Units by mouth daily.        Diagnostic Studies: Dg Chest 2 View  03/10/2016  CLINICAL DATA:  Preop left hip surgery EXAM: CHEST  2 VIEW COMPARISON:  03/07/2014 FINDINGS: Lungs are clear.  No pleural effusion or pneumothorax. The heart is normal in size. Visualized osseous structures are within normal limits. Cholecystectomy clips. IMPRESSION: Normal chest radiographs. Electronically Signed   By: Julian Hy M.D.   On: 03/10/2016 16:21   Dg Hip Operative Unilat W Or W/o Pelvis Left  03/22/2016  CLINICAL DATA:  Left hip replacement.  Intraoperative imaging. EXAM: OPERATIVE LEFT HIP (WITH PELVIS IF PERFORMED) 2 VIEWS TECHNIQUE: Fluoroscopic spot image(s) were submitted for interpretation post-operatively. COMPARISON:  None. FINDINGS: Imaging demonstrates a left total hip arthroplasty in place. The device is located. No fracture is identified. IMPRESSION: Left hip replacement.  No acute abnormality. Electronically Signed   By: Inge Rise M.D.   On: 03/22/2016 09:26    Disposition: 06-Home-Health Care Svc      Discharge Instructions    Call MD / Call 911    Complete by:  As directed   If you experience chest pain or shortness of breath, CALL 911 and be transported to the hospital emergency room.  If you develope a fever above  101 F, pus (white drainage) or increased drainage or redness at the wound, or calf pain, call your surgeon's office.     Constipation Prevention    Complete by:  As directed   Drink plenty of fluids.  Prune juice may be helpful.  You may use a stool softener, such as Colace (over the counter) 100 mg  twice a day.  Use MiraLax (over the counter) for constipation as needed.     Diet - low sodium heart healthy    Complete by:  As directed      Discharge instructions    Complete by:  As directed   INSTRUCTIONS AFTER JOINT REPLACEMENT   Remove items at home which could result in a fall. This includes throw rugs or furniture in walking pathways ICE to the affected joint every three hours while awake for 30 minutes at a time, for at least the first 3-5 days, and then as needed for pain and swelling.  Continue to use ice for pain and swelling. You may notice swelling that will progress down to the foot and ankle.  This is normal after surgery.  Elevate your leg when you are not up walking on it.   Continue to use the breathing machine you got in the hospital (incentive spirometer) which will help keep your temperature down.  It is common for your temperature to cycle up and down following surgery, especially at night when you are not up moving around and exerting yourself.  The breathing machine keeps your lungs expanded and your temperature down.   DIET:  As you were doing prior to hospitalization, we recommend a well-balanced diet.  DRESSING / WOUND CARE / SHOWERING  You may shower 3 days after surgery, but keep the wounds dry during showering.  You may use an occlusive plastic wrap (Press'n Seal for example), NO SOAKING/SUBMERGING IN THE BATHTUB.  If the bandage gets wet, change with a clean dry gauze.  If the incision gets wet, pat the wound dry with a clean towel.  ACTIVITY  Increase activity slowly as tolerated, but follow the weight bearing instructions below.   No driving for 6 weeks or until  further direction given by your physician.  You cannot drive while taking narcotics.  No lifting or carrying greater than 10 lbs. until further directed by your surgeon. Avoid periods of inactivity such as sitting longer than an hour when not asleep. This helps prevent blood clots.  You may return to work once you are authorized by your doctor.     WEIGHT BEARING   Weight bearing as tolerated with assist device (walker, cane, etc) as directed, use it as long as suggested by your surgeon or therapist, typically at least 4-6 weeks.   EXERCISES  Results after joint replacement surgery are often greatly improved when you follow the exercise, range of motion and muscle strengthening exercises prescribed by your doctor. Safety measures are also important to protect the joint from further injury. Any time any of these exercises cause you to have increased pain or swelling, decrease what you are doing until you are comfortable again and then slowly increase them. If you have problems or questions, call your caregiver or physical therapist for advice.   Rehabilitation is important following a joint replacement. After just a few days of immobilization, the muscles of the leg can become weakened and shrink (atrophy).  These exercises are designed to build up the tone and strength of the thigh and leg muscles and to improve motion. Often times heat used for twenty to thirty minutes before working out will loosen up your tissues and help with improving the range of motion but do not use heat for the first two weeks following surgery (sometimes heat can increase post-operative swelling).   These exercises can be done on a training (exercise) mat, on the floor, on a  table or on a bed. Use whatever works the best and is most comfortable for you.    Use music or television while you are exercising so that the exercises are a pleasant break in your day. This will make your life better with the exercises acting as a  break in your routine that you can look forward to.   Perform all exercises about fifteen times, three times per day or as directed.  You should exercise both the operative leg and the other leg as well.   Exercises include:   Quad Sets - Tighten up the muscle on the front of the thigh (Quad) and hold for 5-10 seconds.   Straight Leg Raises - With your knee straight (if you were given a brace, keep it on), lift the leg to 60 degrees, hold for 3 seconds, and slowly lower the leg.  Perform this exercise against resistance later as your leg gets stronger.  Leg Slides: Lying on your back, slowly slide your foot toward your buttocks, bending your knee up off the floor (only go as far as is comfortable). Then slowly slide your foot back down until your leg is flat on the floor again.  Angel Wings: Lying on your back spread your legs to the side as far apart as you can without causing discomfort.  Hamstring Strength:  Lying on your back, push your heel against the floor with your leg straight by tightening up the muscles of your buttocks.  Repeat, but this time bend your knee to a comfortable angle, and push your heel against the floor.  You may put a pillow under the heel to make it more comfortable if necessary.   A rehabilitation program following joint replacement surgery can speed recovery and prevent re-injury in the future due to weakened muscles. Contact your doctor or a physical therapist for more information on knee rehabilitation.    CONSTIPATION  Constipation is defined medically as fewer than three stools per week and severe constipation as less than one stool per week.  Even if you have a regular bowel pattern at home, your normal regimen is likely to be disrupted due to multiple reasons following surgery.  Combination of anesthesia, postoperative narcotics, change in appetite and fluid intake all can affect your bowels.   YOU MUST use at least one of the following options; they are listed in  order of increasing strength to get the job done.  They are all available over the counter, and you may need to use some, POSSIBLY even all of these options:    Drink plenty of fluids (prune juice may be helpful) and high fiber foods Colace 100 mg by mouth twice a day  Senokot for constipation as directed and as needed Dulcolax (bisacodyl), take with full glass of water  Miralax (polyethylene glycol) once or twice a day as needed.  If you have tried all these things and are unable to have a bowel movement in the first 3-4 days after surgery call either your surgeon or your primary doctor.    If you experience loose stools or diarrhea, hold the medications until you stool forms back up.  If your symptoms do not get better within 1 week or if they get worse, check with your doctor.  If you experience "the worst abdominal pain ever" or develop nausea or vomiting, please contact the office immediately for further recommendations for treatment.   ITCHING:  If you experience itching with your medications, try taking only a single  pain pill, or even half a pain pill at a time.  You can also use Benadryl over the counter for itching or also to help with sleep.   TED HOSE STOCKINGS:  Use stockings on both legs until for at least 2 weeks or as directed by physician office. They may be removed at night for sleeping.  MEDICATIONS:  See your medication summary on the "After Visit Summary" that nursing will review with you.  You may have some home medications which will be placed on hold until you complete the course of blood thinner medication.  It is important for you to complete the blood thinner medication as prescribed.  PRECAUTIONS:  If you experience chest pain or shortness of breath - call 911 immediately for transfer to the hospital emergency department.   If you develop a fever greater that 101 F, purulent drainage from wound, increased redness or drainage from wound, foul odor from the  wound/dressing, or calf pain - CONTACT YOUR SURGEON.                                                   FOLLOW-UP APPOINTMENTS:  If you do not already have a post-op appointment, please call the office for an appointment to be seen by your surgeon.  Guidelines for how soon to be seen are listed in your "After Visit Summary", but are typically between 1-4 weeks after surgery.  OTHER INSTRUCTIONS:   Knee Replacement:  Do not place pillow under knee, focus on keeping the knee straight while resting. CPM instructions: 0-90 degrees, 2 hours in the morning, 2 hours in the afternoon, and 2 hours in the evening. Place foam block, curve side up under heel at all times except when in CPM or when walking.  DO NOT modify, tear, cut, or change the foam block in any way.  MAKE SURE YOU:  Understand these instructions.  Get help right away if you are not doing well or get worse.    Thank you for letting us be a part of your medical care team.  It is a privilege we respect greatly.  We hope these instructions will help you stay on track for a fast and full recovery!     Increase activity slowly as tolerated    Complete by:  As directed            Follow-up Information    Follow up with Hessie Dibble, MD. Schedule an appointment as soon as possible for a visit in 2 weeks.   Specialty:  Orthopedic Surgery   Contact information:   [address] [place] [id number] [phone]       Follow up with [facility].   Why:  Someone from [facility] will contact you to arrange start date and time for therapy.   Contact information:   [address] [place] [address] [phone]        Signed: Rich Fuchs 03/24/2016, 7:23 AM

## 2016-03-24 NOTE — Progress Notes (Signed)
Discharge instructions given. Pt verbalized understanding and all questions were answered.  

## 2016-03-25 DIAGNOSIS — E669 Obesity, unspecified: Secondary | ICD-10-CM | POA: Diagnosis not present

## 2016-03-25 DIAGNOSIS — G4730 Sleep apnea, unspecified: Secondary | ICD-10-CM | POA: Diagnosis not present

## 2016-03-25 DIAGNOSIS — Z471 Aftercare following joint replacement surgery: Secondary | ICD-10-CM | POA: Diagnosis not present

## 2016-03-25 DIAGNOSIS — D649 Anemia, unspecified: Secondary | ICD-10-CM | POA: Diagnosis not present

## 2016-03-25 DIAGNOSIS — F419 Anxiety disorder, unspecified: Secondary | ICD-10-CM | POA: Diagnosis not present

## 2016-03-25 DIAGNOSIS — Z7982 Long term (current) use of aspirin: Secondary | ICD-10-CM | POA: Diagnosis not present

## 2016-03-25 DIAGNOSIS — Z96642 Presence of left artificial hip joint: Secondary | ICD-10-CM | POA: Diagnosis not present

## 2016-03-25 DIAGNOSIS — K589 Irritable bowel syndrome without diarrhea: Secondary | ICD-10-CM | POA: Diagnosis not present

## 2016-03-25 DIAGNOSIS — J45909 Unspecified asthma, uncomplicated: Secondary | ICD-10-CM | POA: Diagnosis not present

## 2016-03-25 DIAGNOSIS — F329 Major depressive disorder, single episode, unspecified: Secondary | ICD-10-CM | POA: Diagnosis not present

## 2016-04-06 DIAGNOSIS — M1612 Unilateral primary osteoarthritis, left hip: Secondary | ICD-10-CM | POA: Diagnosis not present

## 2016-04-12 DIAGNOSIS — M25552 Pain in left hip: Secondary | ICD-10-CM | POA: Diagnosis not present

## 2016-04-12 DIAGNOSIS — M7072 Other bursitis of hip, left hip: Secondary | ICD-10-CM | POA: Diagnosis not present

## 2016-04-15 DIAGNOSIS — M7072 Other bursitis of hip, left hip: Secondary | ICD-10-CM | POA: Diagnosis not present

## 2016-04-15 DIAGNOSIS — M25552 Pain in left hip: Secondary | ICD-10-CM | POA: Diagnosis not present

## 2016-04-20 DIAGNOSIS — M25552 Pain in left hip: Secondary | ICD-10-CM | POA: Diagnosis not present

## 2016-04-20 DIAGNOSIS — M7072 Other bursitis of hip, left hip: Secondary | ICD-10-CM | POA: Diagnosis not present

## 2016-04-22 DIAGNOSIS — M7072 Other bursitis of hip, left hip: Secondary | ICD-10-CM | POA: Diagnosis not present

## 2016-04-22 DIAGNOSIS — M25552 Pain in left hip: Secondary | ICD-10-CM | POA: Diagnosis not present

## 2016-04-26 DIAGNOSIS — M7072 Other bursitis of hip, left hip: Secondary | ICD-10-CM | POA: Diagnosis not present

## 2016-04-26 DIAGNOSIS — M25552 Pain in left hip: Secondary | ICD-10-CM | POA: Diagnosis not present

## 2016-04-27 DIAGNOSIS — M1612 Unilateral primary osteoarthritis, left hip: Secondary | ICD-10-CM | POA: Diagnosis not present

## 2016-05-02 DIAGNOSIS — M7072 Other bursitis of hip, left hip: Secondary | ICD-10-CM | POA: Diagnosis not present

## 2016-05-02 DIAGNOSIS — M25552 Pain in left hip: Secondary | ICD-10-CM | POA: Diagnosis not present

## 2016-05-04 DIAGNOSIS — M7072 Other bursitis of hip, left hip: Secondary | ICD-10-CM | POA: Diagnosis not present

## 2016-05-04 DIAGNOSIS — M25552 Pain in left hip: Secondary | ICD-10-CM | POA: Diagnosis not present

## 2016-05-23 DIAGNOSIS — M25552 Pain in left hip: Secondary | ICD-10-CM | POA: Diagnosis not present

## 2016-05-23 DIAGNOSIS — M7072 Other bursitis of hip, left hip: Secondary | ICD-10-CM | POA: Diagnosis not present

## 2016-05-27 DIAGNOSIS — M25552 Pain in left hip: Secondary | ICD-10-CM | POA: Diagnosis not present

## 2016-05-27 DIAGNOSIS — M7072 Other bursitis of hip, left hip: Secondary | ICD-10-CM | POA: Diagnosis not present

## 2016-05-31 DIAGNOSIS — M7072 Other bursitis of hip, left hip: Secondary | ICD-10-CM | POA: Diagnosis not present

## 2016-05-31 DIAGNOSIS — M25552 Pain in left hip: Secondary | ICD-10-CM | POA: Diagnosis not present

## 2016-06-03 DIAGNOSIS — M7072 Other bursitis of hip, left hip: Secondary | ICD-10-CM | POA: Diagnosis not present

## 2016-06-03 DIAGNOSIS — M25552 Pain in left hip: Secondary | ICD-10-CM | POA: Diagnosis not present

## 2016-06-08 DIAGNOSIS — M25552 Pain in left hip: Secondary | ICD-10-CM | POA: Diagnosis not present

## 2016-06-08 DIAGNOSIS — M7072 Other bursitis of hip, left hip: Secondary | ICD-10-CM | POA: Diagnosis not present

## 2016-06-10 DIAGNOSIS — M7072 Other bursitis of hip, left hip: Secondary | ICD-10-CM | POA: Diagnosis not present

## 2016-06-10 DIAGNOSIS — M25552 Pain in left hip: Secondary | ICD-10-CM | POA: Diagnosis not present

## 2016-06-14 DIAGNOSIS — M7072 Other bursitis of hip, left hip: Secondary | ICD-10-CM | POA: Diagnosis not present

## 2016-06-14 DIAGNOSIS — M25552 Pain in left hip: Secondary | ICD-10-CM | POA: Diagnosis not present

## 2016-06-16 ENCOUNTER — Encounter (HOSPITAL_COMMUNITY): Payer: PPO

## 2016-06-17 DIAGNOSIS — M7072 Other bursitis of hip, left hip: Secondary | ICD-10-CM | POA: Diagnosis not present

## 2016-06-17 DIAGNOSIS — M25552 Pain in left hip: Secondary | ICD-10-CM | POA: Diagnosis not present

## 2016-06-20 DIAGNOSIS — M7072 Other bursitis of hip, left hip: Secondary | ICD-10-CM | POA: Diagnosis not present

## 2016-06-20 DIAGNOSIS — M25552 Pain in left hip: Secondary | ICD-10-CM | POA: Diagnosis not present

## 2016-06-23 DIAGNOSIS — M25552 Pain in left hip: Secondary | ICD-10-CM | POA: Diagnosis not present

## 2016-06-23 DIAGNOSIS — M7072 Other bursitis of hip, left hip: Secondary | ICD-10-CM | POA: Diagnosis not present

## 2016-06-27 DIAGNOSIS — M25552 Pain in left hip: Secondary | ICD-10-CM | POA: Diagnosis not present

## 2016-06-27 DIAGNOSIS — M7072 Other bursitis of hip, left hip: Secondary | ICD-10-CM | POA: Diagnosis not present

## 2016-06-29 DIAGNOSIS — M1612 Unilateral primary osteoarthritis, left hip: Secondary | ICD-10-CM | POA: Diagnosis not present

## 2016-07-01 DIAGNOSIS — M7072 Other bursitis of hip, left hip: Secondary | ICD-10-CM | POA: Diagnosis not present

## 2016-07-01 DIAGNOSIS — M25552 Pain in left hip: Secondary | ICD-10-CM | POA: Diagnosis not present

## 2016-07-04 DIAGNOSIS — M25552 Pain in left hip: Secondary | ICD-10-CM | POA: Diagnosis not present

## 2016-07-04 DIAGNOSIS — M7072 Other bursitis of hip, left hip: Secondary | ICD-10-CM | POA: Diagnosis not present

## 2016-07-06 DIAGNOSIS — M25552 Pain in left hip: Secondary | ICD-10-CM | POA: Diagnosis not present

## 2016-07-06 DIAGNOSIS — M7072 Other bursitis of hip, left hip: Secondary | ICD-10-CM | POA: Diagnosis not present

## 2016-07-07 DIAGNOSIS — Z86018 Personal history of other benign neoplasm: Secondary | ICD-10-CM | POA: Diagnosis not present

## 2016-07-07 DIAGNOSIS — D1801 Hemangioma of skin and subcutaneous tissue: Secondary | ICD-10-CM | POA: Diagnosis not present

## 2016-07-07 DIAGNOSIS — D225 Melanocytic nevi of trunk: Secondary | ICD-10-CM | POA: Diagnosis not present

## 2016-07-07 DIAGNOSIS — D2272 Melanocytic nevi of left lower limb, including hip: Secondary | ICD-10-CM | POA: Diagnosis not present

## 2016-07-07 DIAGNOSIS — Z85828 Personal history of other malignant neoplasm of skin: Secondary | ICD-10-CM | POA: Diagnosis not present

## 2016-07-07 DIAGNOSIS — Z23 Encounter for immunization: Secondary | ICD-10-CM | POA: Diagnosis not present

## 2016-07-12 DIAGNOSIS — M25552 Pain in left hip: Secondary | ICD-10-CM | POA: Diagnosis not present

## 2016-07-12 DIAGNOSIS — M7072 Other bursitis of hip, left hip: Secondary | ICD-10-CM | POA: Diagnosis not present

## 2016-07-15 DIAGNOSIS — M25552 Pain in left hip: Secondary | ICD-10-CM | POA: Diagnosis not present

## 2016-07-15 DIAGNOSIS — M7072 Other bursitis of hip, left hip: Secondary | ICD-10-CM | POA: Diagnosis not present

## 2016-07-22 DIAGNOSIS — M7072 Other bursitis of hip, left hip: Secondary | ICD-10-CM | POA: Diagnosis not present

## 2016-07-22 DIAGNOSIS — M25552 Pain in left hip: Secondary | ICD-10-CM | POA: Diagnosis not present

## 2016-07-26 DIAGNOSIS — Z6826 Body mass index (BMI) 26.0-26.9, adult: Secondary | ICD-10-CM | POA: Diagnosis not present

## 2016-07-26 DIAGNOSIS — M545 Low back pain: Secondary | ICD-10-CM | POA: Diagnosis not present

## 2016-07-27 DIAGNOSIS — H43812 Vitreous degeneration, left eye: Secondary | ICD-10-CM | POA: Diagnosis not present

## 2016-07-27 DIAGNOSIS — H43811 Vitreous degeneration, right eye: Secondary | ICD-10-CM | POA: Diagnosis not present

## 2016-07-27 DIAGNOSIS — H25813 Combined forms of age-related cataract, bilateral: Secondary | ICD-10-CM | POA: Diagnosis not present

## 2016-08-03 DIAGNOSIS — M791 Myalgia: Secondary | ICD-10-CM | POA: Diagnosis not present

## 2016-11-14 DIAGNOSIS — R2232 Localized swelling, mass and lump, left upper limb: Secondary | ICD-10-CM | POA: Diagnosis not present

## 2016-11-18 ENCOUNTER — Other Ambulatory Visit: Payer: Self-pay

## 2016-11-18 DIAGNOSIS — R2232 Localized swelling, mass and lump, left upper limb: Secondary | ICD-10-CM | POA: Diagnosis not present

## 2016-11-18 DIAGNOSIS — D2112 Benign neoplasm of connective and other soft tissue of left upper limb, including shoulder: Secondary | ICD-10-CM | POA: Diagnosis not present

## 2016-11-18 DIAGNOSIS — D492 Neoplasm of unspecified behavior of bone, soft tissue, and skin: Secondary | ICD-10-CM | POA: Diagnosis not present

## 2016-11-18 DIAGNOSIS — M799 Soft tissue disorder, unspecified: Secondary | ICD-10-CM | POA: Diagnosis not present

## 2016-11-21 ENCOUNTER — Other Ambulatory Visit: Payer: Self-pay | Admitting: Internal Medicine

## 2016-11-21 DIAGNOSIS — Z1231 Encounter for screening mammogram for malignant neoplasm of breast: Secondary | ICD-10-CM

## 2016-11-30 DIAGNOSIS — R2232 Localized swelling, mass and lump, left upper limb: Secondary | ICD-10-CM | POA: Diagnosis not present

## 2017-01-03 ENCOUNTER — Ambulatory Visit: Payer: PPO

## 2017-01-11 ENCOUNTER — Other Ambulatory Visit: Payer: Self-pay | Admitting: Internal Medicine

## 2017-01-11 ENCOUNTER — Ambulatory Visit
Admission: RE | Admit: 2017-01-11 | Discharge: 2017-01-11 | Disposition: A | Discharge status: Home / Self Care | Payer: PPO | Source: Ambulatory Visit | Attending: Internal Medicine | Admitting: Internal Medicine

## 2017-01-11 DIAGNOSIS — K5730 Diverticulosis of large intestine without perforation or abscess without bleeding: Secondary | ICD-10-CM | POA: Diagnosis not present

## 2017-01-11 DIAGNOSIS — Z1389 Encounter for screening for other disorder: Secondary | ICD-10-CM | POA: Diagnosis not present

## 2017-01-11 DIAGNOSIS — J309 Allergic rhinitis, unspecified: Secondary | ICD-10-CM | POA: Diagnosis not present

## 2017-01-11 DIAGNOSIS — Z1231 Encounter for screening mammogram for malignant neoplasm of breast: Secondary | ICD-10-CM | POA: Diagnosis not present

## 2017-01-11 DIAGNOSIS — I6522 Occlusion and stenosis of left carotid artery: Secondary | ICD-10-CM | POA: Diagnosis not present

## 2017-01-11 DIAGNOSIS — Z79899 Other long term (current) drug therapy: Secondary | ICD-10-CM | POA: Diagnosis not present

## 2017-01-11 DIAGNOSIS — M6289 Other specified disorders of muscle: Secondary | ICD-10-CM | POA: Diagnosis not present

## 2017-01-11 DIAGNOSIS — Z0000 Encounter for general adult medical examination without abnormal findings: Secondary | ICD-10-CM | POA: Diagnosis not present

## 2017-01-11 DIAGNOSIS — F322 Major depressive disorder, single episode, severe without psychotic features: Secondary | ICD-10-CM | POA: Diagnosis not present

## 2017-01-11 DIAGNOSIS — E785 Hyperlipidemia, unspecified: Secondary | ICD-10-CM | POA: Diagnosis not present

## 2017-01-11 DIAGNOSIS — F329 Major depressive disorder, single episode, unspecified: Secondary | ICD-10-CM | POA: Diagnosis not present

## 2017-01-11 DIAGNOSIS — K219 Gastro-esophageal reflux disease without esophagitis: Secondary | ICD-10-CM | POA: Diagnosis not present

## 2017-01-11 DIAGNOSIS — Z86010 Personal history of colonic polyps: Secondary | ICD-10-CM | POA: Diagnosis not present

## 2017-01-11 DIAGNOSIS — E559 Vitamin D deficiency, unspecified: Secondary | ICD-10-CM | POA: Diagnosis not present

## 2017-01-11 DIAGNOSIS — Z1211 Encounter for screening for malignant neoplasm of colon: Secondary | ICD-10-CM | POA: Diagnosis not present

## 2017-01-17 ENCOUNTER — Ambulatory Visit
Admission: RE | Admit: 2017-01-17 | Discharge: 2017-01-17 | Disposition: A | Discharge status: Home / Self Care | Payer: PPO | Source: Ambulatory Visit | Attending: Internal Medicine | Admitting: Internal Medicine

## 2017-01-17 DIAGNOSIS — I6522 Occlusion and stenosis of left carotid artery: Secondary | ICD-10-CM

## 2017-01-17 DIAGNOSIS — I6523 Occlusion and stenosis of bilateral carotid arteries: Secondary | ICD-10-CM | POA: Diagnosis not present

## 2017-02-08 DIAGNOSIS — D2271 Melanocytic nevi of right lower limb, including hip: Secondary | ICD-10-CM | POA: Diagnosis not present

## 2017-02-08 DIAGNOSIS — D225 Melanocytic nevi of trunk: Secondary | ICD-10-CM | POA: Diagnosis not present

## 2017-02-08 DIAGNOSIS — Z808 Family history of malignant neoplasm of other organs or systems: Secondary | ICD-10-CM | POA: Diagnosis not present

## 2017-02-08 DIAGNOSIS — L299 Pruritus, unspecified: Secondary | ICD-10-CM | POA: Diagnosis not present

## 2017-02-08 DIAGNOSIS — L719 Rosacea, unspecified: Secondary | ICD-10-CM | POA: Diagnosis not present

## 2017-02-08 DIAGNOSIS — L304 Erythema intertrigo: Secondary | ICD-10-CM | POA: Diagnosis not present

## 2017-02-08 DIAGNOSIS — D2272 Melanocytic nevi of left lower limb, including hip: Secondary | ICD-10-CM | POA: Diagnosis not present

## 2017-02-08 DIAGNOSIS — Z85828 Personal history of other malignant neoplasm of skin: Secondary | ICD-10-CM | POA: Diagnosis not present

## 2017-02-08 DIAGNOSIS — Z86018 Personal history of other benign neoplasm: Secondary | ICD-10-CM | POA: Diagnosis not present

## 2017-02-08 DIAGNOSIS — M6740 Ganglion, unspecified site: Secondary | ICD-10-CM | POA: Diagnosis not present

## 2017-02-14 DIAGNOSIS — Z13820 Encounter for screening for osteoporosis: Secondary | ICD-10-CM | POA: Diagnosis not present

## 2017-02-14 DIAGNOSIS — Z780 Asymptomatic menopausal state: Secondary | ICD-10-CM | POA: Diagnosis not present

## 2017-03-29 DIAGNOSIS — M67441 Ganglion, right hand: Secondary | ICD-10-CM | POA: Diagnosis not present

## 2017-03-30 ENCOUNTER — Other Ambulatory Visit: Payer: Self-pay | Admitting: Orthopedic Surgery

## 2017-03-31 ENCOUNTER — Encounter (HOSPITAL_BASED_OUTPATIENT_CLINIC_OR_DEPARTMENT_OTHER): Payer: Self-pay | Admitting: *Deleted

## 2017-04-06 DIAGNOSIS — Z86010 Personal history of colonic polyps: Secondary | ICD-10-CM | POA: Diagnosis not present

## 2017-04-06 DIAGNOSIS — K640 First degree hemorrhoids: Secondary | ICD-10-CM | POA: Diagnosis not present

## 2017-04-06 DIAGNOSIS — D126 Benign neoplasm of colon, unspecified: Secondary | ICD-10-CM | POA: Diagnosis not present

## 2017-04-06 NOTE — H&P (Signed)
Kimberly Lowery is an 71 y.o. female.   CC / Reason for Visit: Mass left index finger HPI: This patient is a 71 year old, right-hand-dominant, female well known to [place] who has developed a mass on her left index finger dorsal DIP joint.  She states that she saw her dermatologist who aspirated the mass of a clear gelatin like substance on 2 separate occasions.  She indicates that she has a tendency to knock the dorsum of the cyst throughout activities of daily living which causes it to be sore, inflamed, and sometimes bleed.  She is here today for further evaluation and treatment.  Past Medical History:  Diagnosis Date  . Anxiety   . Arthritis    "some in my back; was in my hips" (03/22/2016)  . Asthma   . Chronic lower back pain   . Depression   . Family history of anesthesia complication    "my aunt didn't go out during OR"  . Fibrocystic breast disease   . H/O hiatal hernia   . High cholesterol   . IBS (irritable bowel syndrome)   . Obesity   . PONV (postoperative nausea and vomiting)    bad after last hip replacement  . Sleep apnea    "never RX mask" (03/22/2016)    Past Surgical History:  Procedure Laterality Date  . BACK SURGERY    . BILATERAL TEMPOROMANDIBULAR JOINT ARTHROPLASTY Bilateral 2002  . BREAST CYST ASPIRATION Left 1980's  . COLONOSCOPY W/ POLYPECTOMY  ~ 2007  . DEBRIDEMENT TENNIS ELBOW Right 1990  . DILATION AND CURETTAGE OF UTERUS  1980's X 2  . EYE SURGERY Bilateral 1974   "Pinguecula"  . FINGER SURGERY Right    index cyst removed  . JOINT REPLACEMENT    . LAPAROSCOPIC CHOLECYSTECTOMY  1999  . [place] SURGERY  01/2009  . LUMBAR FUSION  08/2009  . LUMBAR LAMINECTOMY  1981  . TONSILLECTOMY  1983  . TOTAL ABDOMINAL HYSTERECTOMY  1996  . TOTAL HIP ARTHROPLASTY Right 03/18/2014   Procedure: RIGHT TOTAL HIP ARTHROPLASTY ANTERIOR APPROACH;  Surgeon: Hessie Dibble, MD;  Location: [facility];  Service: Orthopedics;  Laterality: Right;  . TOTAL HIP ARTHROPLASTY Left  03/22/2016  . TOTAL HIP ARTHROPLASTY Left 03/22/2016   Procedure: TOTAL HIP ARTHROPLASTY ANTERIOR APPROACH;  Surgeon: Melrose Nakayama, MD;  Location: [facility];  Service: Orthopedics;  Laterality: Left;  . TUBAL LIGATION  1981    Family History  Problem Relation Age of Onset  . Heart disease Mother   . Colon cancer Father    Social History:  reports that she has never smoked. She has never used smokeless tobacco. She reports that she drinks about 6.0 oz of alcohol per week . She reports that she does not use drugs.  Allergies:  Allergies  Allergen Reactions  . Florastor Kids [Saccharomyces Boulardii] Other (See Comments)    SIGNIFICANT CONSTIPATION  . Nucynta [Tapentadol] Other (See Comments)    hallucinations   . Shellfish Allergy Hives and Nausea And Vomiting    SHRIMP LOBSTER  . Simvastatin Other (See Comments)    SEVERE THIGH / FOREARM PAIN  . Tape Other (See Comments)    causes blisters  . Percocet [Oxycodone-Acetaminophen] Nausea And Vomiting  . Codeine Nausea And Vomiting  . Dilaudid [Hydromorphone Hcl] Rash  . Garlic Other (See Comments)    Stomach Upset  . Morphine Sulfate Nausea And Vomiting    No prescriptions prior to admission.    No results found for this  or any previous visit (from the past 48 hour(s)). No results found.  Review of Systems  All other systems reviewed and are negative.   Height 5\' 7"  (1.702 m), weight 72.6 kg (160 lb). Physical Exam  Constitutional:  WD, WN, NAD HEENT:  NCAT, EOMI Neuro/Psych:  Alert & oriented to person, place, and time; appropriate mood & affect Lymphatic: No generalized UE edema or lymphadenopathy Extremities / MSK:  Both UE are normal with respect to appearance, ranges of motion, joint stability, muscle strength/tone, sensation, & perfusion except as otherwise noted:  There is a 2 mm mass on the dorsal portion of the left index finger DIP.  There is a central portion of the mass that appears to have thinner skin.  It is  well formed and round.  The DIP is somewhat painful to fully flex.  Light touch sensibility is intact.  The fingernail does not appear to be affected  Labs / Xrays:  No radiographic studies obtained today.  Assessment: Left dorsal index DIP mucous cyst  Plan:  The findings are discussed at length with the patient.  As she has had it aspirated on 2 separate occasions and it continues to return, the patient wishes to have the cyst surgically excised. The details of the operative procedure were discussed with the patient.  Questions were invited and answered.  In addition to the goal of the procedure, the risks of the procedure to include but not limited to bleeding; infection; damage to the nerves or blood vessels that could result in bleeding, numbness, weakness, chronic pain, and the need for additional procedures; stiffness; the need for revision surgery; and anesthetic risks were reviewed.  No specific outcome was guaranteed or implied.  Informed consent was obtained.   Chelsea Nusz A., MD 04/06/2017, 5:36 PM

## 2017-04-07 DIAGNOSIS — D126 Benign neoplasm of colon, unspecified: Secondary | ICD-10-CM | POA: Diagnosis not present

## 2017-04-10 ENCOUNTER — Ambulatory Visit (HOSPITAL_BASED_OUTPATIENT_CLINIC_OR_DEPARTMENT_OTHER): Payer: PPO | Admitting: Anesthesiology

## 2017-04-10 ENCOUNTER — Encounter (HOSPITAL_BASED_OUTPATIENT_CLINIC_OR_DEPARTMENT_OTHER): Payer: Self-pay | Admitting: *Deleted

## 2017-04-10 ENCOUNTER — Ambulatory Visit (HOSPITAL_BASED_OUTPATIENT_CLINIC_OR_DEPARTMENT_OTHER)
Admission: RE | Admit: 2017-04-10 | Discharge: 2017-04-10 | Disposition: A | Discharge status: Home / Self Care | Payer: PPO | Source: Ambulatory Visit | Attending: Orthopedic Surgery | Admitting: Orthopedic Surgery

## 2017-04-10 ENCOUNTER — Encounter (HOSPITAL_BASED_OUTPATIENT_CLINIC_OR_DEPARTMENT_OTHER)
Admission: RE | Disposition: A | Discharge status: Home / Self Care | Payer: Self-pay | Source: Ambulatory Visit | Attending: Orthopedic Surgery

## 2017-04-10 DIAGNOSIS — G4730 Sleep apnea, unspecified: Secondary | ICD-10-CM | POA: Insufficient documentation

## 2017-04-10 DIAGNOSIS — Z79899 Other long term (current) drug therapy: Secondary | ICD-10-CM | POA: Insufficient documentation

## 2017-04-10 DIAGNOSIS — E669 Obesity, unspecified: Secondary | ICD-10-CM | POA: Insufficient documentation

## 2017-04-10 DIAGNOSIS — Z6826 Body mass index (BMI) 26.0-26.9, adult: Secondary | ICD-10-CM | POA: Diagnosis not present

## 2017-04-10 DIAGNOSIS — Z96643 Presence of artificial hip joint, bilateral: Secondary | ICD-10-CM | POA: Diagnosis not present

## 2017-04-10 DIAGNOSIS — M67422 Ganglion, left elbow: Secondary | ICD-10-CM | POA: Diagnosis not present

## 2017-04-10 DIAGNOSIS — F329 Major depressive disorder, single episode, unspecified: Secondary | ICD-10-CM | POA: Insufficient documentation

## 2017-04-10 DIAGNOSIS — F419 Anxiety disorder, unspecified: Secondary | ICD-10-CM | POA: Diagnosis not present

## 2017-04-10 DIAGNOSIS — E7800 Pure hypercholesterolemia, unspecified: Secondary | ICD-10-CM | POA: Insufficient documentation

## 2017-04-10 DIAGNOSIS — M1612 Unilateral primary osteoarthritis, left hip: Secondary | ICD-10-CM | POA: Diagnosis not present

## 2017-04-10 DIAGNOSIS — M67442 Ganglion, left hand: Secondary | ICD-10-CM | POA: Diagnosis not present

## 2017-04-10 HISTORY — PX: MASS EXCISION: SHX2000

## 2017-04-10 SURGERY — EXCISION MASS
Anesthesia: Monitor Anesthesia Care | Site: Hand | Laterality: Left

## 2017-04-10 MED ORDER — BUPIVACAINE-EPINEPHRINE 0.5% -1:200000 IJ SOLN
INTRAMUSCULAR | Status: DC | PRN
  Administered 2017-04-10: 3 mL

## 2017-04-10 MED ORDER — CEFAZOLIN SODIUM-DEXTROSE 2-4 GM/100ML-% IV SOLN
INTRAVENOUS | Status: AC
  Filled 2017-04-10: qty 100

## 2017-04-10 MED ORDER — PROPOFOL 500 MG/50ML IV EMUL
INTRAVENOUS | Status: DC | PRN
  Administered 2017-04-10: 50 ug/kg/min via INTRAVENOUS

## 2017-04-10 MED ORDER — LIDOCAINE 2% (20 MG/ML) 5 ML SYRINGE
INTRAMUSCULAR | Status: DC | PRN
  Administered 2017-04-10: 60 mg via INTRAVENOUS

## 2017-04-10 MED ORDER — FENTANYL CITRATE (PF) 100 MCG/2ML IJ SOLN
50.0000 ug | INTRAMUSCULAR | Status: DC | PRN
  Administered 2017-04-10: 50 ug via INTRAVENOUS

## 2017-04-10 MED ORDER — LACTATED RINGERS IV SOLN: INTRAVENOUS | Status: DC

## 2017-04-10 MED ORDER — ONDANSETRON HCL 4 MG/2ML IJ SOLN: 4.0000 mg | Freq: Four times a day (QID) | INTRAMUSCULAR | Status: DC | PRN

## 2017-04-10 MED ORDER — LACTATED RINGERS IV SOLN
INTRAVENOUS | Status: DC
  Administered 2017-04-10: 09:00:00 via INTRAVENOUS

## 2017-04-10 MED ORDER — CEFAZOLIN SODIUM-DEXTROSE 2-4 GM/100ML-% IV SOLN
2.0000 g | INTRAVENOUS | Status: AC
  Administered 2017-04-10: 2 g via INTRAVENOUS

## 2017-04-10 MED ORDER — FENTANYL CITRATE (PF) 100 MCG/2ML IJ SOLN
INTRAMUSCULAR | Status: AC
  Filled 2017-04-10: qty 2

## 2017-04-10 MED ORDER — FENTANYL CITRATE (PF) 100 MCG/2ML IJ SOLN: 25.0000 ug | INTRAMUSCULAR | Status: DC | PRN

## 2017-04-10 MED ORDER — ONDANSETRON HCL 4 MG/2ML IJ SOLN
INTRAMUSCULAR | Status: DC | PRN
  Administered 2017-04-10: 4 mg via INTRAVENOUS

## 2017-04-10 MED ORDER — LIDOCAINE HCL 2 % IJ SOLN
INTRAMUSCULAR | Status: DC | PRN
  Administered 2017-04-10: 3 mL

## 2017-04-10 MED ORDER — MIDAZOLAM HCL 2 MG/2ML IJ SOLN: 1.0000 mg | INTRAMUSCULAR | Status: DC | PRN

## 2017-04-10 MED ORDER — SCOPOLAMINE 1 MG/3DAYS TD PT72: 1.0000 | MEDICATED_PATCH | Freq: Once | TRANSDERMAL | Status: DC | PRN

## 2017-04-10 SURGICAL SUPPLY — 42 items
BANDAGE COBAN STERILE 2 (GAUZE/BANDAGES/DRESSINGS) IMPLANT
BLADE MINI RND TIP GREEN BEAV (BLADE) IMPLANT
BLADE SURG 15 STRL LF DISP TIS (BLADE) ×1 IMPLANT
BLADE SURG 15 STRL SS (BLADE) ×1
BNDG COHESIVE 4X5 TAN STRL (GAUZE/BANDAGES/DRESSINGS) ×2 IMPLANT
BNDG ESMARK 4X9 LF (GAUZE/BANDAGES/DRESSINGS) ×2 IMPLANT
BNDG GAUZE 1X2.1 STRL (MISCELLANEOUS) ×2 IMPLANT
BNDG GAUZE ELAST 4 BULKY (GAUZE/BANDAGES/DRESSINGS) ×2 IMPLANT
CHLORAPREP W/TINT 26ML (MISCELLANEOUS) ×2 IMPLANT
CORD BIPOLAR FORCEPS 12FT (ELECTRODE) ×2 IMPLANT
COVER BACK TABLE 60X90IN (DRAPES) ×2 IMPLANT
COVER MAYO STAND STRL (DRAPES) ×2 IMPLANT
CUFF TOURNIQUET SINGLE 18IN (TOURNIQUET CUFF) ×2 IMPLANT
DRAIN PENROSE 1/2X12 LTX STRL (WOUND CARE) IMPLANT
DRAPE EXTREMITY T 121X128X90 (DRAPE) ×2 IMPLANT
DRAPE SURG 17X23 STRL (DRAPES) ×2 IMPLANT
DRSG EMULSION OIL 3X3 NADH (GAUZE/BANDAGES/DRESSINGS) ×2 IMPLANT
GAUZE SPONGE 4X4 12PLY STRL LF (GAUZE/BANDAGES/DRESSINGS) ×2 IMPLANT
GLOVE BIO SURGEON STRL SZ7.5 (GLOVE) ×2 IMPLANT
GLOVE BIOGEL PI IND STRL 7.0 (GLOVE) ×2 IMPLANT
GLOVE BIOGEL PI IND STRL 8 (GLOVE) ×2 IMPLANT
GLOVE BIOGEL PI INDICATOR 7.0 (GLOVE) ×2
GLOVE BIOGEL PI INDICATOR 8 (GLOVE) ×2
GLOVE ECLIPSE 6.5 STRL STRAW (GLOVE) ×4 IMPLANT
GOWN STRL REUS W/ TWL LRG LVL3 (GOWN DISPOSABLE) ×2 IMPLANT
GOWN STRL REUS W/TWL LRG LVL3 (GOWN DISPOSABLE) ×2
GOWN STRL REUS W/TWL XL LVL3 (GOWN DISPOSABLE) ×2 IMPLANT
NEEDLE HYPO 25X1 1.5 SAFETY (NEEDLE) ×2 IMPLANT
NS IRRIG 1000ML POUR BTL (IV SOLUTION) ×2 IMPLANT
PACK BASIN DAY SURGERY FS (CUSTOM PROCEDURE TRAY) ×2 IMPLANT
PADDING CAST ABS 4INX4YD NS (CAST SUPPLIES)
PADDING CAST ABS COTTON 4X4 ST (CAST SUPPLIES) IMPLANT
RUBBERBAND STERILE (MISCELLANEOUS) IMPLANT
STOCKINETTE 6  STRL (DRAPES) ×1
STOCKINETTE 6 STRL (DRAPES) ×1 IMPLANT
SUT VICRYL RAPIDE 4-0 (SUTURE) IMPLANT
SUT VICRYL RAPIDE 4/0 PS 2 (SUTURE) ×2 IMPLANT
SYR 10ML LL (SYRINGE) ×2 IMPLANT
SYR BULB 3OZ (MISCELLANEOUS) ×2 IMPLANT
TOWEL OR 17X24 6PK STRL BLUE (TOWEL DISPOSABLE) ×2 IMPLANT
TOWEL OR NON WOVEN STRL DISP B (DISPOSABLE) ×2 IMPLANT
UNDERPAD 30X30 (UNDERPADS AND DIAPERS) ×2 IMPLANT

## 2017-04-10 NOTE — Discharge Instructions (Addendum)
Discharge Instructions   You have a light dressing on your hand.  You may begin gentle motion of your fingers and hand immediately, but you should not do any heavy lifting or gripping.  Elevate your hand to reduce pain & swelling of the digits.  Ice over the operative site may be helpful to reduce pain & swelling.  DO NOT USE HEAT. Leave the dressing in place until we remove it in the office at your follow-up visit.  Continue to keep it clean and dry over the next 7-10 days. You may drive a car when you are off of prescription pain medications and can safely control your vehicle with both hands. We will address whether therapy will be required or not when you return to the office. You may have already made your follow-up appointment when we completed your preop visit.  If not, please call our office today or the next business day to make your return appointment for 7-10 days after surgery.   Please call [phone] during normal business hours or [phone] after hours for any problems. Including the following:  - excessive redness of the incisions - drainage for more than 4 days - fever of more than 101.5 F  *Please note that pain medications will not be refilled after hours or on weekends.       Post Anesthesia Home Care Instructions  Activity: Get plenty of rest for the remainder of the day. A responsible individual must stay with you for 24 hours following the procedure.  For the next 24 hours, DO NOT: -Drive a car -Paediatric nurse -Drink alcoholic beverages -Take any medication unless instructed by your physician -Make any legal decisions or sign important papers.  Meals: Start with liquid foods such as gelatin or soup. Progress to regular foods as tolerated. Avoid greasy, spicy, heavy foods. If nausea and/or vomiting occur, drink only clear liquids until the nausea and/or vomiting subsides. Call your physician if vomiting continues.  Special  Instructions/Symptoms: Your throat may feel dry or sore from the anesthesia or the breathing tube placed in your throat during surgery. If this causes discomfort, gargle with warm salt water. The discomfort should disappear within 24 hours.  If you had a scopolamine patch placed behind your ear for the management of post- operative nausea and/or vomiting:  1. The medication in the patch is effective for 72 hours, after which it should be removed.  Wrap patch in a tissue and discard in the trash. Wash hands thoroughly with soap and water. 2. You may remove the patch earlier than 72 hours if you experience unpleasant side effects which may include dry mouth, dizziness or visual disturbances. 3. Avoid touching the patch. Wash your hands with soap and water after contact with the patch.

## 2017-04-10 NOTE — Anesthesia Procedure Notes (Signed)
Procedure Name: MAC Date/Time: 04/10/2017 9:49 AM Performed by: Shreyan Hinz D Pre-anesthesia Checklist: Emergency Drugs available, Suction available, Patient being monitored, Patient identified and Timeout performed Patient Re-evaluated:Patient Re-evaluated prior to inductionOxygen Delivery Method: Simple face mask

## 2017-04-10 NOTE — Op Note (Signed)
04/10/2017  8:30 AM  PATIENT:  Kimberly Lowery  71 y.o. female  PRE-OPERATIVE DIAGNOSIS:  L IF ganglion cyst  POST-OPERATIVE DIAGNOSIS:  Same  PROCEDURE:  L IF DIP ganglion cyst excision with transposition flap coverage  SURGEON: Rayvon Char. Grandville Silos, MD  PHYSICIAN ASSISTANT: None  ANESTHESIA:  local and MAC  SPECIMENS:  None  DRAINS:   None  EBL:  less than 50 mL  PREOPERATIVE INDICATIONS:  [name] is a  71 y.o. female with left index finger mucous cyst that has remained symptomatic and has failed to resolve despite nonoperative management.  The risks benefits and alternatives were discussed with the patient preoperatively including but not limited to the risks of infection, bleeding, nerve injury, cardiopulmonary complications, the need for revision surgery, among others, and the patient verbalized understanding and consented to proceed.  OPERATIVE IMPLANTS: None  OPERATIVE PROCEDURE:  After receiving prophylactic antibiotics, the patient was escorted to the operative theatre and placed in a supine position.  A surgical "time-out" was performed during which the planned procedure, proposed operative site, and the correct patient identity were compared to the operative consent and agreement confirmed by the circulating nurse according to current facility policy.  A digital block was performed by me with a mixture of lidocaine and Marcaine bearing epinephrine.  Following application of a tourniquet to the operative extremity, the exposed skin was prepped with Chloraprep and draped in the usual sterile fashion.  The limb was exsanguinated with an Esmarch bandage and the tourniquet inflated to approximately 156mHg higher than systolic BP.  The attenuated skin overlying the cyst and the cyst was excised en bloc creating a triangular defect.  This was fashioned into a transposition flap, ultimately with excision of Burrow's triangle proximally.  Care was taken to protect and preserve the  nail matrix from the eponychial and hyponychial folds.  Extensor tendon was retracted, and a couple small osteophytes debrided from the base of the distal phalanx as well as the capsular tissue.  This was done with a rongeur.  Once satisfied with the excision and debridement, it was irrigated and the tourniquet released.  The flap was rotated and inset with 4-0 Vicryl Rapide interrupted sutures.  A finger dressing was applied and she was taken to the recovery room stable condition.  DISPOSITION: She'll be discharged home today with typical instructions, returning in 7-10 days.

## 2017-04-10 NOTE — Transfer of Care (Signed)
Immediate Anesthesia Transfer of Care Note  Patient: Kimberly Lowery  Procedure(s) Performed: Procedure(s): EXCISION OF LEFT INDEX FINGER CYST (Left)  Patient Location: PACU  Anesthesia Type:MAC  Level of Consciousness: awake, alert , oriented and patient cooperative  Airway & Oxygen Therapy: Patient Spontanous Breathing and Patient connected to face mask oxygen  Post-op Assessment: Report given to RN and Post -op Vital signs reviewed and stable  Post vital signs: Reviewed and stable  Last Vitals:  Vitals:   04/10/17 0854  BP: 119/74  Pulse: 62  Resp: 18  Temp: 36.7 C    Last Pain:  Vitals:   04/10/17 0854  TempSrc: Oral  PainSc: 0-No pain         Complications: No apparent anesthesia complications

## 2017-04-10 NOTE — Anesthesia Postprocedure Evaluation (Signed)
Anesthesia Post Note  Patient: Kimberly Lowery  Procedure(s) Performed: Procedure(s) (LRB): EXCISION OF LEFT INDEX FINGER CYST (Left)     Patient location during evaluation: PACU Anesthesia Type: MAC Level of consciousness: awake and alert Pain management: pain level controlled Vital Signs Assessment: post-procedure vital signs reviewed and stable Respiratory status: spontaneous breathing, nonlabored ventilation, respiratory function stable and patient connected to nasal cannula oxygen Cardiovascular status: stable and blood pressure returned to baseline Anesthetic complications: no    Last Vitals:  Vitals:   04/10/17 1045 04/10/17 1112  BP: 132/74 (!) 146/74  Pulse: (!) 58 62  Resp: 18 20  Temp:  36.9 C    Last Pain:  Vitals:   04/10/17 1112  TempSrc:   PainSc: 0-No pain                 [name]

## 2017-04-10 NOTE — Anesthesia Preprocedure Evaluation (Signed)
Anesthesia Evaluation  Patient identified by MRN, date of birth, ID band Patient awake    Reviewed: Allergy & Precautions, H&P , NPO status , Patient's Chart, lab work & pertinent test results  History of Anesthesia Complications (+) PONV and history of anesthetic complications  Airway Mallampati: II   Neck ROM: full    Dental   Pulmonary asthma , sleep apnea ,    breath sounds clear to auscultation       Cardiovascular negative cardio ROS   Rhythm:regular Rate:Normal     Neuro/Psych PSYCHIATRIC DISORDERS Anxiety Depression    GI/Hepatic hiatal hernia,   Endo/Other    Renal/GU      Musculoskeletal  (+) Arthritis ,   Abdominal   Peds  Hematology   Anesthesia Other Findings   Reproductive/Obstetrics                             Anesthesia Physical Anesthesia Plan  ASA: II  Anesthesia Plan: MAC   Post-op Pain Management:    Induction: Intravenous  PONV Risk Score and Plan: 3 and Ondansetron, Dexamethasone, Propofol and Midazolam  Airway Management Planned: Simple Face Mask  Additional Equipment:   Intra-op Plan:   Post-operative Plan:   Informed Consent: I have reviewed the patients History and Physical, chart, labs and discussed the procedure including the risks, benefits and alternatives for the proposed anesthesia with the patient or authorized representative who has indicated his/her understanding and acceptance.     Plan Discussed with: CRNA, Anesthesiologist and Surgeon  Anesthesia Plan Comments:         Anesthesia Quick Evaluation

## 2017-04-10 NOTE — Interval H&P Note (Signed)
History and Physical Interval Note:  04/10/2017 8:28 AM  Kimberly Lowery  has presented today for surgery, with the diagnosis of MUCCUS CYST OF LEFT INDEX FINGER PIP M67.441  The various methods of treatment have been discussed with the patient and family. After consideration of risks, benefits and other options for treatment, the patient has consented to  Procedure(s): EXCISION OF LEFT INDEX FINGER CYST (Left) as a surgical intervention .  The patient's history has been reviewed, patient examined, no change in status, stable for surgery.  I have reviewed the patient's chart and labs.  Questions were answered to the patient's satisfaction.     Kimberlye Dilger A.

## 2017-04-11 ENCOUNTER — Encounter (HOSPITAL_BASED_OUTPATIENT_CLINIC_OR_DEPARTMENT_OTHER): Payer: Self-pay | Admitting: Orthopedic Surgery

## 2017-06-20 DIAGNOSIS — M67441 Ganglion, right hand: Secondary | ICD-10-CM | POA: Diagnosis not present

## 2017-08-02 DIAGNOSIS — H01001 Unspecified blepharitis right upper eyelid: Secondary | ICD-10-CM | POA: Diagnosis not present

## 2017-08-02 DIAGNOSIS — H5213 Myopia, bilateral: Secondary | ICD-10-CM | POA: Diagnosis not present

## 2017-08-02 DIAGNOSIS — H43813 Vitreous degeneration, bilateral: Secondary | ICD-10-CM | POA: Diagnosis not present

## 2017-08-02 DIAGNOSIS — H02831 Dermatochalasis of right upper eyelid: Secondary | ICD-10-CM | POA: Diagnosis not present

## 2017-08-30 DIAGNOSIS — M79671 Pain in right foot: Secondary | ICD-10-CM | POA: Diagnosis not present

## 2018-01-17 ENCOUNTER — Other Ambulatory Visit: Payer: Self-pay | Admitting: Internal Medicine

## 2018-01-17 DIAGNOSIS — Z86010 Personal history of colonic polyps: Secondary | ICD-10-CM | POA: Diagnosis not present

## 2018-01-17 DIAGNOSIS — Z79899 Other long term (current) drug therapy: Secondary | ICD-10-CM | POA: Diagnosis not present

## 2018-01-17 DIAGNOSIS — I6522 Occlusion and stenosis of left carotid artery: Secondary | ICD-10-CM | POA: Diagnosis not present

## 2018-01-17 DIAGNOSIS — E785 Hyperlipidemia, unspecified: Secondary | ICD-10-CM | POA: Diagnosis not present

## 2018-01-17 DIAGNOSIS — J309 Allergic rhinitis, unspecified: Secondary | ICD-10-CM | POA: Diagnosis not present

## 2018-01-17 DIAGNOSIS — K219 Gastro-esophageal reflux disease without esophagitis: Secondary | ICD-10-CM | POA: Diagnosis not present

## 2018-01-17 DIAGNOSIS — Z1231 Encounter for screening mammogram for malignant neoplasm of breast: Secondary | ICD-10-CM

## 2018-01-17 DIAGNOSIS — Z13820 Encounter for screening for osteoporosis: Secondary | ICD-10-CM | POA: Diagnosis not present

## 2018-01-17 DIAGNOSIS — E559 Vitamin D deficiency, unspecified: Secondary | ICD-10-CM | POA: Diagnosis not present

## 2018-01-17 DIAGNOSIS — Z0000 Encounter for general adult medical examination without abnormal findings: Secondary | ICD-10-CM | POA: Diagnosis not present

## 2018-01-17 DIAGNOSIS — R69 Illness, unspecified: Secondary | ICD-10-CM | POA: Diagnosis not present

## 2018-01-17 DIAGNOSIS — K5730 Diverticulosis of large intestine without perforation or abscess without bleeding: Secondary | ICD-10-CM | POA: Diagnosis not present

## 2018-01-17 DIAGNOSIS — M6289 Other specified disorders of muscle: Secondary | ICD-10-CM | POA: Diagnosis not present

## 2018-01-17 DIAGNOSIS — Z1389 Encounter for screening for other disorder: Secondary | ICD-10-CM | POA: Diagnosis not present

## 2018-02-07 ENCOUNTER — Ambulatory Visit
Admission: RE | Admit: 2018-02-07 | Discharge: 2018-02-07 | Disposition: A | Discharge status: Home / Self Care | Payer: Medicare HMO | Source: Ambulatory Visit | Attending: Internal Medicine | Admitting: Internal Medicine

## 2018-02-07 DIAGNOSIS — R69 Illness, unspecified: Secondary | ICD-10-CM | POA: Diagnosis not present

## 2018-02-07 DIAGNOSIS — Z1231 Encounter for screening mammogram for malignant neoplasm of breast: Secondary | ICD-10-CM

## 2018-02-09 DIAGNOSIS — S76302A Unspecified injury of muscle, fascia and tendon of the posterior muscle group at thigh level, left thigh, initial encounter: Secondary | ICD-10-CM | POA: Diagnosis not present

## 2018-02-20 DIAGNOSIS — D2271 Melanocytic nevi of right lower limb, including hip: Secondary | ICD-10-CM | POA: Diagnosis not present

## 2018-02-20 DIAGNOSIS — Z808 Family history of malignant neoplasm of other organs or systems: Secondary | ICD-10-CM | POA: Diagnosis not present

## 2018-02-20 DIAGNOSIS — L570 Actinic keratosis: Secondary | ICD-10-CM | POA: Diagnosis not present

## 2018-02-20 DIAGNOSIS — Z86018 Personal history of other benign neoplasm: Secondary | ICD-10-CM | POA: Diagnosis not present

## 2018-02-20 DIAGNOSIS — Z85828 Personal history of other malignant neoplasm of skin: Secondary | ICD-10-CM | POA: Diagnosis not present

## 2018-02-20 DIAGNOSIS — D225 Melanocytic nevi of trunk: Secondary | ICD-10-CM | POA: Diagnosis not present

## 2018-02-20 DIAGNOSIS — D2272 Melanocytic nevi of left lower limb, including hip: Secondary | ICD-10-CM | POA: Diagnosis not present

## 2018-02-21 ENCOUNTER — Other Ambulatory Visit: Payer: Self-pay | Admitting: Internal Medicine

## 2018-02-21 ENCOUNTER — Ambulatory Visit
Admission: RE | Admit: 2018-02-21 | Discharge: 2018-02-21 | Disposition: A | Discharge status: Home / Self Care | Payer: Medicare HMO | Source: Ambulatory Visit | Attending: Internal Medicine | Admitting: Internal Medicine

## 2018-02-21 DIAGNOSIS — M7122 Synovial cyst of popliteal space [Baker], left knee: Secondary | ICD-10-CM

## 2018-02-21 DIAGNOSIS — M7120 Synovial cyst of popliteal space [Baker], unspecified knee: Secondary | ICD-10-CM | POA: Diagnosis not present

## 2018-02-21 DIAGNOSIS — M25562 Pain in left knee: Secondary | ICD-10-CM

## 2018-02-27 DIAGNOSIS — M25562 Pain in left knee: Secondary | ICD-10-CM | POA: Diagnosis not present

## 2018-03-26 DIAGNOSIS — M25562 Pain in left knee: Secondary | ICD-10-CM | POA: Diagnosis not present

## 2018-03-31 DIAGNOSIS — M25562 Pain in left knee: Secondary | ICD-10-CM | POA: Diagnosis not present

## 2018-04-18 ENCOUNTER — Other Ambulatory Visit: Payer: Self-pay | Admitting: Orthopaedic Surgery

## 2018-04-27 NOTE — Pre-Procedure Instructions (Signed)
    HADLEY SOILEAU  04/27/2018      [id number] [address], [place] [address] Phone: [phone] Fax: [phone]    Your procedure is scheduled on Tuesday, Aug. 6th   Report to [facility] Admitting at  5:30 AM             (posted surgery time 7:30a - 8:32a)   Call this number if you have problems the morning of surgery:  [phone]   Remember:   Do not eat any foods or drink any liquids after midnight, Monday.              4-5 days prior to surgery, STOP TAKING any Vitamins, Herbal Supplements, Anti-inflammatories, Blood Thinners. (aspirin, motrin, advil, ibuprofen, naproxen, diclofenac    Take these medicines the morning of surgery with A SIP OF WATER : Fluoxetine, Tramadol    Do not wear jewelry, make-up or nail polish.  Do not wear lotions, powders, perfumes, or deodorant.  Do not shave 48 hours prior to surgery.  Do not bring valuables to the hospital.  [facility] is not responsible for any belongings or valuables.  Contacts, dentures or bridgework may not be worn into surgery.  Leave your suitcase in the car.  After surgery it may be brought to your room.  For patients admitted to the hospital, discharge time will be determined by your treatment team.   Patients discharged the day of surgery will not be allowed to drive home. You will definitely need someone to stay with you for the first 24 hrs after.   Please read over the following fact sheets that you were given. Pain Booklet, MRSA Information and Surgical Site Infection Prevention

## 2018-04-30 ENCOUNTER — Encounter (HOSPITAL_COMMUNITY): Payer: Self-pay

## 2018-04-30 ENCOUNTER — Other Ambulatory Visit: Payer: Self-pay

## 2018-04-30 ENCOUNTER — Encounter (HOSPITAL_COMMUNITY)
Admission: RE | Admit: 2018-04-30 | Discharge: 2018-04-30 | Disposition: A | Discharge status: Home / Self Care | Payer: Medicare HMO | Source: Ambulatory Visit | Attending: Orthopaedic Surgery | Admitting: Orthopaedic Surgery

## 2018-04-30 DIAGNOSIS — Z01812 Encounter for preprocedural laboratory examination: Secondary | ICD-10-CM | POA: Insufficient documentation

## 2018-04-30 DIAGNOSIS — Z01810 Encounter for preprocedural cardiovascular examination: Secondary | ICD-10-CM | POA: Insufficient documentation

## 2018-04-30 LAB — COMPREHENSIVE METABOLIC PANEL
ALBUMIN: 3.7 g/dL (ref 3.5–5.0)
ALT: 13 U/L (ref 0–44)
ANION GAP: 6 (ref 5–15)
AST: 16 U/L (ref 15–41)
Alkaline Phosphatase: 58 U/L (ref 38–126)
BUN: 7 mg/dL — AB (ref 8–23)
CALCIUM: 8.8 mg/dL — AB (ref 8.9–10.3)
CO2: 29 mmol/L (ref 22–32)
CREATININE: 0.83 mg/dL (ref 0.44–1.00)
Chloride: 107 mmol/L (ref 98–111)
GFR calc Af Amer: >60 mL/min (ref >60)
GFR calc non Af Amer: >60 mL/min (ref >60)
GLUCOSE: 93 mg/dL (ref 70–99)
Potassium: 3.6 mmol/L (ref 3.5–5.1)
SODIUM: 142 mmol/L (ref 135–145)
Total Bilirubin: 0.7 mg/dL (ref 0.3–1.2)
Total Protein: 6.7 g/dL (ref 6.5–8.1)

## 2018-04-30 LAB — SURGICAL PCR SCREEN
MRSA, PCR: NEGATIVE
Staphylococcus aureus: NEGATIVE

## 2018-04-30 LAB — CBC
HCT: 42.2 % (ref 36.0–46.0)
HEMOGLOBIN: 13.1 g/dL (ref 12.0–15.0)
MCH: 29.6 pg (ref 26.0–34.0)
MCHC: 31 g/dL (ref 30.0–36.0)
MCV: 95.3 fL (ref 78.0–100.0)
Platelets: 257 10*3/uL (ref 150–400)
RBC: 4.43 MIL/uL (ref 3.87–5.11)
RDW: 14.7 % (ref 11.5–15.5)
WBC: 5.5 10*3/uL (ref 4.0–10.5)

## 2018-04-30 NOTE — Progress Notes (Signed)
PCP - Josetta Huddle Cardiologist -denies   Chest x-ray - N/A EKG - 04/30/18 Stress Test - 10+ years ECHO - 10+ years Cardiac Cath - denies  Sleep Study - Over 20 years ago, stated slightly positive results CPAP - denies use  Aspirin Instructions: takes as needed. Will hold prior to surgery  Anesthesia review: No   Patient denies shortness of breath, fever, cough and chest pain at PAT appointment   Patient verbalized understanding of instructions that were given to them at the PAT appointment. Patient was also instructed that they will need to review over the PAT instructions again at home before surgery.

## 2018-04-30 NOTE — Pre-Procedure Instructions (Signed)
Kimberly Lowery  04/30/2018      [id number] [address], [place] [address] Phone: [phone] Fax: [phone]    Your procedure is scheduled on Tuesday, Aug. 6th   Report to [facility] Admitting at  5:30 AM             (posted surgery time 7:30a - 8:32a)   Call this number if you have problems the morning of surgery:  [phone]   Remember:   Do not eat any foods or drink any liquids after midnight, Monday.              4-5 days prior to surgery, STOP TAKING any Vitamins, Herbal Supplements, Anti-inflammatories, Blood Thinners. (aspirin, motrin, advil, ibuprofen, naproxen, diclofenac    Take these medicines the morning of surgery with A SIP OF WATER : Fluoxetine, Tramadol    Do not wear jewelry, make-up or nail polish.  Do not wear lotions, powders, perfumes, or deodorant.  Do not shave 48 hours prior to surgery.  Do not bring valuables to the hospital.  [facility] is not responsible for any belongings or valuables.  Contacts, dentures or bridgework may not be worn into surgery.  Leave your suitcase in the car.  After surgery it may be brought to your room.  For patients admitted to the hospital, discharge time will be determined by your treatment team.   Patients discharged the day of surgery will not be allowed to drive home. You will definitely need someone to stay with you for the first 24 hrs after.   [name]- Preparing For Surgery  Before surgery, you can play an important role. Because skin is not sterile, your skin needs to be as free of germs as possible. You can reduce the number of germs on your skin by washing with CHG (chlorahexidine gluconate) Soap before surgery.  CHG is an antiseptic cleaner which kills germs and bonds with the skin to continue killing germs even after washing.    Oral Hygiene is also important to reduce your risk of infection.  Remember - BRUSH YOUR TEETH  THE MORNING OF SURGERY WITH YOUR REGULAR TOOTHPASTE  Please do not use if you have an allergy to CHG or antibacterial soaps. If your skin becomes reddened/irritated stop using the CHG.  Do not shave (including legs and underarms) for at least 48 hours prior to first CHG shower. It is OK to shave your face.  Please follow these instructions carefully.   1. Shower the NIGHT BEFORE SURGERY and the MORNING OF SURGERY with CHG.   2. If you chose to wash your hair, wash your hair first as usual with your normal shampoo.  3. After you shampoo, rinse your hair and body thoroughly to remove the shampoo.  4. Use CHG as you would any other liquid soap. You can apply CHG directly to the skin and wash gently with a scrungie or a clean washcloth.   5. Apply the CHG Soap to your body ONLY FROM THE NECK DOWN.  Do not use on open wounds or open sores. Avoid contact with your eyes, ears, mouth and genitals (private parts). Wash Face and genitals (private parts)  with your normal soap.  6. Wash thoroughly, paying special attention to the area where your surgery will be performed.  7. Thoroughly rinse your body with warm water from the neck down.  8. DO NOT shower/wash with your normal soap after using  and rinsing off the CHG Soap.  9. Pat yourself dry with a CLEAN TOWEL.  10. Wear CLEAN PAJAMAS to bed the night before surgery, wear comfortable clothes the morning of surgery  11. Place CLEAN SHEETS on your bed the night of your first shower and DO NOT SLEEP WITH PETS.    Day of Surgery:  Do not apply any deodorants/lotions.  Please wear clean clothes to the hospital/surgery center.   Remember to brush your teeth WITH YOUR REGULAR TOOTHPASTE.   Please read over the following fact sheets that you were given. Pain Booklet, MRSA Information and Surgical Site Infection Prevention

## 2018-05-03 NOTE — H&P (Signed)
Kimberly Lowery is an 72 y.o. female.   Chief Complaint: left knee pain  HPI: Left knee is tender along the medial joint line.  She also has greater pain just below the joint line.  Her motion is about 0-110.  She has a trace effusion.  Ligaments feel intact.  Hip motion is full and painfree and SLR is negative on both sides.  There is no palpable LAD behind either knee.  Sensation and motor function are intact on both sides and there are palpable pulses on both sides.   MRI:  I reviewed an MRI scan films and report of a study done at the [facility] on 03/26/18.  This shows a medial meniscus tear along with an insufficiency fracture and subchondral edema along the medial tibial plateau.  The degenerative changes are moderate.  Past Medical History:  Diagnosis Date  . Anxiety   . Arthritis    "some in my back; was in my hips" (03/22/2016)  . Asthma   . Chronic lower back pain   . Depression   . Family history of anesthesia complication    "my aunt didn't go out during OR"  . Fibrocystic breast disease   . H/O hiatal hernia   . High cholesterol   . IBS (irritable bowel syndrome)   . Obesity   . PONV (postoperative nausea and vomiting)    bad after last hip replacement  . Sleep apnea    "never RX mask" (03/22/2016)    Past Surgical History:  Procedure Laterality Date  . BACK SURGERY    . BILATERAL TEMPOROMANDIBULAR JOINT ARTHROPLASTY Bilateral 2002  . BREAST CYST ASPIRATION Left 1980's  . COLONOSCOPY W/ POLYPECTOMY  ~ 2007  . DEBRIDEMENT TENNIS ELBOW Right 1990  . DILATION AND CURETTAGE OF UTERUS  1980's X 2  . EYE SURGERY Bilateral 1974   "Pinguecula"  . FINGER SURGERY Right    index cyst removed  . JOINT REPLACEMENT    . LAPAROSCOPIC CHOLECYSTECTOMY  1999  . [place] SURGERY  01/2009  . LUMBAR FUSION  08/2009  . LUMBAR LAMINECTOMY  1981  . MASS EXCISION Left 04/10/2017   Procedure: EXCISION OF LEFT INDEX FINGER CYST;  Surgeon: Kimberly Jakob, MD;  Location: [facility];  Service: Orthopedics;  Laterality: Left;  . TONSILLECTOMY  1983  . TOTAL ABDOMINAL HYSTERECTOMY  1996  . TOTAL HIP ARTHROPLASTY Right 03/18/2014   Procedure: RIGHT TOTAL HIP ARTHROPLASTY ANTERIOR APPROACH;  Surgeon: Kimberly Dibble, MD;  Location: [facility];  Service: Orthopedics;  Laterality: Right;  . TOTAL HIP ARTHROPLASTY Left 03/22/2016  . TOTAL HIP ARTHROPLASTY Left 03/22/2016   Procedure: TOTAL HIP ARTHROPLASTY ANTERIOR APPROACH;  Surgeon: Kimberly Nakayama, MD;  Location: [facility];  Service: Orthopedics;  Laterality: Left;  . TUBAL LIGATION  1981    Family History  Problem Relation Age of Onset  . Heart disease Mother   . Colon cancer Father    Social History:  reports that she has never smoked. She has never used smokeless tobacco. She reports that she drinks about 6.0 oz of alcohol per week. She reports that she does not use drugs.  Allergies:  Allergies  Allergen Reactions  . Florastor Kids [Saccharomyces Boulardii] Other (See Comments)    SIGNIFICANT CONSTIPATION  . Nucynta [Tapentadol] Other (See Comments)    hallucinations   . Shellfish Allergy Hives and Nausea And Vomiting    SHRIMP LOBSTER  . Simvastatin Other (See Comments)    SEVERE THIGH /  FOREARM PAIN  . Tape Other (See Comments)    causes blisters  . Percocet [Oxycodone-Acetaminophen] Nausea And Vomiting  . Codeine Nausea And Vomiting  . Dilaudid [Hydromorphone Hcl] Rash  . Garlic Other (See Comments)    Stomach Upset  . Morphine Sulfate Nausea And Vomiting    No medications prior to admission.    No results found for this or any previous visit (from the past 48 hour(s)). No results found.  Review of Systems  Musculoskeletal: Positive for joint pain.       Left knee  All other systems reviewed and are negative.   There were no vitals taken for this visit. Physical Exam  Constitutional: She is oriented to person, place, and time. She appears well-developed and well-nourished.  HENT:   Head: Normocephalic and atraumatic.  Eyes: Pupils are equal, round, and reactive to light.  Neck: Normal range of motion.  Cardiovascular: Normal rate.  Respiratory: Effort normal.  GI: Soft.  Musculoskeletal:  Left knee is tender along the medial joint line.  She also has greater pain just below the joint line.  Her motion is about 0-110.  She has a trace effusion.  Ligaments feel intact.  Hip motion is full and painfree and SLR is negative on both sides.  There is no palpable LAD behind either knee.  Sensation and motor function are intact on both sides and there are palpable pulses on both sides.   Neurological: She is alert and oriented to person, place, and time.  Skin: Skin is warm and dry.  Psychiatric: She has a normal mood and affect. Her behavior is normal. Judgment and thought content normal.     Assessment/Plan Assessment:  Left knee torn medial meniscus and stress fracture by MRI 2019 injected 02/27/18  Plan: I think Kimberly Lowery is probably headed towards a left knee arthroscopy.  She has been symptomatic for several months despite activity restriction, medications, and injection.  She also has a stress injury along the medial tibial plateau and I believe we can help her with a sub-chondroplasty.  I reviewed risk of anesthesia and infection and DVT. She is in agreement and would like to proceed.  Kimberly Lowery, Kimberly Sachs, PA-C 05/03/2018, 10:59 AM

## 2018-05-07 MED ORDER — LACTATED RINGERS IV SOLN
INTRAVENOUS | Status: DC
  Administered 2018-05-08: 07:00:00 via INTRAVENOUS

## 2018-05-07 MED ORDER — CEFAZOLIN SODIUM-DEXTROSE 2-4 GM/100ML-% IV SOLN
2.0000 g | INTRAVENOUS | Status: AC
  Administered 2018-05-08: 2 g via INTRAVENOUS
  Filled 2018-05-07: qty 100

## 2018-05-07 NOTE — Anesthesia Preprocedure Evaluation (Signed)
Anesthesia Evaluation  Patient identified by MRN, date of birth, ID band Patient awake    Reviewed: Allergy & Precautions, H&P , NPO status , Patient's Chart, lab work & pertinent test results  History of Anesthesia Complications (+) PONV, Family history of anesthesia reaction and history of anesthetic complications  Airway Mallampati: II   Neck ROM: full    Dental   Pulmonary asthma , sleep apnea ,    breath sounds clear to auscultation       Cardiovascular negative cardio ROS   Rhythm:regular Rate:Normal     Neuro/Psych PSYCHIATRIC DISORDERS Anxiety Depression    GI/Hepatic hiatal hernia,   Endo/Other    Renal/GU      Musculoskeletal  (+) Arthritis ,   Abdominal   Peds  Hematology   Anesthesia Other Findings   Reproductive/Obstetrics                             Anesthesia Physical  Anesthesia Plan  ASA: II  Anesthesia Plan: General   Post-op Pain Management:    Induction: Intravenous  PONV Risk Score and Plan: 3 and Ondansetron, Dexamethasone, Midazolam and Treatment may vary due to age or medical condition  Airway Management Planned: LMA  Additional Equipment:   Intra-op Plan:   Post-operative Plan: Extubation in OR  Informed Consent: I have reviewed the patients History and Physical, chart, labs and discussed the procedure including the risks, benefits and alternatives for the proposed anesthesia with the patient or authorized representative who has indicated his/her understanding and acceptance.   Dental advisory given  Plan Discussed with: CRNA  Anesthesia Plan Comments:         Anesthesia Quick Evaluation

## 2018-05-08 ENCOUNTER — Encounter (HOSPITAL_COMMUNITY): Payer: Self-pay | Admitting: *Deleted

## 2018-05-08 ENCOUNTER — Ambulatory Visit (HOSPITAL_COMMUNITY): Payer: Medicare HMO | Admitting: Anesthesiology

## 2018-05-08 ENCOUNTER — Encounter (HOSPITAL_COMMUNITY)
Admission: RE | Disposition: A | Discharge status: Home / Self Care | Payer: Self-pay | Source: Ambulatory Visit | Attending: Orthopaedic Surgery

## 2018-05-08 ENCOUNTER — Other Ambulatory Visit: Payer: Self-pay

## 2018-05-08 ENCOUNTER — Ambulatory Visit (HOSPITAL_COMMUNITY)
Admission: RE | Admit: 2018-05-08 | Discharge: 2018-05-08 | Disposition: A | Discharge status: Home / Self Care | Payer: Medicare HMO | Source: Ambulatory Visit | Attending: Orthopaedic Surgery | Admitting: Orthopaedic Surgery

## 2018-05-08 DIAGNOSIS — Z90710 Acquired absence of both cervix and uterus: Secondary | ICD-10-CM | POA: Insufficient documentation

## 2018-05-08 DIAGNOSIS — Z888 Allergy status to other drugs, medicaments and biological substances status: Secondary | ICD-10-CM | POA: Insufficient documentation

## 2018-05-08 DIAGNOSIS — N6019 Diffuse cystic mastopathy of unspecified breast: Secondary | ICD-10-CM | POA: Diagnosis not present

## 2018-05-08 DIAGNOSIS — M160 Bilateral primary osteoarthritis of hip: Secondary | ICD-10-CM | POA: Diagnosis not present

## 2018-05-08 DIAGNOSIS — R69 Illness, unspecified: Secondary | ICD-10-CM | POA: Diagnosis not present

## 2018-05-08 DIAGNOSIS — G8929 Other chronic pain: Secondary | ICD-10-CM | POA: Insufficient documentation

## 2018-05-08 DIAGNOSIS — K449 Diaphragmatic hernia without obstruction or gangrene: Secondary | ICD-10-CM | POA: Diagnosis not present

## 2018-05-08 DIAGNOSIS — Z981 Arthrodesis status: Secondary | ICD-10-CM | POA: Diagnosis not present

## 2018-05-08 DIAGNOSIS — E669 Obesity, unspecified: Secondary | ICD-10-CM | POA: Diagnosis not present

## 2018-05-08 DIAGNOSIS — Y939 Activity, unspecified: Secondary | ICD-10-CM | POA: Insufficient documentation

## 2018-05-08 DIAGNOSIS — F419 Anxiety disorder, unspecified: Secondary | ICD-10-CM | POA: Diagnosis not present

## 2018-05-08 DIAGNOSIS — G4730 Sleep apnea, unspecified: Secondary | ICD-10-CM | POA: Diagnosis not present

## 2018-05-08 DIAGNOSIS — Z9049 Acquired absence of other specified parts of digestive tract: Secondary | ICD-10-CM | POA: Insufficient documentation

## 2018-05-08 DIAGNOSIS — X58XXXA Exposure to other specified factors, initial encounter: Secondary | ICD-10-CM | POA: Diagnosis not present

## 2018-05-08 DIAGNOSIS — Z800 Family history of malignant neoplasm of digestive organs: Secondary | ICD-10-CM | POA: Diagnosis not present

## 2018-05-08 DIAGNOSIS — Z9851 Tubal ligation status: Secondary | ICD-10-CM | POA: Insufficient documentation

## 2018-05-08 DIAGNOSIS — Z91013 Allergy to seafood: Secondary | ICD-10-CM | POA: Insufficient documentation

## 2018-05-08 DIAGNOSIS — S82145A Nondisplaced bicondylar fracture of left tibia, initial encounter for closed fracture: Secondary | ICD-10-CM | POA: Diagnosis not present

## 2018-05-08 DIAGNOSIS — E7800 Pure hypercholesterolemia, unspecified: Secondary | ICD-10-CM | POA: Insufficient documentation

## 2018-05-08 DIAGNOSIS — Z885 Allergy status to narcotic agent status: Secondary | ICD-10-CM | POA: Diagnosis not present

## 2018-05-08 DIAGNOSIS — F329 Major depressive disorder, single episode, unspecified: Secondary | ICD-10-CM | POA: Insufficient documentation

## 2018-05-08 DIAGNOSIS — S83242A Other tear of medial meniscus, current injury, left knee, initial encounter: Secondary | ICD-10-CM | POA: Insufficient documentation

## 2018-05-08 DIAGNOSIS — M4690 Unspecified inflammatory spondylopathy, site unspecified: Secondary | ICD-10-CM | POA: Insufficient documentation

## 2018-05-08 DIAGNOSIS — Z86010 Personal history of colonic polyps: Secondary | ICD-10-CM | POA: Diagnosis not present

## 2018-05-08 DIAGNOSIS — Z8249 Family history of ischemic heart disease and other diseases of the circulatory system: Secondary | ICD-10-CM | POA: Insufficient documentation

## 2018-05-08 DIAGNOSIS — J45909 Unspecified asthma, uncomplicated: Secondary | ICD-10-CM | POA: Insufficient documentation

## 2018-05-08 DIAGNOSIS — M1990 Unspecified osteoarthritis, unspecified site: Secondary | ICD-10-CM | POA: Insufficient documentation

## 2018-05-08 DIAGNOSIS — M23322 Other meniscus derangements, posterior horn of medial meniscus, left knee: Secondary | ICD-10-CM | POA: Diagnosis not present

## 2018-05-08 DIAGNOSIS — S82135A Nondisplaced fracture of medial condyle of left tibia, initial encounter for closed fracture: Secondary | ICD-10-CM | POA: Diagnosis not present

## 2018-05-08 DIAGNOSIS — M545 Low back pain: Secondary | ICD-10-CM | POA: Insufficient documentation

## 2018-05-08 DIAGNOSIS — S82102A Unspecified fracture of upper end of left tibia, initial encounter for closed fracture: Secondary | ICD-10-CM | POA: Diagnosis not present

## 2018-05-08 DIAGNOSIS — Z91048 Other nonmedicinal substance allergy status: Secondary | ICD-10-CM | POA: Insufficient documentation

## 2018-05-08 DIAGNOSIS — Z96642 Presence of left artificial hip joint: Secondary | ICD-10-CM | POA: Insufficient documentation

## 2018-05-08 DIAGNOSIS — K589 Irritable bowel syndrome without diarrhea: Secondary | ICD-10-CM | POA: Diagnosis not present

## 2018-05-08 HISTORY — PX: KNEE ARTHROSCOPY WITH SUBCHONDROPLASTY: SHX6732

## 2018-05-08 SURGERY — ARTHROSCOPY, KNEE, WITH SUBCHONDROPLASTY
Anesthesia: General | Laterality: Left

## 2018-05-08 MED ORDER — SODIUM CHLORIDE 0.9 % IR SOLN
Status: DC | PRN
  Administered 2018-05-08 (×2): 3000 mL

## 2018-05-08 MED ORDER — FENTANYL CITRATE (PF) 100 MCG/2ML IJ SOLN
25.0000 ug | INTRAMUSCULAR | Status: DC | PRN
Start: 2018-05-08 — End: 2018-05-08
  Administered 2018-05-08: 25 ug via INTRAVENOUS
  Administered 2018-05-08: 50 ug via INTRAVENOUS
  Administered 2018-05-08: 25 ug via INTRAVENOUS

## 2018-05-08 MED ORDER — FENTANYL CITRATE (PF) 250 MCG/5ML IJ SOLN
INTRAMUSCULAR | Status: AC
  Filled 2018-05-08: qty 5

## 2018-05-08 MED ORDER — CHLORHEXIDINE GLUCONATE 4 % EX LIQD: 60.0000 mL | Freq: Once | CUTANEOUS | Status: DC

## 2018-05-08 MED ORDER — MIDAZOLAM HCL 5 MG/5ML IJ SOLN
INTRAMUSCULAR | Status: DC | PRN
  Administered 2018-05-08: 2 mg via INTRAVENOUS

## 2018-05-08 MED ORDER — PROMETHAZINE HCL 12.5 MG PO TABS: 12.5000 mg | ORAL_TABLET | Freq: Four times a day (QID) | ORAL | 0 refills | Status: DC | PRN

## 2018-05-08 MED ORDER — DEXAMETHASONE SODIUM PHOSPHATE 10 MG/ML IJ SOLN
INTRAMUSCULAR | Status: DC | PRN
  Administered 2018-05-08: 10 mg via INTRAVENOUS

## 2018-05-08 MED ORDER — LIDOCAINE 2% (20 MG/ML) 5 ML SYRINGE
INTRAMUSCULAR | Status: AC
  Filled 2018-05-08: qty 5

## 2018-05-08 MED ORDER — PROPOFOL 10 MG/ML IV BOLUS
INTRAVENOUS | Status: AC
  Filled 2018-05-08: qty 20

## 2018-05-08 MED ORDER — FENTANYL CITRATE (PF) 100 MCG/2ML IJ SOLN
INTRAMUSCULAR | Status: AC
  Filled 2018-05-08: qty 2

## 2018-05-08 MED ORDER — PHENYLEPHRINE 40 MCG/ML (10ML) SYRINGE FOR IV PUSH (FOR BLOOD PRESSURE SUPPORT)
PREFILLED_SYRINGE | INTRAVENOUS | Status: AC
  Filled 2018-05-08: qty 10

## 2018-05-08 MED ORDER — PROPOFOL 500 MG/50ML IV EMUL
INTRAVENOUS | Status: DC | PRN
  Administered 2018-05-08: 50 ug/kg/min via INTRAVENOUS

## 2018-05-08 MED ORDER — MEPERIDINE HCL 50 MG/ML IJ SOLN: 6.2500 mg | INTRAMUSCULAR | Status: DC | PRN

## 2018-05-08 MED ORDER — BUPIVACAINE-EPINEPHRINE 0.5% -1:200000 IJ SOLN
INTRAMUSCULAR | Status: DC | PRN
  Administered 2018-05-08: 30 mL

## 2018-05-08 MED ORDER — ONDANSETRON HCL 4 MG/2ML IJ SOLN
INTRAMUSCULAR | Status: AC
  Filled 2018-05-08: qty 4

## 2018-05-08 MED ORDER — EPHEDRINE 5 MG/ML INJ
INTRAVENOUS | Status: AC
  Filled 2018-05-08: qty 10

## 2018-05-08 MED ORDER — LIDOCAINE 2% (20 MG/ML) 5 ML SYRINGE
INTRAMUSCULAR | Status: DC | PRN
  Administered 2018-05-08: 100 mg via INTRAVENOUS
  Administered 2018-05-08: 80 mg via INTRAVENOUS

## 2018-05-08 MED ORDER — PROMETHAZINE HCL 25 MG/ML IJ SOLN: 6.2500 mg | INTRAMUSCULAR | Status: DC | PRN

## 2018-05-08 MED ORDER — MIDAZOLAM HCL 2 MG/2ML IJ SOLN
INTRAMUSCULAR | Status: AC
  Filled 2018-05-08: qty 2

## 2018-05-08 MED ORDER — PHENYLEPHRINE 40 MCG/ML (10ML) SYRINGE FOR IV PUSH (FOR BLOOD PRESSURE SUPPORT)
PREFILLED_SYRINGE | INTRAVENOUS | Status: DC | PRN
  Administered 2018-05-08: 40 ug via INTRAVENOUS
  Administered 2018-05-08: 80 ug via INTRAVENOUS

## 2018-05-08 MED ORDER — DEXAMETHASONE SODIUM PHOSPHATE 10 MG/ML IJ SOLN
INTRAMUSCULAR | Status: AC
  Filled 2018-05-08: qty 1

## 2018-05-08 MED ORDER — BUPIVACAINE-EPINEPHRINE (PF) 0.5% -1:200000 IJ SOLN
INTRAMUSCULAR | Status: AC
  Filled 2018-05-08: qty 30

## 2018-05-08 MED ORDER — HYDROCODONE-ACETAMINOPHEN 5-325 MG PO TABS: 1.0000 | ORAL_TABLET | Freq: Four times a day (QID) | ORAL | 0 refills | Status: DC | PRN

## 2018-05-08 MED ORDER — ONDANSETRON HCL 4 MG/2ML IJ SOLN
INTRAMUSCULAR | Status: DC | PRN
  Administered 2018-05-08 (×2): 4 mg via INTRAVENOUS

## 2018-05-08 MED ORDER — EPHEDRINE SULFATE-NACL 50-0.9 MG/10ML-% IV SOSY
PREFILLED_SYRINGE | INTRAVENOUS | Status: DC | PRN
  Administered 2018-05-08 (×2): 5 mg via INTRAVENOUS

## 2018-05-08 MED ORDER — FENTANYL CITRATE (PF) 100 MCG/2ML IJ SOLN
INTRAMUSCULAR | Status: DC | PRN
  Administered 2018-05-08: 50 ug via INTRAVENOUS
  Administered 2018-05-08: 25 ug via INTRAVENOUS

## 2018-05-08 MED ORDER — PROPOFOL 10 MG/ML IV BOLUS
INTRAVENOUS | Status: DC | PRN
  Administered 2018-05-08 (×2): 180 mg via INTRAVENOUS

## 2018-05-08 SURGICAL SUPPLY — 38 items
BANDAGE ACE 6X5 VEL STRL LF (GAUZE/BANDAGES/DRESSINGS) ×2 IMPLANT
BLADE GREAT WHITE 4.2 (BLADE) ×2 IMPLANT
BLADE SURG 11 STRL SS (BLADE) IMPLANT
BNDG GAUZE ELAST 4 BULKY (GAUZE/BANDAGES/DRESSINGS) ×2 IMPLANT
CANNULA ACCUPORT 11G X 120 (CANNULA) ×2 IMPLANT
COVER SURGICAL LIGHT HANDLE (MISCELLANEOUS) ×2 IMPLANT
CUFF TOURNIQUET SINGLE 34IN LL (TOURNIQUET CUFF) IMPLANT
CUFF TOURNIQUET SINGLE 44IN (TOURNIQUET CUFF) IMPLANT
DRAPE ARTHROSCOPY W/POUCH 114 (DRAPES) ×2 IMPLANT
DRAPE U-SHAPE 47X51 STRL (DRAPES) ×2 IMPLANT
DRSG EMULSION OIL 3X3 NADH (GAUZE/BANDAGES/DRESSINGS) ×2 IMPLANT
DRSG PAD ABDOMINAL 8X10 ST (GAUZE/BANDAGES/DRESSINGS) ×2 IMPLANT
DURAPREP 26ML APPLICATOR (WOUND CARE) ×2 IMPLANT
GAUZE SPONGE 4X4 12PLY STRL (GAUZE/BANDAGES/DRESSINGS) ×2 IMPLANT
GAUZE SPONGE 4X4 16PLY XRAY LF (GAUZE/BANDAGES/DRESSINGS) ×2 IMPLANT
GLOVE BIO SURGEON STRL SZ8 (GLOVE) ×8 IMPLANT
GLOVE BIOGEL PI IND STRL 8 (GLOVE) ×1 IMPLANT
GLOVE BIOGEL PI INDICATOR 8 (GLOVE) ×1
GOWN STRL REUS W/ TWL LRG LVL3 (GOWN DISPOSABLE) ×3 IMPLANT
GOWN STRL REUS W/ TWL XL LVL3 (GOWN DISPOSABLE) ×3 IMPLANT
GOWN STRL REUS W/TWL 2XL LVL3 (GOWN DISPOSABLE) ×2 IMPLANT
GOWN STRL REUS W/TWL LRG LVL3 (GOWN DISPOSABLE) ×3
GOWN STRL REUS W/TWL XL LVL3 (GOWN DISPOSABLE) ×3
KIT MIXER ACCUMIX (KITS) ×2 IMPLANT
KIT TURNOVER KIT B (KITS) ×2 IMPLANT
KNEE KIT SCP W/SIDE ACCUPORT (Joint) ×2 IMPLANT
MANIFOLD NEPTUNE II (INSTRUMENTS) ×2 IMPLANT
NEEDLE 18GX1X1/2 (RX/OR ONLY) (NEEDLE) ×2 IMPLANT
NEEDLE 22X1 1/2 (OR ONLY) (NEEDLE) IMPLANT
NEEDLE SPNL 18GX3.5 QUINCKE PK (NEEDLE) IMPLANT
PACK ARTHROSCOPY DSU (CUSTOM PROCEDURE TRAY) ×2 IMPLANT
PAD ABD 8X10 STRL (GAUZE/BANDAGES/DRESSINGS) ×2 IMPLANT
PAD ARMBOARD 7.5X6 YLW CONV (MISCELLANEOUS) ×4 IMPLANT
SET ARTHROSCOPY TUBING (MISCELLANEOUS) ×1
SET ARTHROSCOPY TUBING LN (MISCELLANEOUS) ×1 IMPLANT
SYR CONTROL 10ML LL (SYRINGE) ×2 IMPLANT
TOWEL OR 17X24 6PK STRL BLUE (TOWEL DISPOSABLE) ×4 IMPLANT
WATER STERILE IRR 1000ML POUR (IV SOLUTION) ×2 IMPLANT

## 2018-05-08 NOTE — Op Note (Signed)
PRE-OP DIAGNOSIS:  1. Nondisplaced medial tibial plateau fracture left knee 2. Torn medial meniscus left knee POST-OP DIAGNOSIS:  same  PROCEDURE:  Left knee arthroscopy with partial medial menisectomy and left knee percutaneous arthroscopic assisted fixation medial tibial plateau fracture SURGEON:  Melrose Nakayama MD ASSISTANT: Kimberly Dolly PA ANESTHESIA:  General  INDICATION FOR PROCEDURE:  Kimberly Lowery is a 72 y.o. female with a painful knee.  The patient has failed non-operative measures and has a knee that does not allow for participation in desired activities.  The patient is offered arthroscopy to address TMM seen on MRI and also subchondroplasty to stabilize stress fracture of medial tibial plateau.  Associated conditions are to be addressed as well.  Informed operative consent was obtained after discussion of risks including reaction to anesthesia, infection, DVT, and stiffness.  The importance of the post-operative rehabilitation protocol to optimize result was stressed extensively with the patient.  SUMMARY OF FINDINGS AND PROCEDURE:  Kimberly Lowery was taken to the operative suite where under the above anesthesia a knee arthroscopy was performed. The suprapatellar pouch was benign while the patellofemoral joint showed moderate grade 3 articular cartilage damage.  The medial compartment was notable for grade 3 and focal grade 4 articular cartilage damage and posterior horn TMM meniscal pathology. The ACL and PCL were intact. The lateral compartment was notable for minimal articular cartilage damage and no meniscal pathology.  The meniscal and articular cartilage problems were addressed with partial medial meniscectomy using shaver and basket. Once this was done we performed the subchondroplasty percutaneous fixation of the medial plateau fracture with the Zimmer kit under fluoroscopic guidance.   Kimberly Dolly PA assisted throughout and was invaluable to the completion of the case in that he  positioned and retracted.  The patient was scheduled to go home same day.    DESCRIPTION OF PROCEDURE:  Kimberly Lowery was taken to the operative suite where the above anesthetic was applied.  The patient was positioned supine and prepped and draped in normal sterile fashion.  An appropriate time out was performed.  After the administration of kefzol pre-operative antibiotic and arthroscopy of the knee was performed. Findings were as noted above and appropriate articular and meniscal cartilage work was done.  I performed about a 5% PMM and a chondroplasty both medial and patellofemoral. Once this was completed I used fluoro guidance to place the Subchondroplasty trochar into the medial tibial plateau through a small medial incision.  The AccuMix system was utilized and the calcium phosphate mixture was prepared and injected appropriately.  This was well visualized on fluoro. Once the mixture hardened the trochar was removed.  Adaptic was applied along with a sterile dressing.  Estimated blood loss and intraoperative fluids can be obtained from anesthesia records.  DISPOSITION:  The patient was extubated in the operating room and taken to recovery room in stable condition.  Plans were to go home same day depending on condition in recovery.  I will contact her tonight.  Jazleen Robeck G 05/08/2018, 4:36 PM

## 2018-05-08 NOTE — Brief Op Note (Signed)
Kimberly Lowery [id number] 05/08/2018   PRE-OP DIAGNOSIS: left knee TMM and stress injury  POST-OP DIAGNOSIS: same  PROCEDURE: left knee scope and subchondroplasty  ANESTHESIA: general  Rigel Filsinger G   Dictation #:  pending

## 2018-05-08 NOTE — Progress Notes (Signed)
Orthopedic Tech Progress Note Patient Details:  Kimberly Lowery 10/15/1945 [id number]  Ortho Devices Type of Ortho Device: Crutches Ortho Device/Splint Interventions: Adjustment   Post Interventions Instructions Provided: Care of device   [name] 05/08/2018, 9:42 AM

## 2018-05-08 NOTE — Progress Notes (Signed)
Pt stated did not want to take crutches home, she has a walker and a cane to use at home.  RN discontinued Crutches order and put clean crutches in [facility] room.

## 2018-05-08 NOTE — Anesthesia Procedure Notes (Signed)
Procedure Name: LMA Insertion Date/Time: 05/08/2018 7:51 AM Performed by: Gwyndolyn Saxon, CRNA Pre-anesthesia Checklist: Patient identified, Emergency Drugs available, Suction available, Patient being monitored and Timeout performed Patient Re-evaluated:Patient Re-evaluated prior to induction Oxygen Delivery Method: Circle system utilized Preoxygenation: Pre-oxygenation with 100% oxygen Induction Type: IV induction Ventilation: Mask ventilation without difficulty LMA: LMA inserted LMA Size: 4.0 Number of attempts: 1 Placement Confirmation: positive ETCO2,  CO2 detector and breath sounds checked- equal and bilateral Tube secured with: Tape Dental Injury: Teeth and Oropharynx as per pre-operative assessment

## 2018-05-08 NOTE — Anesthesia Postprocedure Evaluation (Signed)
Anesthesia Post Note  Patient: Kimberly Lowery  Procedure(s) Performed: KNEE ARTHROSCOPY WITH SUBCHONDROPLASTY (Left )     Patient location during evaluation: PACU Anesthesia Type: General Level of consciousness: sedated and patient cooperative Pain management: pain level controlled Vital Signs Assessment: post-procedure vital signs reviewed and stable Respiratory status: spontaneous breathing Cardiovascular status: stable Anesthetic complications: no    Last Vitals:  Vitals:   05/08/18 1009 05/08/18 1024  BP: (!) 151/84 (!) 147/91  Pulse: 64 65  Resp: 17 11  Temp:    SpO2: 100% 100%    Last Pain:  Vitals:   05/08/18 1024  TempSrc:   PainSc: [place]

## 2018-05-08 NOTE — Transfer of Care (Signed)
Immediate Anesthesia Transfer of Care Note  Patient: Kimberly Lowery  Procedure(s) Performed: KNEE ARTHROSCOPY WITH SUBCHONDROPLASTY (Left )  Patient Location: PACU  Anesthesia Type:General  Level of Consciousness: drowsy  Airway & Oxygen Therapy: Patient Spontanous Breathing and Patient connected to face mask oxygen  Post-op Assessment: Report given to RN and Post -op Vital signs reviewed and stable  Post vital signs: Reviewed and stable  Last Vitals:  Vitals Value Taken Time  BP 155/75 05/08/2018  9:09 AM  Temp    Pulse 74 05/08/2018  9:12 AM  Resp 10 05/08/2018  9:12 AM  SpO2 99 % 05/08/2018  9:12 AM  Vitals shown include unvalidated device data.  Last Pain:  Vitals:   05/08/18 0633  TempSrc:   PainSc: 7          Complications: No apparent anesthesia complications

## 2018-05-08 NOTE — Interval H&P Note (Signed)
History and Physical Interval Note:  05/08/2018 7:18 AM  Kimberly Lowery  has presented today for surgery, with the diagnosis of [place]  The various methods of treatment have been discussed with the patient and family. After consideration of risks, benefits and other options for treatment, the patient has consented to  Procedure(s): KNEE ARTHROSCOPY WITH SUBCHONDROPLASTY (Left) as a surgical intervention .  The patient's history has been reviewed, patient examined, no change in status, stable for surgery.  I have reviewed the patient's chart and labs.  Questions were answered to the patient's satisfaction.     Ilse Billman G

## 2018-05-08 NOTE — Discharge Instructions (Signed)

## 2018-05-09 ENCOUNTER — Encounter (HOSPITAL_COMMUNITY): Payer: Self-pay | Admitting: Orthopaedic Surgery

## 2018-05-15 DIAGNOSIS — Z9889 Other specified postprocedural states: Secondary | ICD-10-CM | POA: Diagnosis not present

## 2018-06-01 DIAGNOSIS — Z9889 Other specified postprocedural states: Secondary | ICD-10-CM | POA: Diagnosis not present

## 2018-06-12 DIAGNOSIS — M25562 Pain in left knee: Secondary | ICD-10-CM | POA: Diagnosis not present

## 2018-06-12 DIAGNOSIS — M7061 Trochanteric bursitis, right hip: Secondary | ICD-10-CM | POA: Diagnosis not present

## 2018-06-12 DIAGNOSIS — M7062 Trochanteric bursitis, left hip: Secondary | ICD-10-CM | POA: Diagnosis not present

## 2018-06-12 DIAGNOSIS — R2689 Other abnormalities of gait and mobility: Secondary | ICD-10-CM | POA: Diagnosis not present

## 2018-06-12 DIAGNOSIS — R69 Illness, unspecified: Secondary | ICD-10-CM | POA: Diagnosis not present

## 2018-06-16 DIAGNOSIS — M7062 Trochanteric bursitis, left hip: Secondary | ICD-10-CM | POA: Diagnosis not present

## 2018-06-16 DIAGNOSIS — R2689 Other abnormalities of gait and mobility: Secondary | ICD-10-CM | POA: Diagnosis not present

## 2018-06-16 DIAGNOSIS — M7061 Trochanteric bursitis, right hip: Secondary | ICD-10-CM | POA: Diagnosis not present

## 2018-06-16 DIAGNOSIS — M25562 Pain in left knee: Secondary | ICD-10-CM | POA: Diagnosis not present

## 2018-06-17 IMAGING — RF DG HIP (WITH PELVIS) OPERATIVE*L*
1 series · 2 of 2 positions shown · non-contrast
Comparison: None.

CLINICAL DATA: Left hip replacement.  Intraoperative imaging.

EXAM:
OPERATIVE LEFT HIP (WITH PELVIS IF PERFORMED) 2 VIEWS
TECHNIQUE: Fluoroscopic spot image(s) were submitted for interpretation
post-operatively.

[Series 1: run · 2 of 2 slices shown]
[im 1/2]
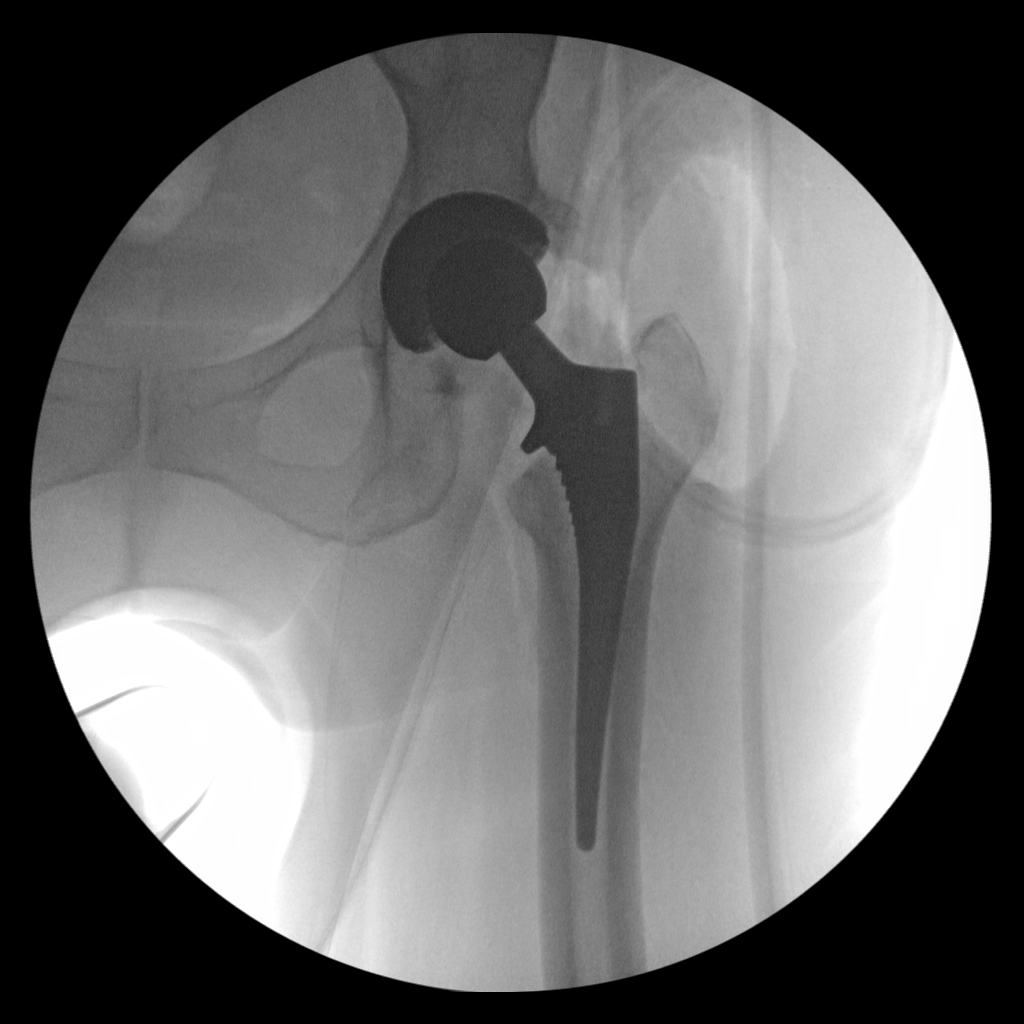
[im 2/2]
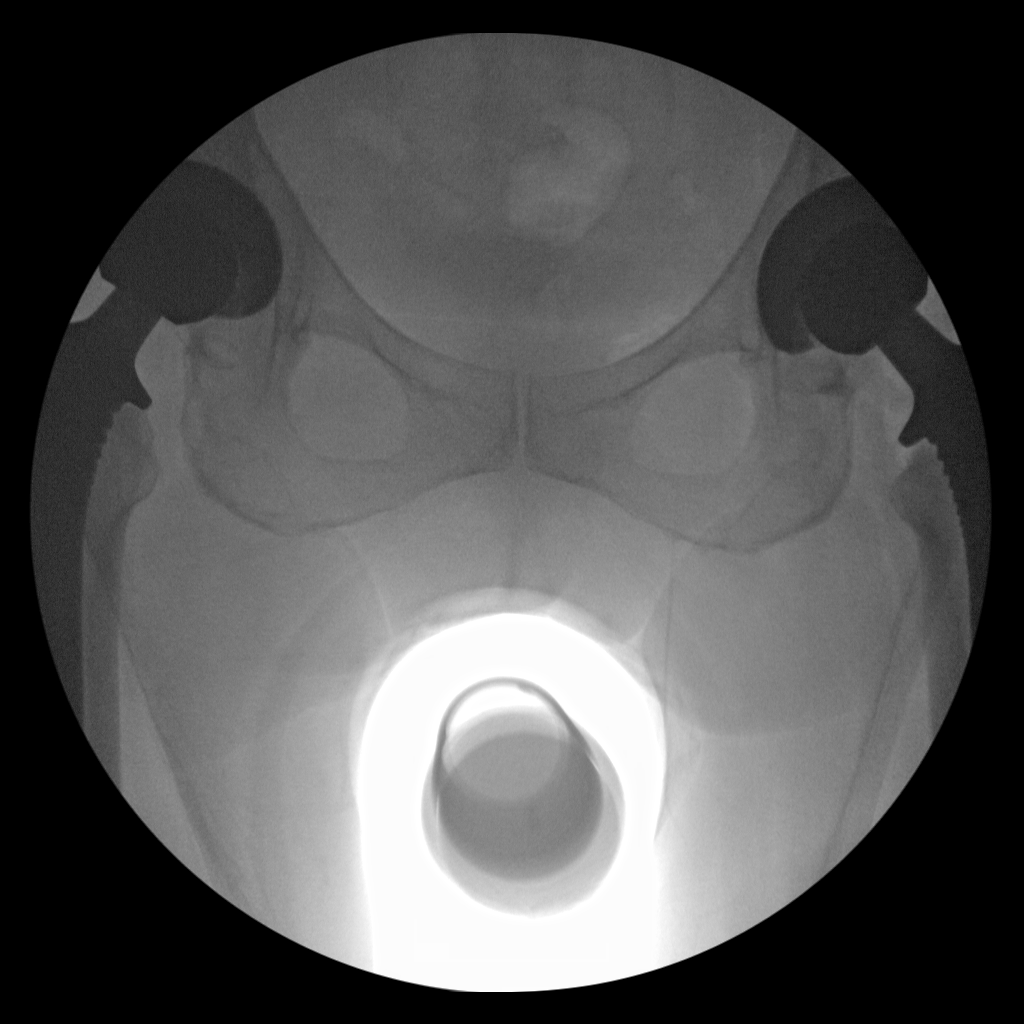

[2 of 2 positions shown; findings below may reference images not displayed]

FINDINGS: Imaging demonstrates a left total hip arthroplasty in place. The
device is located. No fracture is identified.
IMPRESSION: Left hip replacement.  No acute abnormality.

## 2018-06-18 DIAGNOSIS — M7061 Trochanteric bursitis, right hip: Secondary | ICD-10-CM | POA: Diagnosis not present

## 2018-06-18 DIAGNOSIS — M25562 Pain in left knee: Secondary | ICD-10-CM | POA: Diagnosis not present

## 2018-06-18 DIAGNOSIS — R2689 Other abnormalities of gait and mobility: Secondary | ICD-10-CM | POA: Diagnosis not present

## 2018-06-18 DIAGNOSIS — M7062 Trochanteric bursitis, left hip: Secondary | ICD-10-CM | POA: Diagnosis not present

## 2018-06-20 DIAGNOSIS — M7061 Trochanteric bursitis, right hip: Secondary | ICD-10-CM | POA: Diagnosis not present

## 2018-06-20 DIAGNOSIS — M7062 Trochanteric bursitis, left hip: Secondary | ICD-10-CM | POA: Diagnosis not present

## 2018-06-20 DIAGNOSIS — M25562 Pain in left knee: Secondary | ICD-10-CM | POA: Diagnosis not present

## 2018-06-20 DIAGNOSIS — R2689 Other abnormalities of gait and mobility: Secondary | ICD-10-CM | POA: Diagnosis not present

## 2018-06-22 DIAGNOSIS — M25562 Pain in left knee: Secondary | ICD-10-CM | POA: Diagnosis not present

## 2018-06-22 DIAGNOSIS — R2689 Other abnormalities of gait and mobility: Secondary | ICD-10-CM | POA: Diagnosis not present

## 2018-06-22 DIAGNOSIS — M7062 Trochanteric bursitis, left hip: Secondary | ICD-10-CM | POA: Diagnosis not present

## 2018-06-22 DIAGNOSIS — M7061 Trochanteric bursitis, right hip: Secondary | ICD-10-CM | POA: Diagnosis not present

## 2018-06-25 DIAGNOSIS — M25562 Pain in left knee: Secondary | ICD-10-CM | POA: Diagnosis not present

## 2018-06-25 DIAGNOSIS — M7061 Trochanteric bursitis, right hip: Secondary | ICD-10-CM | POA: Diagnosis not present

## 2018-06-25 DIAGNOSIS — M7062 Trochanteric bursitis, left hip: Secondary | ICD-10-CM | POA: Diagnosis not present

## 2018-06-25 DIAGNOSIS — R2689 Other abnormalities of gait and mobility: Secondary | ICD-10-CM | POA: Diagnosis not present

## 2018-06-27 DIAGNOSIS — M7062 Trochanteric bursitis, left hip: Secondary | ICD-10-CM | POA: Diagnosis not present

## 2018-06-27 DIAGNOSIS — R2689 Other abnormalities of gait and mobility: Secondary | ICD-10-CM | POA: Diagnosis not present

## 2018-06-27 DIAGNOSIS — M7061 Trochanteric bursitis, right hip: Secondary | ICD-10-CM | POA: Diagnosis not present

## 2018-06-27 DIAGNOSIS — M25562 Pain in left knee: Secondary | ICD-10-CM | POA: Diagnosis not present

## 2018-06-29 DIAGNOSIS — M25562 Pain in left knee: Secondary | ICD-10-CM | POA: Diagnosis not present

## 2018-07-02 DIAGNOSIS — M25562 Pain in left knee: Secondary | ICD-10-CM | POA: Diagnosis not present

## 2018-07-02 DIAGNOSIS — R2689 Other abnormalities of gait and mobility: Secondary | ICD-10-CM | POA: Diagnosis not present

## 2018-07-02 DIAGNOSIS — M7061 Trochanteric bursitis, right hip: Secondary | ICD-10-CM | POA: Diagnosis not present

## 2018-07-02 DIAGNOSIS — M7062 Trochanteric bursitis, left hip: Secondary | ICD-10-CM | POA: Diagnosis not present

## 2018-07-06 DIAGNOSIS — M25562 Pain in left knee: Secondary | ICD-10-CM | POA: Diagnosis not present

## 2018-07-06 DIAGNOSIS — R2689 Other abnormalities of gait and mobility: Secondary | ICD-10-CM | POA: Diagnosis not present

## 2018-07-06 DIAGNOSIS — M7062 Trochanteric bursitis, left hip: Secondary | ICD-10-CM | POA: Diagnosis not present

## 2018-07-06 DIAGNOSIS — M7061 Trochanteric bursitis, right hip: Secondary | ICD-10-CM | POA: Diagnosis not present

## 2018-07-10 DIAGNOSIS — M7061 Trochanteric bursitis, right hip: Secondary | ICD-10-CM | POA: Diagnosis not present

## 2018-07-10 DIAGNOSIS — M25562 Pain in left knee: Secondary | ICD-10-CM | POA: Diagnosis not present

## 2018-07-10 DIAGNOSIS — R2689 Other abnormalities of gait and mobility: Secondary | ICD-10-CM | POA: Diagnosis not present

## 2018-07-10 DIAGNOSIS — M7062 Trochanteric bursitis, left hip: Secondary | ICD-10-CM | POA: Diagnosis not present

## 2018-07-18 DIAGNOSIS — M7062 Trochanteric bursitis, left hip: Secondary | ICD-10-CM | POA: Diagnosis not present

## 2018-07-18 DIAGNOSIS — M7061 Trochanteric bursitis, right hip: Secondary | ICD-10-CM | POA: Diagnosis not present

## 2018-07-18 DIAGNOSIS — M25562 Pain in left knee: Secondary | ICD-10-CM | POA: Diagnosis not present

## 2018-07-18 DIAGNOSIS — R2689 Other abnormalities of gait and mobility: Secondary | ICD-10-CM | POA: Diagnosis not present

## 2018-07-20 DIAGNOSIS — R2689 Other abnormalities of gait and mobility: Secondary | ICD-10-CM | POA: Diagnosis not present

## 2018-07-20 DIAGNOSIS — M25562 Pain in left knee: Secondary | ICD-10-CM | POA: Diagnosis not present

## 2018-07-20 DIAGNOSIS — M7062 Trochanteric bursitis, left hip: Secondary | ICD-10-CM | POA: Diagnosis not present

## 2018-07-20 DIAGNOSIS — M7061 Trochanteric bursitis, right hip: Secondary | ICD-10-CM | POA: Diagnosis not present

## 2018-07-25 DIAGNOSIS — M7062 Trochanteric bursitis, left hip: Secondary | ICD-10-CM | POA: Diagnosis not present

## 2018-07-25 DIAGNOSIS — M7061 Trochanteric bursitis, right hip: Secondary | ICD-10-CM | POA: Diagnosis not present

## 2018-07-25 DIAGNOSIS — M25562 Pain in left knee: Secondary | ICD-10-CM | POA: Diagnosis not present

## 2018-07-25 DIAGNOSIS — R2689 Other abnormalities of gait and mobility: Secondary | ICD-10-CM | POA: Diagnosis not present

## 2018-07-27 DIAGNOSIS — Z9889 Other specified postprocedural states: Secondary | ICD-10-CM | POA: Diagnosis not present

## 2018-08-06 DIAGNOSIS — H02889 Meibomian gland dysfunction of unspecified eye, unspecified eyelid: Secondary | ICD-10-CM | POA: Diagnosis not present

## 2018-08-06 DIAGNOSIS — H2513 Age-related nuclear cataract, bilateral: Secondary | ICD-10-CM | POA: Diagnosis not present

## 2018-08-15 DIAGNOSIS — Z0100 Encounter for examination of eyes and vision without abnormal findings: Secondary | ICD-10-CM | POA: Diagnosis not present

## 2018-08-16 DIAGNOSIS — R69 Illness, unspecified: Secondary | ICD-10-CM | POA: Diagnosis not present

## 2018-08-24 DIAGNOSIS — M25562 Pain in left knee: Secondary | ICD-10-CM | POA: Diagnosis not present

## 2018-09-07 DIAGNOSIS — M25562 Pain in left knee: Secondary | ICD-10-CM | POA: Diagnosis not present

## 2018-10-26 ENCOUNTER — Other Ambulatory Visit: Payer: Self-pay | Admitting: Orthopaedic Surgery

## 2018-11-02 ENCOUNTER — Encounter (HOSPITAL_COMMUNITY)
Admission: RE | Admit: 2018-11-02 | Discharge: 2018-11-02 | Disposition: A | Discharge status: Home / Self Care | Payer: Medicare HMO | Source: Ambulatory Visit | Attending: Orthopaedic Surgery | Admitting: Orthopaedic Surgery

## 2018-11-02 ENCOUNTER — Ambulatory Visit (HOSPITAL_COMMUNITY)
Admission: RE | Admit: 2018-11-02 | Discharge: 2018-11-02 | Disposition: A | Discharge status: Home / Self Care | Payer: Medicare HMO | Source: Ambulatory Visit | Attending: Orthopaedic Surgery | Admitting: Orthopaedic Surgery

## 2018-11-02 ENCOUNTER — Encounter (HOSPITAL_COMMUNITY): Payer: Self-pay

## 2018-11-02 ENCOUNTER — Other Ambulatory Visit: Payer: Self-pay

## 2018-11-02 DIAGNOSIS — Z96652 Presence of left artificial knee joint: Secondary | ICD-10-CM | POA: Diagnosis not present

## 2018-11-02 DIAGNOSIS — Z01818 Encounter for other preprocedural examination: Secondary | ICD-10-CM | POA: Diagnosis present

## 2018-11-02 DIAGNOSIS — Z471 Aftercare following joint replacement surgery: Secondary | ICD-10-CM | POA: Diagnosis not present

## 2018-11-02 LAB — BASIC METABOLIC PANEL
Anion gap: 9 (ref 5–15)
BUN: 12 mg/dL (ref 8–23)
CHLORIDE: 106 mmol/L (ref 98–111)
CO2: 27 mmol/L (ref 22–32)
Calcium: 9.3 mg/dL (ref 8.9–10.3)
Creatinine, Ser: 0.84 mg/dL (ref 0.44–1.00)
GFR calc Af Amer: >60 mL/min (ref >60)
GFR calc non Af Amer: >60 mL/min (ref >60)
GLUCOSE: 110 mg/dL — AB (ref 70–99)
Potassium: 4.2 mmol/L (ref 3.5–5.1)
Sodium: 142 mmol/L (ref 135–145)

## 2018-11-02 LAB — URINALYSIS, ROUTINE W REFLEX MICROSCOPIC
Bacteria, UA: NONE SEEN
Bilirubin Urine: NEGATIVE
Glucose, UA: NEGATIVE mg/dL
Ketones, ur: NEGATIVE mg/dL
Nitrite: NEGATIVE
Protein, ur: NEGATIVE mg/dL
Specific Gravity, Urine: 1.015 (ref 1.005–1.030)
pH: 5 (ref 5.0–8.0)

## 2018-11-02 LAB — CBC WITH DIFFERENTIAL/PLATELET
Abs Immature Granulocytes: 0.02 10*3/uL (ref 0.00–0.07)
Basophils Absolute: 0 10*3/uL (ref 0.0–0.1)
Basophils Relative: 0 %
Eosinophils Absolute: 0.2 10*3/uL (ref 0.0–0.5)
Eosinophils Relative: 2 %
HEMATOCRIT: 42.7 % (ref 36.0–46.0)
HEMOGLOBIN: 13.4 g/dL (ref 12.0–15.0)
Immature Granulocytes: 0 %
LYMPHS PCT: 25 %
Lymphs Abs: 2 10*3/uL (ref 0.7–4.0)
MCH: 29.8 pg (ref 26.0–34.0)
MCHC: 31.4 g/dL (ref 30.0–36.0)
MCV: 94.9 fL (ref 80.0–100.0)
Monocytes Absolute: 0.6 10*3/uL (ref 0.1–1.0)
Monocytes Relative: 7 %
NEUTROS ABS: 5.1 10*3/uL (ref 1.7–7.7)
Neutrophils Relative %: 66 %
Platelets: 278 10*3/uL (ref 150–400)
RBC: 4.5 MIL/uL (ref 3.87–5.11)
RDW: 13.7 % (ref 11.5–15.5)
WBC: 7.9 10*3/uL (ref 4.0–10.5)
nRBC: 0 % (ref 0.0–0.2)

## 2018-11-02 LAB — PROTIME-INR
INR: 0.99
Prothrombin Time: 13 seconds (ref 11.4–15.2)

## 2018-11-02 LAB — TYPE AND SCREEN
ABO/RH(D): B POS
Antibody Screen: NEGATIVE

## 2018-11-02 LAB — APTT: aPTT: 28 seconds (ref 24–36)

## 2018-11-02 LAB — SURGICAL PCR SCREEN
MRSA, PCR: NEGATIVE
Staphylococcus aureus: NEGATIVE

## 2018-11-02 NOTE — Progress Notes (Addendum)
PCP - Josetta Huddle, MD Cardiologist - pt denies  Chest x-ray - 11/02/2018 in [facility] EKG - 04/30/2018  Stress Test -  Pt denies ECHO - pt denies  Cardiac Cath - pt denies  Sleep Study - Yes , mild per patient CPAP - no  Fasting Blood Sugar - n/a Checks Blood Sugar _____ times a day-n/a  Blood Thinner Instructions: n/a Aspirin Instructions: Aspirin 81 mg- holding 7 days prior to procedure  Anesthesia review: pending labs, CXR  Patient denies shortness of breath, fever, cough and chest pain at PAT appointment  Patient verbalized understanding of instructions that were given to them at the PAT appointment. Patient was also instructed that they will need to review over the PAT instructions again at home before surgery.

## 2018-11-02 NOTE — Pre-Procedure Instructions (Addendum)
Kimberly Lowery  11/02/2018      [id number] [address], [place] [address] Phone: [phone] Fax: [phone]    Your procedure is scheduled on November 13, 2018.  Report to [facility] Admitting at 800 AM.  Call this number if you have problems the morning of surgery:  [phone]   Remember:  Do not eat or drink after midnight.    Take these medicines the morning of surgery with A SIP OF WATER  Doxycycline (Periostat) Fluoxetine (prozac) Tramadol (Ultram)-if needed for pain  Follow your surgeon's instructions on when to hold/resume aspirin.  If no instructions were given call the office to determine how they would like to you take aspirin  7 days prior to surgery STOP taking any diclofenac (voltaren), Aspirin (unless otherwise instructed by your surgeon), Aleve, Naproxen, Ibuprofen, Motrin, Advil, Goody's, BC's, all herbal medications, fish oil, and all vitamins   Do not wear jewelry, make-up or nail polish.  Do not wear lotions, powders, or perfumes, or deodorant.  Do not shave 48 hours prior to surgery.    Do not bring valuables to the hospital.  [facility] is not responsible for any belongings or valuables.  Contacts, dentures or bridgework may not be worn into surgery.  Leave your suitcase in the car.  After surgery it may be brought to your room.  For patients admitted to the hospital, discharge time will be determined by your treatment team.  Patients discharged the day of surgery will not be allowed to drive home.    [facility]- Preparing For Surgery  Before surgery, you can play an important role. Because skin is not sterile, your skin needs to be as free of germs as possible. You can reduce the number of germs on your skin by washing with CHG (chlorahexidine gluconate) Soap before surgery.  CHG is an antiseptic cleaner which kills germs and bonds with the skin to continue killing germs  even after washing.    Oral Hygiene is also important to reduce your risk of infection.  Remember - BRUSH YOUR TEETH THE MORNING OF SURGERY WITH YOUR REGULAR TOOTHPASTE  Please do not use if you have an allergy to CHG or antibacterial soaps. If your skin becomes reddened/irritated stop using the CHG.  Do not shave (including legs and underarms) for at least 48 hours prior to first CHG shower. It is OK to shave your face.  Please follow these instructions carefully.   1. Shower the NIGHT BEFORE SURGERY and the MORNING OF SURGERY with CHG.   2. If you chose to wash your hair, wash your hair first as usual with your normal shampoo.  3. After you shampoo, rinse your hair and body thoroughly to remove the shampoo.  4. Use CHG as you would any other liquid soap. You can apply CHG directly to the skin and wash gently with a scrungie or a clean washcloth.   5. Apply the CHG Soap to your body ONLY FROM THE NECK DOWN.  Do not use on open wounds or open sores. Avoid contact with your eyes, ears, mouth and genitals (private parts). Wash Face and genitals (private parts)  with your normal soap.  6. Wash thoroughly, paying special attention to the area where your surgery will be performed.  7. Thoroughly rinse your body with warm water from the neck down.  8. DO NOT shower/wash with your normal soap after using and rinsing off the  CHG Soap.  9. Pat yourself dry with a CLEAN TOWEL.  10. Wear CLEAN PAJAMAS to bed the night before surgery, wear comfortable clothes the morning of surgery  11. Place CLEAN SHEETS on your bed the night of your first shower and DO NOT SLEEP WITH PETS.  Day of Surgery:  Do not apply any deodorants/lotions.  Please wear clean clothes to the hospital/surgery center.   Remember to brush your teeth WITH YOUR REGULAR TOOTHPASTE.  Please read over the following fact sheets that you were given.

## 2018-11-08 NOTE — H&P (Signed)
TOTAL KNEE ADMISSION H&P  Patient is being admitted for left total knee arthroplasty.  Subjective:  Chief Complaint:left knee pain.  HPI: Kimberly Lowery, 73 y.o. female, has a history of pain and functional disability in the left knee due to arthritis and has failed non-surgical conservative treatments for greater than 12 weeks to includeNSAID's and/or analgesics, corticosteriod injections, flexibility and strengthening excercises, supervised PT with diminished ADL's post treatment, use of assistive devices, weight reduction as appropriate and activity modification.  Onset of symptoms was gradual, starting 5 years ago with gradually worsening course since that time. The patient noted prior procedures on the knee to include  arthroscopy on the left knee(s).  Patient currently rates pain in the left knee(s) at 10 out of 10 with activity. Patient has night pain, worsening of pain with activity and weight bearing, pain that interferes with activities of daily living, crepitus and joint swelling.  Patient has evidence of subchondral cysts, subchondral sclerosis, periarticular osteophytes and joint space narrowing by imaging studies. There is no active infection.  Patient Active Problem List   Diagnosis Date Noted  . Primary osteoarthritis of left hip 03/22/2016  . Degenerative joint disease (DJD) of hip 03/18/2014   Past Medical History:  Diagnosis Date  . Anxiety   . Arthritis    "some in my back; was in my hips" (03/22/2016)  . Asthma   . Chronic lower back pain   . Depression   . Family history of anesthesia complication    "my aunt didn't go out during OR"  . Fibrocystic breast disease   . H/O hiatal hernia   . High cholesterol   . IBS (irritable bowel syndrome)   . Obesity   . PONV (postoperative nausea and vomiting)    bad after last hip replacement  . Sleep apnea    "never RX mask" (03/22/2016)    Past Surgical History:  Procedure Laterality Date  . BACK SURGERY    . BILATERAL  TEMPOROMANDIBULAR JOINT ARTHROPLASTY Bilateral 2002  . BREAST CYST ASPIRATION Left 1980's  . COLONOSCOPY W/ POLYPECTOMY  ~ 2007  . DEBRIDEMENT TENNIS ELBOW Right 1990  . DILATION AND CURETTAGE OF UTERUS  1980's X 2  . EYE SURGERY Bilateral 1974   "Pinguecula"  . FINGER SURGERY Right    index cyst removed  . JOINT REPLACEMENT    . KNEE ARTHROSCOPY WITH SUBCHONDROPLASTY Left 05/08/2018   Procedure: KNEE ARTHROSCOPY WITH SUBCHONDROPLASTY;  Surgeon: Melrose Nakayama, MD;  Location: [facility] [facility];  Service: Orthopedics;  Laterality: Left;  . LAPAROSCOPIC CHOLECYSTECTOMY  1999  . [place] SURGERY  01/2009  . LUMBAR FUSION  08/2009  . LUMBAR LAMINECTOMY  1981  . MASS EXCISION Left 04/10/2017   Procedure: EXCISION OF LEFT INDEX FINGER CYST;  Surgeon: Milly Jakob, MD;  Location: [facility];  Service: Orthopedics;  Laterality: Left;  . TONSILLECTOMY  1983  . TOTAL ABDOMINAL HYSTERECTOMY  1996  . TOTAL HIP ARTHROPLASTY Right 03/18/2014   Procedure: RIGHT TOTAL HIP ARTHROPLASTY ANTERIOR APPROACH;  Surgeon: Hessie Dibble, MD;  Location: [facility];  Service: Orthopedics;  Laterality: Right;  . TOTAL HIP ARTHROPLASTY Left 03/22/2016  . TOTAL HIP ARTHROPLASTY Left 03/22/2016   Procedure: TOTAL HIP ARTHROPLASTY ANTERIOR APPROACH;  Surgeon: Melrose Nakayama, MD;  Location: [facility];  Service: Orthopedics;  Laterality: Left;  . TUBAL LIGATION  1981    No current facility-administered medications for this encounter.    Current Outpatient Medications  Medication Sig Dispense Refill Last Dose  .  amoxicillin (AMOXIL) 500 MG tablet Take 2,000 mg by mouth See admin instructions. Before dental procedures   More than a month at Unknown time  . aspirin EC 81 MG tablet Take 81 mg by mouth daily.   Past Week at Unknown time  . Biotin 1000 MCG CHEW Chew 1 each by mouth daily.   Past Week at Unknown time  . Cholecalciferol (VITAMIN D3) 5000 units TABS Take 5,000 Units by mouth daily.    Past Week at Unknown time   . clindamycin (CLEOCIN T) 1 % lotion Apply 1 application topically every morning.   Past Week at Unknown time  . desonide (DESOWEN) 0.05 % ointment Apply 1 application topically daily as needed (rash on neck).   More than a month at Unknown time  . diclofenac (VOLTAREN) 75 MG EC tablet Take 75 mg by mouth 2 (two) times daily.    Past Week at Unknown time  . diclofenac sodium (VOLTAREN) 1 % GEL Apply 1 application topically 2 (two) times daily as needed.   More than a month at Unknown time  . doxycycline (PERIOSTAT) 20 MG tablet Take 20 mg by mouth 2 (two) times daily.   Past Week at Unknown time  . FLUoxetine (PROZAC) 20 MG capsule Take 20 mg by mouth daily.   05/07/2018 at Unknown time  . rosuvastatin (CRESTOR) 10 MG tablet Take 10 mg by mouth daily.    05/07/2018 at Unknown time  . traMADol (ULTRAM) 50 MG tablet Take 50 mg by mouth every 6 (six) hours as needed for severe pain.   Past Week at Unknown time  . vitamin B-12 (CYANOCOBALAMIN) 1000 MCG tablet Take 1,000 mcg by mouth daily.   Past Week at Unknown time   Allergies  Allergen Reactions  . Shellfish Allergy Hives and Nausea And Vomiting    SHRIMP LOBSTER  . Tape Other (See Comments)    causes blisters  . Codeine Nausea And Vomiting  . Dilaudid [Hydromorphone Hcl] Rash  . Florastor Kids [Saccharomyces Boulardii] Other (See Comments)    "SIGNIFICANT CONSTIPATION"  . Garlic Other (See Comments)    Stomach Upset  . Morphine Sulfate Nausea And Vomiting  . Nucynta [Tapentadol] Other (See Comments)    hallucinations   . Percocet [Oxycodone-Acetaminophen] Nausea And Vomiting    Social History   Tobacco Use  . Smoking status: Never Smoker  . Smokeless tobacco: Never Used  Substance Use Topics  . Alcohol use: Yes    Comment: socially    Family History  Problem Relation Age of Onset  . Heart disease Mother   . Colon cancer Father      Review of Systems  Musculoskeletal: Positive for joint pain.       Left knee   All other  systems reviewed and are negative.   Objective:  Physical Exam  Constitutional: She is oriented to person, place, and time. She appears well-developed and well-nourished.  HENT:  Head: Normocephalic and atraumatic.  Eyes: Pupils are equal, round, and reactive to light.  Neck: Normal range of motion.  Cardiovascular: Normal rate and regular rhythm.  Respiratory: Effort normal.  GI: Soft.  Musculoskeletal:     Comments: Left knee motion is about 5-120.  She has medial joint line pain and crepitation.  I do not feel an effusion.  Her scope portals are healed up and benign.  Hip motion is full and straight leg raise is negative.  Sensation and motor function are intact distally and she has palpable pulses  in her feet.    Neurological: She is alert and oriented to person, place, and time.  Skin: Skin is warm and dry.  Psychiatric: She has a normal mood and affect. Her behavior is normal. Judgment and thought content normal.    Vital signs in last 24 hours:    Labs:   Estimated body mass index is 29.57 kg/m as calculated from the following:   Height as of 11/02/18: 5\' 6"  (1.676 m).   Weight as of 11/02/18: 83.1 kg.   Imaging Review Plain radiographs demonstrate severe degenerative joint disease of the left knee(s). The overall alignment isneutral. The bone quality appears to be good for age and reported activity level.      Assessment/Plan:  End stage primary arthritis, left knee   The patient history, physical examination, clinical judgment of the provider and imaging studies are consistent with end stage degenerative joint disease of the left knee(s) and total knee arthroplasty is deemed medically necessary. The treatment options including medical management, injection therapy arthroscopy and arthroplasty were discussed at length. The risks and benefits of total knee arthroplasty were presented and reviewed. The risks due to aseptic loosening, infection, stiffness, patella  tracking problems, thromboembolic complications and other imponderables were discussed. The patient acknowledged the explanation, agreed to proceed with the plan and consent was signed. Patient is being admitted for inpatient treatment for surgery, pain control, PT, OT, prophylactic antibiotics, VTE prophylaxis, progressive ambulation and ADL's and discharge planning. The patient is planning to be discharged home with home health services   Patient's anticipated LOS is less than 2 midnights, meeting these requirements: - Younger than 60 - Lives within 1 hour of care - Has a competent adult at home to recover with post-op recover - NO history of  - Chronic pain requiring opiods  - Diabetes  - Coronary Artery Disease  - Heart failure  - Heart attack  - Stroke  - DVT/VTE  - Cardiac arrhythmia  - Respiratory Failure/COPD  - Renal failure  - Anemia  - Advanced Liver disease

## 2018-11-09 ENCOUNTER — Other Ambulatory Visit: Payer: Self-pay | Admitting: Orthopaedic Surgery

## 2018-11-09 NOTE — Care Plan (Signed)
Spoke with patient prior to surgery. Will discharge to home with family.  Has all equipment at home. She is aware and agreeable. Choice offered.  Ladell Heads, [place]

## 2018-11-12 ENCOUNTER — Other Ambulatory Visit: Payer: Self-pay | Admitting: Orthopaedic Surgery

## 2018-11-13 ENCOUNTER — Encounter (HOSPITAL_COMMUNITY): Payer: Self-pay | Admitting: Anesthesiology

## 2018-11-13 ENCOUNTER — Encounter (HOSPITAL_COMMUNITY)
Admission: RE | Disposition: A | Discharge status: Home / Self Care | Payer: Self-pay | Source: Home / Self Care | Attending: Orthopaedic Surgery

## 2018-11-13 ENCOUNTER — Other Ambulatory Visit: Payer: Self-pay

## 2018-11-13 ENCOUNTER — Ambulatory Visit (HOSPITAL_COMMUNITY): Payer: Medicare HMO | Admitting: Anesthesiology

## 2018-11-13 ENCOUNTER — Observation Stay (HOSPITAL_COMMUNITY)
Admission: RE | Admit: 2018-11-13 | Discharge: 2018-11-14 | Disposition: A | Discharge status: Home / Self Care | Payer: Medicare HMO | Attending: Orthopaedic Surgery | Admitting: Orthopaedic Surgery

## 2018-11-13 DIAGNOSIS — Z79899 Other long term (current) drug therapy: Secondary | ICD-10-CM | POA: Insufficient documentation

## 2018-11-13 DIAGNOSIS — Z7982 Long term (current) use of aspirin: Secondary | ICD-10-CM | POA: Insufficient documentation

## 2018-11-13 DIAGNOSIS — E785 Hyperlipidemia, unspecified: Secondary | ICD-10-CM | POA: Diagnosis not present

## 2018-11-13 DIAGNOSIS — E7800 Pure hypercholesterolemia, unspecified: Secondary | ICD-10-CM | POA: Insufficient documentation

## 2018-11-13 DIAGNOSIS — F329 Major depressive disorder, single episode, unspecified: Secondary | ICD-10-CM | POA: Diagnosis not present

## 2018-11-13 DIAGNOSIS — M1712 Unilateral primary osteoarthritis, left knee: Secondary | ICD-10-CM | POA: Diagnosis not present

## 2018-11-13 DIAGNOSIS — R69 Illness, unspecified: Secondary | ICD-10-CM | POA: Diagnosis not present

## 2018-11-13 DIAGNOSIS — F419 Anxiety disorder, unspecified: Secondary | ICD-10-CM | POA: Insufficient documentation

## 2018-11-13 DIAGNOSIS — G4730 Sleep apnea, unspecified: Secondary | ICD-10-CM | POA: Insufficient documentation

## 2018-11-13 DIAGNOSIS — Z791 Long term (current) use of non-steroidal anti-inflammatories (NSAID): Secondary | ICD-10-CM | POA: Insufficient documentation

## 2018-11-13 DIAGNOSIS — Z96642 Presence of left artificial hip joint: Secondary | ICD-10-CM | POA: Insufficient documentation

## 2018-11-13 DIAGNOSIS — G8918 Other acute postprocedural pain: Secondary | ICD-10-CM | POA: Diagnosis not present

## 2018-11-13 DIAGNOSIS — M25562 Pain in left knee: Secondary | ICD-10-CM | POA: Diagnosis present

## 2018-11-13 HISTORY — PX: TOTAL KNEE ARTHROPLASTY: SHX125

## 2018-11-13 SURGERY — ARTHROPLASTY, KNEE, TOTAL
Anesthesia: Spinal | Laterality: Left

## 2018-11-13 MED ORDER — ACETAMINOPHEN 325 MG PO TABS: 325.0000 mg | ORAL_TABLET | Freq: Four times a day (QID) | ORAL | Status: DC | PRN

## 2018-11-13 MED ORDER — CEFAZOLIN SODIUM-DEXTROSE 2-4 GM/100ML-% IV SOLN
2.0000 g | Freq: Four times a day (QID) | INTRAVENOUS | Status: AC
  Administered 2018-11-13: 2 g via INTRAVENOUS
  Filled 2018-11-13 (×2): qty 100

## 2018-11-13 MED ORDER — FENTANYL CITRATE (PF) 100 MCG/2ML IJ SOLN
INTRAMUSCULAR | Status: AC
  Administered 2018-11-13: 100 ug via INTRAVENOUS
  Filled 2018-11-13: qty 2

## 2018-11-13 MED ORDER — BUPIVACAINE-EPINEPHRINE (PF) 0.5% -1:200000 IJ SOLN
INTRAMUSCULAR | Status: DC | PRN
  Administered 2018-11-13: 30 mL via PERINEURAL

## 2018-11-13 MED ORDER — METOCLOPRAMIDE HCL 5 MG/ML IJ SOLN: 5.0000 mg | Freq: Three times a day (TID) | INTRAMUSCULAR | Status: DC | PRN

## 2018-11-13 MED ORDER — ONDANSETRON HCL 4 MG/2ML IJ SOLN
INTRAMUSCULAR | Status: DC | PRN
  Administered 2018-11-13: 4 mg via INTRAVENOUS

## 2018-11-13 MED ORDER — MENTHOL 3 MG MT LOZG: 1.0000 | LOZENGE | OROMUCOSAL | Status: DC | PRN

## 2018-11-13 MED ORDER — CEFAZOLIN SODIUM-DEXTROSE 2-4 GM/100ML-% IV SOLN
2.0000 g | INTRAVENOUS | Status: AC
  Administered 2018-11-13: 2 g via INTRAVENOUS
  Filled 2018-11-13: qty 100

## 2018-11-13 MED ORDER — METOCLOPRAMIDE HCL 5 MG/ML IJ SOLN: 10.0000 mg | Freq: Once | INTRAMUSCULAR | Status: DC | PRN

## 2018-11-13 MED ORDER — EPINEPHRINE PF 1 MG/ML IJ SOLN
INTRAMUSCULAR | Status: DC | PRN
  Administered 2018-11-13: .15 mL

## 2018-11-13 MED ORDER — DOXYCYCLINE CALCIUM 50 MG/5ML PO SYRP
20.0000 mg | ORAL_SOLUTION | Freq: Two times a day (BID) | ORAL | Status: DC
  Filled 2018-11-13: qty 2

## 2018-11-13 MED ORDER — 0.9 % SODIUM CHLORIDE (POUR BTL) OPTIME
TOPICAL | Status: DC | PRN
  Administered 2018-11-13: 1000 mL

## 2018-11-13 MED ORDER — LACTATED RINGERS IV SOLN
INTRAVENOUS | Status: DC
  Administered 2018-11-13: 10:00:00 via INTRAVENOUS

## 2018-11-13 MED ORDER — ASPIRIN EC 325 MG PO TBEC
325.0000 mg | DELAYED_RELEASE_TABLET | Freq: Two times a day (BID) | ORAL | Status: DC
  Administered 2018-11-14: 325 mg via ORAL
  Filled 2018-11-13: qty 1

## 2018-11-13 MED ORDER — GLYCOPYRROLATE 0.2 MG/ML IJ SOLN
INTRAMUSCULAR | Status: DC | PRN
  Administered 2018-11-13: 0.1 mg via INTRAVENOUS

## 2018-11-13 MED ORDER — EPINEPHRINE PF 1 MG/ML IJ SOLN
INTRAMUSCULAR | Status: AC
  Filled 2018-11-13: qty 1

## 2018-11-13 MED ORDER — METOCLOPRAMIDE HCL 5 MG PO TABS: 5.0000 mg | ORAL_TABLET | Freq: Three times a day (TID) | ORAL | Status: DC | PRN

## 2018-11-13 MED ORDER — MIDAZOLAM HCL 2 MG/2ML IJ SOLN
INTRAMUSCULAR | Status: AC
  Administered 2018-11-13: 1 mg via INTRAVENOUS
  Filled 2018-11-13: qty 2

## 2018-11-13 MED ORDER — SODIUM CHLORIDE 0.9 % IV SOLN
INTRAVENOUS | Status: DC | PRN
  Administered 2018-11-13: 30 ug/min via INTRAVENOUS

## 2018-11-13 MED ORDER — ONDANSETRON HCL 4 MG/2ML IJ SOLN: 4.0000 mg | Freq: Four times a day (QID) | INTRAMUSCULAR | Status: DC | PRN

## 2018-11-13 MED ORDER — DIPHENHYDRAMINE HCL 12.5 MG/5ML PO ELIX: 12.5000 mg | ORAL_SOLUTION | ORAL | Status: DC | PRN

## 2018-11-13 MED ORDER — HYDROCODONE-ACETAMINOPHEN 5-325 MG PO TABS
1.0000 | ORAL_TABLET | ORAL | Status: DC | PRN
  Administered 2018-11-13 – 2018-11-14 (×2): 2 via ORAL
  Filled 2018-11-13: qty 2

## 2018-11-13 MED ORDER — FLUOXETINE HCL 20 MG PO CAPS
20.0000 mg | ORAL_CAPSULE | Freq: Every day | ORAL | Status: DC
  Administered 2018-11-13 – 2018-11-14 (×2): 20 mg via ORAL
  Filled 2018-11-13 (×2): qty 1

## 2018-11-13 MED ORDER — KETOROLAC TROMETHAMINE 15 MG/ML IJ SOLN
7.5000 mg | Freq: Four times a day (QID) | INTRAMUSCULAR | Status: AC
  Administered 2018-11-13 – 2018-11-14 (×4): 7.5 mg via INTRAVENOUS
  Filled 2018-11-13 (×3): qty 1

## 2018-11-13 MED ORDER — METHOCARBAMOL 500 MG PO TABS
500.0000 mg | ORAL_TABLET | Freq: Four times a day (QID) | ORAL | Status: DC | PRN
  Administered 2018-11-13 – 2018-11-14 (×3): 500 mg via ORAL
  Filled 2018-11-13 (×2): qty 1

## 2018-11-13 MED ORDER — PROPOFOL 500 MG/50ML IV EMUL
INTRAVENOUS | Status: DC | PRN
  Administered 2018-11-13: 70 ug/kg/min via INTRAVENOUS

## 2018-11-13 MED ORDER — PROPOFOL 10 MG/ML IV BOLUS
INTRAVENOUS | Status: DC | PRN
  Administered 2018-11-13: 50 mg via INTRAVENOUS
  Administered 2018-11-13 (×2): 20 mg via INTRAVENOUS

## 2018-11-13 MED ORDER — KETOROLAC TROMETHAMINE 15 MG/ML IJ SOLN
INTRAMUSCULAR | Status: AC
  Filled 2018-11-13: qty 1

## 2018-11-13 MED ORDER — BUPIVACAINE IN DEXTROSE 0.75-8.25 % IT SOLN
INTRATHECAL | Status: DC | PRN
  Administered 2018-11-13: 1.6 mL via INTRATHECAL

## 2018-11-13 MED ORDER — DEXAMETHASONE SODIUM PHOSPHATE 10 MG/ML IJ SOLN
INTRAMUSCULAR | Status: DC | PRN
  Administered 2018-11-13: 10 mg via INTRAVENOUS

## 2018-11-13 MED ORDER — TRANEXAMIC ACID 1000 MG/10ML IV SOLN
INTRAVENOUS | Status: DC | PRN
  Administered 2018-11-13: 2000 mg via TOPICAL

## 2018-11-13 MED ORDER — LACTATED RINGERS IV SOLN
INTRAVENOUS | Status: DC
  Administered 2018-11-13: 14:00:00 via INTRAVENOUS

## 2018-11-13 MED ORDER — METHOCARBAMOL 1000 MG/10ML IJ SOLN
500.0000 mg | Freq: Four times a day (QID) | INTRAVENOUS | Status: DC | PRN
  Filled 2018-11-13: qty 5

## 2018-11-13 MED ORDER — LIDOCAINE 2% (20 MG/ML) 5 ML SYRINGE
INTRAMUSCULAR | Status: DC | PRN
  Administered 2018-11-13: 30 mg via INTRAVENOUS

## 2018-11-13 MED ORDER — TRANEXAMIC ACID-NACL 1000-0.7 MG/100ML-% IV SOLN
1000.0000 mg | INTRAVENOUS | Status: AC
  Administered 2018-11-13: 1000 mg via INTRAVENOUS
  Filled 2018-11-13: qty 100

## 2018-11-13 MED ORDER — SODIUM CHLORIDE 0.9 % IR SOLN
Status: DC | PRN
  Administered 2018-11-13: 3000 mL

## 2018-11-13 MED ORDER — BUPIVACAINE HCL (PF) 0.5 % IJ SOLN
INTRAMUSCULAR | Status: AC
  Filled 2018-11-13: qty 30

## 2018-11-13 MED ORDER — CLINDAMYCIN PHOSPHATE 1 % EX GEL
1.0000 "application " | Freq: Every day | CUTANEOUS | Status: DC
  Filled 2018-11-13: qty 30

## 2018-11-13 MED ORDER — ONDANSETRON HCL 4 MG PO TABS: 4.0000 mg | ORAL_TABLET | Freq: Four times a day (QID) | ORAL | Status: DC | PRN

## 2018-11-13 MED ORDER — MIDAZOLAM HCL 2 MG/2ML IJ SOLN
1.0000 mg | Freq: Once | INTRAMUSCULAR | Status: AC
  Administered 2018-11-13: 1 mg via INTRAVENOUS

## 2018-11-13 MED ORDER — METHOCARBAMOL 500 MG PO TABS
ORAL_TABLET | ORAL | Status: AC
  Filled 2018-11-13: qty 1

## 2018-11-13 MED ORDER — PHENOL 1.4 % MT LIQD: 1.0000 | OROMUCOSAL | Status: DC | PRN

## 2018-11-13 MED ORDER — ACETAMINOPHEN 500 MG PO TABS
500.0000 mg | ORAL_TABLET | Freq: Four times a day (QID) | ORAL | Status: AC
  Administered 2018-11-13 – 2018-11-14 (×4): 500 mg via ORAL
  Filled 2018-11-13 (×3): qty 1

## 2018-11-13 MED ORDER — BISACODYL 5 MG PO TBEC: 5.0000 mg | DELAYED_RELEASE_TABLET | Freq: Every day | ORAL | Status: DC | PRN

## 2018-11-13 MED ORDER — MORPHINE SULFATE (PF) 2 MG/ML IV SOLN: 0.5000 mg | INTRAVENOUS | Status: DC | PRN

## 2018-11-13 MED ORDER — CHLORHEXIDINE GLUCONATE 4 % EX LIQD: 60.0000 mL | Freq: Once | CUTANEOUS | Status: DC

## 2018-11-13 MED ORDER — TRANEXAMIC ACID 1000 MG/10ML IV SOLN
2000.0000 mg | INTRAVENOUS | Status: DC
  Filled 2018-11-13: qty 20

## 2018-11-13 MED ORDER — ACETAMINOPHEN 500 MG PO TABS
ORAL_TABLET | ORAL | Status: AC
  Filled 2018-11-13: qty 1

## 2018-11-13 MED ORDER — SODIUM CHLORIDE 0.9% FLUSH
INTRAVENOUS | Status: DC | PRN
  Administered 2018-11-13: 10 mL

## 2018-11-13 MED ORDER — ALUM & MAG HYDROXIDE-SIMETH 200-200-20 MG/5ML PO SUSP: 30.0000 mL | ORAL | Status: DC | PRN

## 2018-11-13 MED ORDER — FENTANYL CITRATE (PF) 100 MCG/2ML IJ SOLN: 25.0000 ug | INTRAMUSCULAR | Status: DC | PRN

## 2018-11-13 MED ORDER — HYDROCODONE-ACETAMINOPHEN 5-325 MG PO TABS
ORAL_TABLET | ORAL | Status: AC
  Filled 2018-11-13: qty 2

## 2018-11-13 MED ORDER — ROSUVASTATIN CALCIUM 5 MG PO TABS
10.0000 mg | ORAL_TABLET | Freq: Every day | ORAL | Status: DC
  Administered 2018-11-13 – 2018-11-14 (×2): 10 mg via ORAL
  Filled 2018-11-13 (×2): qty 2

## 2018-11-13 MED ORDER — BUPIVACAINE LIPOSOME 1.3 % IJ SUSP
INTRAMUSCULAR | Status: DC | PRN
  Administered 2018-11-13: 20 mL

## 2018-11-13 MED ORDER — BUPIVACAINE LIPOSOME 1.3 % IJ SUSP
20.0000 mL | Freq: Once | INTRAMUSCULAR | Status: DC
  Filled 2018-11-13: qty 20

## 2018-11-13 MED ORDER — FENTANYL CITRATE (PF) 100 MCG/2ML IJ SOLN
100.0000 ug | Freq: Once | INTRAMUSCULAR | Status: AC
  Administered 2018-11-13: 100 ug via INTRAVENOUS

## 2018-11-13 MED ORDER — BUPIVACAINE HCL (PF) 0.5 % IJ SOLN
INTRAMUSCULAR | Status: DC | PRN
  Administered 2018-11-13: 30 mL

## 2018-11-13 MED ORDER — PROMETHAZINE HCL 25 MG PO TABS: 12.5000 mg | ORAL_TABLET | Freq: Four times a day (QID) | ORAL | Status: DC | PRN

## 2018-11-13 MED ORDER — DOCUSATE SODIUM 100 MG PO CAPS
100.0000 mg | ORAL_CAPSULE | Freq: Two times a day (BID) | ORAL | Status: DC
  Administered 2018-11-13 – 2018-11-14 (×2): 100 mg via ORAL
  Filled 2018-11-13 (×2): qty 1

## 2018-11-13 MED ORDER — TRANEXAMIC ACID-NACL 1000-0.7 MG/100ML-% IV SOLN
1000.0000 mg | Freq: Once | INTRAVENOUS | Status: AC
  Administered 2018-11-13: 1000 mg via INTRAVENOUS
  Filled 2018-11-13: qty 100

## 2018-11-13 SURGICAL SUPPLY — 53 items
ATTUNE PS FEM LT SZ 5 CEM KNEE (Femur) ×2 IMPLANT
ATTUNE PSRP INSR SZ5 6 KNEE (Insert) ×2 IMPLANT
BAG DECANTER FOR FLEXI CONT (MISCELLANEOUS) IMPLANT
BANDAGE ESMARK 6X9 LF (GAUZE/BANDAGES/DRESSINGS) ×1 IMPLANT
BASEPLATE TIBIAL ROTATING SZ 4 (Knees) ×2 IMPLANT
BLADE SAGITTAL 25.0X1.19X90 (BLADE) ×2 IMPLANT
BLADE SAW SGTL 13.0X1.19X90.0M (BLADE) IMPLANT
BNDG ELASTIC 6X10 VLCR STRL LF (GAUZE/BANDAGES/DRESSINGS) ×2 IMPLANT
BNDG ESMARK 6X9 LF (GAUZE/BANDAGES/DRESSINGS) ×2
BOWL SMART MIX CTS (DISPOSABLE) ×2 IMPLANT
CEMENT HV SMART SET (Cement) ×4 IMPLANT
COVER SURGICAL LIGHT HANDLE (MISCELLANEOUS) ×2 IMPLANT
COVER WAND RF STERILE (DRAPES) ×2 IMPLANT
CUFF TOURNIQUET SINGLE 34IN LL (TOURNIQUET CUFF) ×2 IMPLANT
CUFF TOURNIQUET SINGLE 44IN (TOURNIQUET CUFF) IMPLANT
DECANTER SPIKE VIAL GLASS SM (MISCELLANEOUS) ×2 IMPLANT
DRAPE EXTREMITY T 121X128X90 (DISPOSABLE) ×2 IMPLANT
DRAPE HALF SHEET 40X57 (DRAPES) ×4 IMPLANT
DRAPE U-SHAPE 47X51 STRL (DRAPES) ×2 IMPLANT
DRSG AQUACEL AG ADV 3.5X10 (GAUZE/BANDAGES/DRESSINGS) ×2 IMPLANT
DURAPREP 26ML APPLICATOR (WOUND CARE) ×2 IMPLANT
ELECT REM PT RETURN 9FT ADLT (ELECTROSURGICAL) ×2
ELECTRODE REM PT RTRN 9FT ADLT (ELECTROSURGICAL) ×1 IMPLANT
GLOVE BIO SURGEON STRL SZ8 (GLOVE) ×4 IMPLANT
GLOVE BIOGEL PI IND STRL 8 (GLOVE) ×2 IMPLANT
GLOVE BIOGEL PI INDICATOR 8 (GLOVE) ×2
GOWN STRL REUS W/ TWL LRG LVL3 (GOWN DISPOSABLE) ×1 IMPLANT
GOWN STRL REUS W/ TWL XL LVL3 (GOWN DISPOSABLE) ×2 IMPLANT
GOWN STRL REUS W/TWL LRG LVL3 (GOWN DISPOSABLE) ×1
GOWN STRL REUS W/TWL XL LVL3 (GOWN DISPOSABLE) ×2
HANDPIECE INTERPULSE COAX TIP (DISPOSABLE) ×1
HOOD PEEL AWAY FACE SHEILD DIS (HOOD) ×4 IMPLANT
KIT BASIN OR (CUSTOM PROCEDURE TRAY) ×2 IMPLANT
KIT TURNOVER KIT B (KITS) ×2 IMPLANT
MANIFOLD NEPTUNE II (INSTRUMENTS) ×2 IMPLANT
NEEDLE HYPO 21X1 ECLIPSE (NEEDLE) ×2 IMPLANT
NS IRRIG 1000ML POUR BTL (IV SOLUTION) ×2 IMPLANT
PACK TOTAL JOINT (CUSTOM PROCEDURE TRAY) ×2 IMPLANT
PAD ARMBOARD 7.5X6 YLW CONV (MISCELLANEOUS) ×4 IMPLANT
PATELLA MEDIAL ATTUN 35MM KNEE (Knees) ×2 IMPLANT
PIN STEINMAN FIXATION KNEE (PIN) ×2 IMPLANT
PIN THREADED HEADED SIGMA (PIN) ×2 IMPLANT
SET HNDPC FAN SPRY TIP SCT (DISPOSABLE) ×1 IMPLANT
SUT VIC AB 0 CT1 27 (SUTURE) ×1
SUT VIC AB 0 CT1 27XBRD ANBCTR (SUTURE) ×1 IMPLANT
SUT VIC AB 2-0 CT1 27 (SUTURE) ×1
SUT VIC AB 2-0 CT1 TAPERPNT 27 (SUTURE) ×1 IMPLANT
SUT VIC AB 3-0 FS2 27 (SUTURE) ×2 IMPLANT
SUT VLOC 180 0 24IN GS25 (SUTURE) ×2 IMPLANT
SYR 50ML LL SCALE MARK (SYRINGE) ×2 IMPLANT
TOWEL OR 17X24 6PK STRL BLUE (TOWEL DISPOSABLE) ×2 IMPLANT
TOWEL OR 17X26 10 PK STRL BLUE (TOWEL DISPOSABLE) ×2 IMPLANT
TRAY CATH 16FR W/PLASTIC CATH (SET/KITS/TRAYS/PACK) IMPLANT

## 2018-11-13 NOTE — Care Plan (Signed)
Ortho Bundle Case Management Note  Patient Details  Name: Kimberly Lowery MRN: [id number] Date of Birth: 09-Jun-1946    Spoke with patient prior to surgery. Will discharge to home with family.  Has all equipment at home. She is aware and agreeable. Choice offered.                 DME Arranged:    DME Agency:     HH Arranged:  PT HH Agency:  [facility]  Additional Comments: Please contact me with any questions of if this plan should need to change.  Ladell Heads,  [place] Specialist  [phone] 11/13/2018, 11:35 AM

## 2018-11-13 NOTE — Op Note (Signed)
PREOP DIAGNOSIS: DJD LEFT KNEE POSTOP DIAGNOSIS:  same PROCEDURE: LEFT TKR ANESTHESIA: Spinal and MAC ATTENDING SURGEON: Hessie Dibble ASSISTANT: Loni Dolly PA  INDICATIONS FOR PROCEDURE: Kimberly Lowery is a 73 y.o. female who has struggled for a long time with pain due to degenerative arthritis of the left knee.  The patient has failed many conservative non-operative measures and at this point has pain which limits the ability to sleep and walk.  The patient is offered total knee replacement.  Informed operative consent was obtained after discussion of possible risks of anesthesia, infection, neurovascular injury, DVT, and death.  The importance of the post-operative rehabilitation protocol to optimize result was stressed extensively with the patient.  SUMMARY OF FINDINGS AND PROCEDURE:  Kimberly Lowery was taken to the operative suite where under the above anesthesia a left knee replacement was performed.  There were advanced degenerative changes and the bone quality was good.  We used the DePuyAttune system and placed size 5 femur, 4 tibia, 35 mm all polyethylene patella, and a size 6 mm spacer.  Loni Dolly PA-C assisted throughout and was invaluable to the completion of the case in that he helped retract and maintain exposure while I placed the components.  He also helped close thereby minimizing OR time.  The patient was admitted for appropriate post-op care to include perioperative antibiotics and mechanical and pharmacologic measures for DVT prophylaxis.  DESCRIPTION OF PROCEDURE:  Kimberly Lowery was taken to the operative suite where the above anesthesia was applied.  The patient was positioned supine and prepped and draped in normal sterile fashion.  An appropriate time out was performed.  After the administration of kefzol pre-op antibiotic the leg was elevated and exsanguinated and a tourniquet inflated.  A standard longitudinal incision was made on the anterior knee.  Dissection was  carried down to the extensor mechanism.  All appropriate anti-infective measures were used including the pre-operative antibiotic, betadine impregnated drape, and closed hooded exhaust systems for each member of the surgical team.  A medial parapatellar incision was made in the extensor mechanism and the knee cap flipped and the knee flexed.  Some residual meniscal tissues were removed along with any remaining ACL/PCL tissue.  A guide was placed on the tibia and a flat cut was made on it's superior surface.  An intramedullary guide was placed in the femur and was utilized to make anterior and posterior cuts creating an appropriate flexion gap.  A second intramedullary guide was placed in the femur to make a distal cut properly balancing the knee with an extension gap equal to the flexion gap.  The three bones sized to the above mentioned sizes and the appropriate guides were placed and utilized.  A trial reduction was done and the knee easily came to full extension and the patella tracked well on flexion.  The trial components were removed and all bones were cleaned with pulsatile lavage and then dried thoroughly.  Cement was mixed and was pressurized onto the bones followed by placement of the aforementioned components.  Excess cement was trimmed and pressure was held on the components until the cement had hardened.  The tourniquet was deflated and a small amount of bleeding was controlled with cautery and pressure.  The knee was irrigated thoroughly.  The extensor mechanism was re-approximated with V-loc suture in running fashion.  The knee was flexed and the repair was solid.  The subcutaneous tissues were re-approximated with #0 and #2-0 vicryl and the skin  closed with a subcuticular stitch and steristrips.  A sterile dressing was applied.  Intraoperative fluids, EBL, and tourniquet time can be obtained from anesthesia records.  DISPOSITION:  The patient was taken to recovery room in stable condition and  admitted for appropriate post-op care to include peri-operative antibiotic and DVT prophylaxis with mechanical and pharmacologic measures.  Kimberly Lowery 11/13/2018, 11:59 AM

## 2018-11-13 NOTE — Interval H&P Note (Signed)
History and Physical Interval Note:  11/13/2018 9:23 AM  Kimberly Lowery  has presented today for surgery, with the diagnosis of Left Knee Degenerative Joint Disease  The various methods of treatment have been discussed with the patient and family. After consideration of risks, benefits and other options for treatment, the patient has consented to  Procedure(s): TOTAL KNEE ARTHROPLASTY (Left) as a surgical intervention .  The patient's history has been reviewed, patient examined, no change in status, stable for surgery.  I have reviewed the patient's chart and labs.  Questions were answered to the patient's satisfaction.     Hessie Dibble

## 2018-11-13 NOTE — Anesthesia Preprocedure Evaluation (Addendum)
Anesthesia Evaluation  Patient identified by MRN, date of birth, ID band Patient awake    Reviewed: Allergy & Precautions, NPO status , Patient's Chart, lab work & pertinent test results  History of Anesthesia Complications (+) PONV, Family history of anesthesia reaction and history of anesthetic complications  Airway Mallampati: II  TM Distance: >3 FB Neck ROM: Full    Dental no notable dental hx. (+) Teeth Intact, Caps,    Pulmonary asthma , sleep apnea ,    Pulmonary exam normal breath sounds clear to auscultation       Cardiovascular negative cardio ROS Normal cardiovascular exam Rhythm:Regular Rate:Normal     Neuro/Psych PSYCHIATRIC DISORDERS Anxiety Depression negative neurological ROS     GI/Hepatic Neg liver ROS, hiatal hernia,   Endo/Other  Hyperlipidemia  Renal/GU negative Renal ROS  negative genitourinary   Musculoskeletal  (+) Arthritis , Osteoarthritis,  OA left knee   Abdominal   Peds  Hematology negative hematology ROS (+)   Anesthesia Other Findings   Reproductive/Obstetrics                            Anesthesia Physical Anesthesia Plan  ASA: II  Anesthesia Plan: Spinal   Post-op Pain Management:  Regional for Post-op pain   Induction: Intravenous  PONV Risk Score and Plan: 4 or greater and Ondansetron, Dexamethasone, Treatment may vary due to age or medical condition and Propofol infusion  Airway Management Planned: Nasal Cannula, Simple Face Mask and Natural Airway  Additional Equipment:   Intra-op Plan:   Post-operative Plan:   Informed Consent: I have reviewed the patients History and Physical, chart, labs and discussed the procedure including the risks, benefits and alternatives for the proposed anesthesia with the patient or authorized representative who has indicated his/her understanding and acceptance.     Dental advisory given  Plan Discussed  with: CRNA and Surgeon  Anesthesia Plan Comments:         Anesthesia Quick Evaluation

## 2018-11-13 NOTE — Anesthesia Procedure Notes (Signed)
Anesthesia Regional Block: Adductor canal block   Pre-Anesthetic Checklist: ,, timeout performed, Correct Patient, Correct Site, Correct Laterality, Correct Procedure, Correct Position, site marked, Risks and benefits discussed,  Surgical consent,  Pre-op evaluation,  At surgeon's request and post-op pain management  Laterality: Left  Prep: chloraprep       Needles:  Injection technique: Single-shot  Needle Type: Echogenic Stimulator Needle     Needle Length: 9cm  Needle Gauge: 21   Needle insertion depth: 8 cm   Additional Needles:   Procedures:,,,, ultrasound used (permanent image in chart),,,,  Narrative:  Start time: 11/13/2018 9:42 AM End time: 11/13/2018 9:47 AM Injection made incrementally with aspirations every 5 mL.  Performed by: Personally  Anesthesiologist: Josephine Igo, MD  Additional Notes: Timeout performed. Patient sedated. Relevant anatomy ID'd using [place]. Incremental 2-25ml injection of LA with frequent aspiration. Patient tolerated procedure well.        Left Adductor Canal Block

## 2018-11-13 NOTE — Anesthesia Procedure Notes (Signed)
Spinal  Patient location during procedure: OR Start time: 11/13/2018 10:27 AM End time: 11/13/2018 10:29 AM Staffing Anesthesiologist: Janeece Riggers, MD Preanesthetic Checklist Completed: patient identified, site marked, surgical consent, pre-op evaluation, timeout performed, IV checked, risks and benefits discussed and monitors and equipment checked Spinal Block Patient position: sitting Prep: DuraPrep Patient monitoring: heart rate, cardiac monitor, continuous pulse ox and blood pressure Approach: midline Location: L2-3 Injection technique: single-shot Needle Needle type: Sprotte  Needle gauge: 24 G Needle length: 9 cm Assessment Sensory level: T4

## 2018-11-13 NOTE — Transfer of Care (Signed)
Immediate Anesthesia Transfer of Care Note  Patient: Kimberly Lowery  Procedure(s) Performed: TOTAL KNEE ARTHROPLASTY (Left )  Patient Location: PACU  Anesthesia Type:Spinal and MAC combined with regional for post-op pain  Level of Consciousness: awake, alert , oriented and patient cooperative  Airway & Oxygen Therapy: Patient Spontanous Breathing and Patient connected to face mask oxygen  Post-op Assessment: Report given to RN and Post -op Vital signs reviewed and stable  Post vital signs: Reviewed and stable  Last Vitals:  Vitals Value Taken Time  BP 117/96 11/13/2018 12:28 PM  Temp    Pulse 87 11/13/2018 12:30 PM  Resp 17 11/13/2018 12:30 PM  SpO2 100 % 11/13/2018 12:30 PM  Vitals shown include unvalidated device data.  Last Pain:  Vitals:   11/13/18 0849  PainSc: 8       Patients Stated Pain Goal: 2 (87/86/76 7209)  Complications: No apparent anesthesia complications

## 2018-11-13 NOTE — Evaluation (Signed)
Physical Therapy Evaluation Patient Details Name: Kimberly Lowery MRN: [id number] DOB: 1946-02-07 Today's Date: 11/13/2018   History of Present Illness  Pt is a 73 y/o female s/p elective L TKA. PMH includes asthma, sleep apnea, Bilateral THA, and lumbar fusion.   Clinical Impression  Pt is s/p surgery above with deficits below. Pt with L knee instability and requiring min to min guard A for mobility using RW this session. Educated about knee precautions and supine HEP. Will continue to follow acutely to maximize functional mobility independence and safety.     Follow Up Recommendations Follow surgeon's recommendation for DC plan and follow-up therapies    Equipment Recommendations  None recommended by PT    Recommendations for Other Services       Precautions / Restrictions Precautions Precautions: Knee Precaution Booklet Issued: Yes (comment) Precaution Comments: REviewed knee precautions with pt.  Restrictions Weight Bearing Restrictions: Yes LLE Weight Bearing: Weight bearing as tolerated      Mobility  Bed Mobility Overal bed mobility: Needs Assistance Bed Mobility: Supine to Sit     Supine to sit: Supervision     General bed mobility comments: Supervision for safety.   Transfers Overall transfer level: Needs assistance Equipment used: Rolling walker (2 wheeled) Transfers: Sit to/from Stand Sit to Stand: Min assist         General transfer comment: Min A for lift assist. Cues for safe hand placement.   Ambulation/Gait Ambulation/Gait assistance: Min assist;Min guard Gait Distance (Feet): 5 Feet Assistive device: Rolling walker (2 wheeled) Gait Pattern/deviations: Step-to pattern;Decreased step length - right;Decreased step length - left;Decreased weight shift to left;Antalgic Gait velocity: Decreased    General Gait Details: Slow, antalgic gait. L knee instability noted throughout, so mobility limited to chair. Required cues for sequencing using RW.    Stairs            Wheelchair Mobility    Modified Rankin (Stroke Patients Only)       Balance Overall balance assessment: Needs assistance Sitting-balance support: No upper extremity supported;Feet supported Sitting balance-Leahy Scale: Good     Standing balance support: Bilateral upper extremity supported;During functional activity Standing balance-Leahy Scale: Poor Standing balance comment: Reliant on BUE support                              Pertinent Vitals/Pain Pain Assessment: Faces Faces Pain Scale: Hurts even more Pain Location: L knee  Pain Descriptors / Indicators: Aching;Operative site guarding Pain Intervention(s): Limited activity within patient's tolerance;Monitored during session;Repositioned    Home Living Family/patient expects to be discharged to:: Private residence Living Arrangements: Spouse/significant other Available Help at Discharge: Family Type of Home: House Home Access: Stairs to enter Entrance Stairs-Rails: None Entrance Stairs-Number of Steps: 2 Home Layout: Two level;Able to live on main level with bedroom/bathroom Home Equipment: Gilford Rile - 2 wheels;Cane - single point;Bedside commode      Prior Function Level of Independence: Independent with assistive device(s)         Comments: Was using cane vs RW depending on the day.      Hand Dominance        Extremity/Trunk Assessment   Upper Extremity Assessment Upper Extremity Assessment: Overall WFL for tasks assessed    Lower Extremity Assessment Lower Extremity Assessment: LLE deficits/detail LLE Deficits / Details: Deficits consistent with post op pain and weakness. Able to perform ther ex below.     Cervical / Trunk Assessment Cervical /  Trunk Assessment: Normal  Communication   Communication: No difficulties  Cognition Arousal/Alertness: Awake/alert Behavior During Therapy: WFL for tasks assessed/performed Overall Cognitive Status: Within Functional  Limits for tasks assessed                                        General Comments General comments (skin integrity, edema, etc.): Pt's husband present throughout session     Exercises Total Joint Exercises Ankle Circles/Pumps: AROM;Both;20 reps Quad Sets: AROM;Left;10 reps Heel Slides: AROM;Left;5 reps   Assessment/Plan    PT Assessment Patient needs continued PT services  PT Problem List Decreased strength;Decreased range of motion;Decreased activity tolerance;Decreased balance;Decreased mobility;Decreased coordination;Decreased knowledge of use of DME;Decreased knowledge of precautions;Pain       PT Treatment Interventions DME instruction;Gait training;Stair training;Functional mobility training;Therapeutic activities;Therapeutic exercise;Balance training;Patient/family education    PT Goals (Current goals can be found in the Care Plan section)  Acute Rehab PT Goals Patient Stated Goal: to go home  PT Goal Formulation: With patient Time For Goal Achievement: 11/27/18 Potential to Achieve Goals: Good    Frequency 7X/week   Barriers to discharge        Co-evaluation               AM-PAC PT "6 Clicks" Mobility  Outcome Measure Help needed turning from your back to your side while in a flat bed without using bedrails?: None Help needed moving from lying on your back to sitting on the side of a flat bed without using bedrails?: A Little Help needed moving to and from a bed to a chair (including a wheelchair)?: A Little Help needed standing up from a chair using your arms (e.g., wheelchair or bedside chair)?: A Little Help needed to walk in hospital room?: A Little Help needed climbing 3-5 steps with a railing? : A Lot 6 Click Score: 18    End of Session Equipment Utilized During Treatment: Gait belt Activity Tolerance: Patient tolerated treatment well Patient left: in chair;with call bell/phone within reach;with family/visitor present Nurse  Communication: Mobility status PT Visit Diagnosis: Unsteadiness on feet (R26.81);Muscle weakness (generalized) (M62.81);Pain Pain - Right/Left: Left Pain - part of body: Knee    Time: 1800-1820 PT Time Calculation (min) (ACUTE ONLY): 20 min   Charges:   PT Evaluation $PT Eval Low Complexity: Wells, PT, DPT  Acute Rehabilitation Services  Pager: [phone] Office: [phone]   Rudean Hitt 11/13/2018, 6:32 PM

## 2018-11-13 NOTE — Anesthesia Postprocedure Evaluation (Signed)
Anesthesia Post Note  Patient: Kimberly Lowery  Procedure(s) Performed: TOTAL KNEE ARTHROPLASTY (Left )     Patient location during evaluation: PACU Anesthesia Type: Spinal Level of consciousness: oriented and awake and alert Pain management: pain level controlled Vital Signs Assessment: post-procedure vital signs reviewed and stable Respiratory status: spontaneous breathing, respiratory function stable and nonlabored ventilation Cardiovascular status: blood pressure returned to baseline and stable Postop Assessment: no headache, no backache, no apparent nausea or vomiting and spinal receding Anesthetic complications: no    Last Vitals:  Vitals:   11/13/18 1258 11/13/18 1313  BP: (!) 153/94 134/81  Pulse: 89 72  Resp: 12 13  Temp:    SpO2: 100% 100%    Last Pain:  Vitals:   11/13/18 1315  PainSc: 0-No pain    LLE Motor Response: No movement due to regional block (11/13/18 1315) LLE Sensation: Numbness (11/13/18 1315) RLE Motor Response: No movement due to regional block (11/13/18 1315) RLE Sensation: Numbness (11/13/18 1315) L Sensory Level: L2-Upper inner thigh, upper buttock (11/13/18 1315) R Sensory Level: L2-Upper inner thigh, upper buttock (11/13/18 1315)  Ernestina Joe A.

## 2018-11-14 ENCOUNTER — Encounter (HOSPITAL_COMMUNITY): Payer: Self-pay | Admitting: Orthopaedic Surgery

## 2018-11-14 DIAGNOSIS — M1712 Unilateral primary osteoarthritis, left knee: Secondary | ICD-10-CM | POA: Diagnosis not present

## 2018-11-14 MED ORDER — HYDROCODONE-ACETAMINOPHEN 5-325 MG PO TABS: 1.0000 | ORAL_TABLET | Freq: Four times a day (QID) | ORAL | 0 refills | Status: DC | PRN

## 2018-11-14 MED ORDER — ASPIRIN 325 MG PO TBEC: 325.0000 mg | DELAYED_RELEASE_TABLET | Freq: Two times a day (BID) | ORAL | 0 refills | Status: DC

## 2018-11-14 MED ORDER — PROMETHAZINE HCL 12.5 MG PO TABS: 12.5000 mg | ORAL_TABLET | Freq: Four times a day (QID) | ORAL | 1 refills | Status: DC | PRN

## 2018-11-14 MED ORDER — TIZANIDINE HCL 4 MG PO TABS: 4.0000 mg | ORAL_TABLET | Freq: Four times a day (QID) | ORAL | 1 refills | Status: DC | PRN

## 2018-11-14 NOTE — Progress Notes (Signed)
Physical Therapy Treatment Patient Details Name: Kimberly Lowery MRN: [id number] DOB: [birth year] Today's Date: 11/14/2018    History of Present Illness Pt is a 73 y/o female s/p elective L TKA. PMH includes asthma, sleep apnea, Bilateral THA, and lumbar fusion.     PT Comments    Patient is making good progress with PT.  From a mobility standpoint anticipate patient will be ready for DC home when medically ready.    Follow Up Recommendations  Follow surgeon's recommendation for DC plan and follow-up therapies     Equipment Recommendations  None recommended by PT    Recommendations for Other Services       Precautions / Restrictions Precautions Precautions: Knee Precaution Comments: Reviewed knee precautions/positioning with pt  Restrictions Weight Bearing Restrictions: Yes LLE Weight Bearing: Weight bearing as tolerated    Mobility  Bed Mobility               General bed mobility comments: pt OOB in chair upon arrival  Transfers Overall transfer level: Needs assistance Equipment used: Rolling walker (2 wheeled) Transfers: Sit to/from Stand Sit to Stand: Min guard         General transfer comment: min guard for safety  Ambulation/Gait Ambulation/Gait assistance: Min guard;Supervision Gait Distance (Feet): 160 Feet Assistive device: Rolling walker (2 wheeled) Gait Pattern/deviations: Decreased weight shift to left;Step-through pattern;Decreased stride length Gait velocity: Decreased    General Gait Details: cues for L knee quad activation during stance phase   Stairs Stairs: Yes Stairs assistance: Min assist Stair Management: No rails;Step to pattern;Backwards;With walker Number of Stairs: 3 General stair comments: cues for sequencing and technique   Wheelchair Mobility    Modified Rankin (Stroke Patients Only)       Balance Overall balance assessment: Needs assistance Sitting-balance support: No upper extremity supported;Feet  supported Sitting balance-Leahy Scale: Good     Standing balance support: Bilateral upper extremity supported;During functional activity Standing balance-Leahy Scale: Poor                              Cognition Arousal/Alertness: Awake/alert Behavior During Therapy: WFL for tasks assessed/performed Overall Cognitive Status: Within Functional Limits for tasks assessed                                        Exercises Total Joint Exercises Quad Sets: AROM;Left;10 reps Short Arc Quad: AROM;Strengthening;Left;10 reps Heel Slides: AROM;Strengthening;Left;10 reps Hip ABduction/ADduction: AROM;Strengthening;Left;10 reps Straight Leg Raises: AROM;Strengthening;Left;10 reps Long Arc Quad: AROM;Strengthening;Left;10 reps Knee Flexion: AROM;Left;Seated    General Comments        Pertinent Vitals/Pain Pain Assessment: Faces Faces Pain Scale: Hurts little more Pain Location: L knee  Pain Descriptors / Indicators: Guarding;Sore Pain Intervention(s): Limited activity within patient's tolerance;Monitored during session;Premedicated before session;Repositioned    Home Living                      Prior Function            PT Goals (current goals can now be found in the care plan section) Acute Rehab PT Goals Patient Stated Goal: to go home  Progress towards PT goals: Progressing toward goals    Frequency    7X/week      PT Plan Current plan remains appropriate    Co-evaluation  AM-PAC PT "6 Clicks" Mobility   Outcome Measure  Help needed turning from your back to your side while in a flat bed without using bedrails?: None Help needed moving from lying on your back to sitting on the side of a flat bed without using bedrails?: A Little Help needed moving to and from a bed to a chair (including a wheelchair)?: A Little Help needed standing up from a chair using your arms (e.g., wheelchair or bedside chair)?: A  Little Help needed to walk in hospital room?: A Little Help needed climbing 3-5 steps with a railing? : A Little 6 Click Score: 19    End of Session Equipment Utilized During Treatment: Gait belt Activity Tolerance: Patient tolerated treatment well Patient left: in chair;with call bell/phone within reach;with family/visitor present Nurse Communication: Mobility status PT Visit Diagnosis: Unsteadiness on feet (R26.81);Muscle weakness (generalized) (M62.81);Pain Pain - Right/Left: Left Pain - part of body: Knee     Time: 7510-2585 PT Time Calculation (min) (ACUTE ONLY): 34 min  Charges:  $Gait Training: 8-22 mins $Therapeutic Exercise: 8-22 mins                     Earney Navy, PTA Acute Rehabilitation Services Pager: [phone] Office: [phone]     Darliss Cheney 11/14/2018, 1:28 PM

## 2018-11-14 NOTE — Discharge Summary (Signed)
Patient ID: Kimberly Lowery MRN: [id number] DOB/AGE: 71/19/1947 71 y.o.  Admit date: 11/13/2018 Discharge date: 11/14/2018  Admission Diagnoses:  Principal Problem:   Primary localized osteoarthritis of left knee Active Problems:   Primary osteoarthritis of left knee   Discharge Diagnoses:  Same  Past Medical History:  Diagnosis Date  . Anxiety   . Arthritis    "some in my back; was in my hips" (03/22/2016)  . Asthma   . Chronic lower back pain   . Depression   . Family history of anesthesia complication    "my aunt didn't go out during OR"  . Fibrocystic breast disease   . H/O hiatal hernia   . High cholesterol   . IBS (irritable bowel syndrome)   . Obesity   . PONV (postoperative nausea and vomiting)    bad after last hip replacement  . Sleep apnea    "never RX mask" (03/22/2016)    Surgeries: Procedure(s): TOTAL KNEE ARTHROPLASTY on 11/13/2018   Consultants:   Discharged Condition: Improved  Hospital Course: ELBERTA LACHAPELLE is an 73 y.o. female who was admitted 11/13/2018 for operative treatment ofPrimary localized osteoarthritis of left knee. Patient has severe unremitting pain that affects sleep, daily activities, and work/hobbies. After pre-op clearance the patient was taken to the operating room on 11/13/2018 and underwent  Procedure(s): TOTAL KNEE ARTHROPLASTY.    Patient was given perioperative antibiotics:  Anti-infectives (From admission, onward)   Start     Dose/Rate Route Frequency Ordered Stop   11/13/18 2200  doxycycline (VIBRAMYCIN) 50 MG/5ML syrup 20 mg  Status:  Discontinued     20 mg Oral 2 times daily 11/13/18 1741 11/13/18 2010   11/13/18 1830  ceFAZolin (ANCEF) IVPB 2g/100 mL premix     2 g 200 mL/hr over 30 Minutes Intravenous Every 6 hours 11/13/18 1741 11/14/18 0629   11/13/18 0830  ceFAZolin (ANCEF) IVPB 2g/100 mL premix     2 g 200 mL/hr over 30 Minutes Intravenous On call to O.R. 11/13/18 1025 11/13/18 1040       Patient was given  sequential compression devices, early ambulation, and chemoprophylaxis to prevent DVT.  Patient benefited maximally from hospital stay and there were no complications.    Recent vital signs:  Patient Vitals for the past 24 hrs:  BP Temp Temp src Pulse Resp SpO2  11/14/18 0521 126/74 98.2 F (36.8 C) Oral 67 15 96 %  11/13/18 2013 117/76 97.7 F (36.5 C) Oral 71 15 94 %  11/13/18 1737 127/87 98.4 F (36.9 C) Oral 78 16 96 %  11/13/18 1615 (!) 148/80 98.6 F (37 C) - - 18 -  11/13/18 1515 (!) 143/87 - - - 18 -  11/13/18 1358 (!) 143/89 - - 91 18 100 %  11/13/18 1343 (!) 156/126 - - 75 15 100 %     Recent laboratory studies: No results for input(s): WBC, HGB, HCT, PLT, NA, K, CL, CO2, BUN, CREATININE, GLUCOSE, INR, CALCIUM in the last 72 hours.  Invalid input(s): PT, 2   Discharge Medications:   Allergies as of 11/14/2018      Reactions   Shellfish Allergy Hives, Nausea And Vomiting   SHRIMP LOBSTER   Tape Other (See Comments)   causes blisters   Codeine Nausea And Vomiting   Dilaudid [hydromorphone Hcl] Rash   Florastor Kids [saccharomyces Boulardii] Other (See Comments)   "SIGNIFICANT CONSTIPATION"   Garlic Other (See Comments)   Stomach Upset   Morphine Sulfate Nausea And  Vomiting   Nucynta [tapentadol] Other (See Comments)   hallucinations    Percocet [oxycodone-acetaminophen] Nausea And Vomiting      Medication List    STOP taking these medications   diclofenac 75 MG EC tablet Commonly known as:  VOLTAREN     TAKE these medications   amoxicillin 500 MG tablet Commonly known as:  AMOXIL Take 2,000 mg by mouth See admin instructions. Before dental procedures   aspirin 325 MG EC tablet Take 1 tablet (325 mg total) by mouth 2 (two) times daily after a meal. What changed:    medication strength  how much to take  when to take this   Biotin 1000 MCG Chew Chew 1 each by mouth daily.   clindamycin 1 % lotion Commonly known as:  CLEOCIN T Apply 1  application topically every morning.   desonide 0.05 % ointment Commonly known as:  DESOWEN Apply 1 application topically daily as needed (rash on neck).   diclofenac sodium 1 % Gel Commonly known as:  VOLTAREN Apply 1 application topically 2 (two) times daily as needed.   doxycycline 20 MG tablet Commonly known as:  PERIOSTAT Take 20 mg by mouth 2 (two) times daily.   FLUoxetine 20 MG capsule Commonly known as:  PROZAC Take 20 mg by mouth daily.   HYDROcodone-acetaminophen 5-325 MG tablet Commonly known as:  NORCO/VICODIN Take 1-2 tablets by mouth every 6 (six) hours as needed for moderate pain (pain score 4-6).   promethazine 12.5 MG tablet Commonly known as:  PHENERGAN Take 1-2 tablets (12.5-25 mg total) by mouth every 6 (six) hours as needed for nausea or vomiting.   rosuvastatin 10 MG tablet Commonly known as:  CRESTOR Take 10 mg by mouth daily.   tiZANidine 4 MG tablet Commonly known as:  ZANAFLEX Take 1 tablet (4 mg total) by mouth every 6 (six) hours as needed.   traMADol 50 MG tablet Commonly known as:  ULTRAM Take 50 mg by mouth every 6 (six) hours as needed for severe pain.   vitamin B-12 1000 MCG tablet Commonly known as:  CYANOCOBALAMIN Take 1,000 mcg by mouth daily.   Vitamin D3 125 MCG (5000 UT) Tabs Take 5,000 Units by mouth daily.            Durable Medical Equipment  (From admission, onward)         Start     Ordered   11/13/18 1740  DME Walker rolling  Once    Question:  Patient needs a walker to treat with the following condition  Answer:  Primary osteoarthritis of left knee   11/13/18 1741   11/13/18 1740  DME 3 n 1  Once     11/13/18 1741   11/13/18 1740  DME Bedside commode  Once    Question:  Patient needs a bedside commode to treat with the following condition  Answer:  Primary osteoarthritis of left knee   11/13/18 1741          Diagnostic Studies: Dg Chest 2 View  Result Date: 11/02/2018 CLINICAL DATA:  Preop left total  knee replacement EXAM: CHEST - 2 VIEW COMPARISON:  03/10/2016 FINDINGS: Heart and mediastinal contours are within normal limits. No focal opacities or effusions. No acute bony abnormality. IMPRESSION: No active cardiopulmonary disease. Electronically Signed   By: Rolm Baptise M.D.   On: 11/02/2018 18:29    Disposition: Discharge disposition: 01-Home or Self Care       Discharge Instructions    Call  MD / Call 911   Complete by:  As directed    If you experience chest pain or shortness of breath, CALL 911 and be transported to the hospital emergency room.  If you develope a fever above 101 F, pus (white drainage) or increased drainage or redness at the wound, or calf pain, call your surgeon's office.   Constipation Prevention   Complete by:  As directed    Drink plenty of fluids.  Prune juice may be helpful.  You may use a stool softener, such as Colace (over the counter) 100 mg twice a day.  Use MiraLax (over the counter) for constipation as needed.   Diet - low sodium heart healthy   Complete by:  As directed    Discharge instructions   Complete by:  As directed    INSTRUCTIONS AFTER JOINT REPLACEMENT   Remove items at home which could result in a fall. This includes throw rugs or furniture in walking pathways ICE to the affected joint every three hours while awake for 30 minutes at a time, for at least the first 3-5 days, and then as needed for pain and swelling.  Continue to use ice for pain and swelling. You may notice swelling that will progress down to the foot and ankle.  This is normal after surgery.  Elevate your leg when you are not up walking on it.   Continue to use the breathing machine you got in the hospital (incentive spirometer) which will help keep your temperature down.  It is common for your temperature to cycle up and down following surgery, especially at night when you are not up moving around and exerting yourself.  The breathing machine keeps your lungs expanded and  your temperature down.   DIET:  As you were doing prior to hospitalization, we recommend a well-balanced diet.  DRESSING / WOUND CARE / SHOWERING  You may shower 3 days after surgery, but keep the wounds dry during showering.  You may use an occlusive plastic wrap (Press'n Seal for example), NO SOAKING/SUBMERGING IN THE BATHTUB.  If the bandage gets wet, change with a clean dry gauze.  If the incision gets wet, pat the wound dry with a clean towel.  ACTIVITY  Increase activity slowly as tolerated, but follow the weight bearing instructions below.   No driving for 6 weeks or until further direction given by your physician.  You cannot drive while taking narcotics.  No lifting or carrying greater than 10 lbs. until further directed by your surgeon. Avoid periods of inactivity such as sitting longer than an hour when not asleep. This helps prevent blood clots.  You may return to work once you are authorized by your doctor.     WEIGHT BEARING   Weight bearing as tolerated with assist device (walker, cane, etc) as directed, use it as long as suggested by your surgeon or therapist, typically at least 4-6 weeks.   EXERCISES  Results after joint replacement surgery are often greatly improved when you follow the exercise, range of motion and muscle strengthening exercises prescribed by your doctor. Safety measures are also important to protect the joint from further injury. Any time any of these exercises cause you to have increased pain or swelling, decrease what you are doing until you are comfortable again and then slowly increase them. If you have problems or questions, call your caregiver or physical therapist for advice.   Rehabilitation is important following a joint replacement. After just a few days of  immobilization, the muscles of the leg can become weakened and shrink (atrophy).  These exercises are designed to build up the tone and strength of the thigh and leg muscles and to improve  motion. Often times heat used for twenty to thirty minutes before working out will loosen up your tissues and help with improving the range of motion but do not use heat for the first two weeks following surgery (sometimes heat can increase post-operative swelling).   These exercises can be done on a training (exercise) mat, on the floor, on a table or on a bed. Use whatever works the best and is most comfortable for you.    Use music or television while you are exercising so that the exercises are a pleasant break in your day. This will make your life better with the exercises acting as a break in your routine that you can look forward to.   Perform all exercises about fifteen times, three times per day or as directed.  You should exercise both the operative leg and the other leg as well.   Exercises include:   Quad Sets - Tighten up the muscle on the front of the thigh (Quad) and hold for 5-10 seconds.   Straight Leg Raises - With your knee straight (if you were given a brace, keep it on), lift the leg to 60 degrees, hold for 3 seconds, and slowly lower the leg.  Perform this exercise against resistance later as your leg gets stronger.  Leg Slides: Lying on your back, slowly slide your foot toward your buttocks, bending your knee up off the floor (only go as far as is comfortable). Then slowly slide your foot back down until your leg is flat on the floor again.  Angel Wings: Lying on your back spread your legs to the side as far apart as you can without causing discomfort.  Hamstring Strength:  Lying on your back, push your heel against the floor with your leg straight by tightening up the muscles of your buttocks.  Repeat, but this time bend your knee to a comfortable angle, and push your heel against the floor.  You may put a pillow under the heel to make it more comfortable if necessary.   A rehabilitation program following joint replacement surgery can speed recovery and prevent re-injury in the  future due to weakened muscles. Contact your doctor or a physical therapist for more information on knee rehabilitation.    CONSTIPATION  Constipation is defined medically as fewer than three stools per week and severe constipation as less than one stool per week.  Even if you have a regular bowel pattern at home, your normal regimen is likely to be disrupted due to multiple reasons following surgery.  Combination of anesthesia, postoperative narcotics, change in appetite and fluid intake all can affect your bowels.   YOU MUST use at least one of the following options; they are listed in order of increasing strength to get the job done.  They are all available over the counter, and you may need to use some, POSSIBLY even all of these options:    Drink plenty of fluids (prune juice may be helpful) and high fiber foods Colace 100 mg by mouth twice a day  Senokot for constipation as directed and as needed Dulcolax (bisacodyl), take with full glass of water  Miralax (polyethylene glycol) once or twice a day as needed.  If you have tried all these things and are unable to have a bowel movement  in the first 3-4 days after surgery call either your surgeon or your primary doctor.    If you experience loose stools or diarrhea, hold the medications until you stool forms back up.  If your symptoms do not get better within 1 week or if they get worse, check with your doctor.  If you experience "the worst abdominal pain ever" or develop nausea or vomiting, please contact the office immediately for further recommendations for treatment.   ITCHING:  If you experience itching with your medications, try taking only a single pain pill, or even half a pain pill at a time.  You can also use Benadryl over the counter for itching or also to help with sleep.   TED HOSE STOCKINGS:  Use stockings on both legs until for at least 2 weeks or as directed by physician office. They may be removed at night for  sleeping.  MEDICATIONS:  See your medication summary on the "After Visit Summary" that nursing will review with you.  You may have some home medications which will be placed on hold until you complete the course of blood thinner medication.  It is important for you to complete the blood thinner medication as prescribed.  PRECAUTIONS:  If you experience chest pain or shortness of breath - call 911 immediately for transfer to the hospital emergency department.   If you develop a fever greater that 101 F, purulent drainage from wound, increased redness or drainage from wound, foul odor from the wound/dressing, or calf pain - CONTACT YOUR SURGEON.                                                   FOLLOW-UP APPOINTMENTS:  If you do not already have a post-op appointment, please call the office for an appointment to be seen by your surgeon.  Guidelines for how soon to be seen are listed in your "After Visit Summary", but are typically between 1-4 weeks after surgery.  OTHER INSTRUCTIONS:   Knee Replacement:  Do not place pillow under knee, focus on keeping the knee straight while resting. CPM instructions: 0-90 degrees, 2 hours in the morning, 2 hours in the afternoon, and 2 hours in the evening. Place foam block, curve side up under heel at all times except when in CPM or when walking.  DO NOT modify, tear, cut, or change the foam block in any way.  MAKE SURE YOU:  Understand these instructions.  Get help right away if you are not doing well or get worse.    Thank you for letting us be a part of your medical care team.  It is a privilege we respect greatly.  We hope these instructions will help you stay on track for a fast and full recovery!   Increase activity slowly as tolerated   Complete by:  As directed       Follow-up Information    Melrose Nakayama, MD. Go on 11/23/2018.   Specialty:  Orthopedic Surgery Why:  Your appointment has been made for 10:30 am  Contact information: [place] [address] [phone]        [facility], [facility] Follow up.   Specialty:  Home Health Services Why:  HHPT will provide 5 HHPT visits prior to follow up with Dr. Rhona Raider.  Contact information: [address]  [place] [address] [phone]        Medicine, [facility]. Go on 11/22/2018.   Why:  You are scheduled to start Outpatient physical therapy at 11:00 am  Contact information: [place] [address] [phone]        Melrose Nakayama, MD. Schedule an appointment as soon as possible for a visit in 2 weeks.   Specialty:  Orthopedic Surgery Contact information: [place] [address] [phone]            Signed: [name] Veona Bittman 11/14/2018, 1:37 PM

## 2018-11-14 NOTE — Care Management Obs Status (Signed)
[facility] [facility] NOTIFICATION   Patient Details  Name: Kimberly Lowery MRN: [id number] Date of Birth: 1946-04-23   Medicare Observation Status Notification Given:  Yes    Carles Collet, RN 11/14/2018, 2:41 PM

## 2018-11-14 NOTE — Progress Notes (Signed)
Subjective: 1 Day Post-Op Procedure(s) (LRB): TOTAL KNEE ARTHROPLASTY (Left)   Patient is hoping to go home today but she wants to see how she does with physical therapy.  Activity level:  wbat Diet tolerance:  ok Voiding:  Foley out this morning Patient reports pain as mild.    Objective: Vital signs in last 24 hours: Temp:  [97.7 F (36.5 C)-98.6 F (37 C)] 98.2 F (36.8 C) (02/12 0521) Pulse Rate:  [64-91] 67 (02/12 0521) Resp:  [8-25] 15 (02/12 0521) BP: (117-158)/(50-126) 126/74 (02/12 0521) SpO2:  [88 %-100 %] 96 % (02/12 0521) Weight:  [83.1 kg] 83.1 kg (02/11 0849)  Labs: No results for input(s): HGB in the last 72 hours. No results for input(s): WBC, RBC, HCT, PLT in the last 72 hours. No results for input(s): NA, K, CL, CO2, BUN, CREATININE, GLUCOSE, CALCIUM in the last 72 hours. No results for input(s): LABPT, INR in the last 72 hours.  Physical Exam:  Neurologically intact ABD soft Neurovascular intact Sensation intact distally Intact pulses distally Dorsiflexion/Plantar flexion intact Incision: dressing C/D/I and no drainage No cellulitis present Compartment soft  Assessment/Plan:  1 Day Post-Op Procedure(s) (LRB): TOTAL KNEE ARTHROPLASTY (Left) Advance diet Up with therapy Discharge home with home health if doing well and cleared by PT. Continue on 325 mg of aspirin twice a day for DVT prevention. I will follow up with the patient at lunchtime to see how she is doing to see if she would like to go home.  Patient's anticipated LOS is less than 2 midnights, meeting these requirements: - Younger than 51 - Lives within 1 hour of care - Has a competent adult at home to recover with post-op recover - NO history of  - Chronic pain requiring opiods  - Diabetes  - Coronary Artery Disease  - Heart failure  - Heart attack  - Stroke  - DVT/VTE  - Cardiac arrhythmia  - Respiratory Failure/COPD  - Renal failure  - Anemia  - Advanced Liver  disease   Kimberly Lowery Kimberly Lowery 11/14/2018, 8:38 AM

## 2018-11-14 NOTE — Care Management (Signed)
Patient is ortho bundle, Home Health and DME arranged by office CM-Renee [place].

## 2018-11-15 DIAGNOSIS — J45909 Unspecified asthma, uncomplicated: Secondary | ICD-10-CM | POA: Diagnosis not present

## 2018-11-15 DIAGNOSIS — E7800 Pure hypercholesterolemia, unspecified: Secondary | ICD-10-CM | POA: Diagnosis not present

## 2018-11-15 DIAGNOSIS — Z471 Aftercare following joint replacement surgery: Secondary | ICD-10-CM | POA: Diagnosis not present

## 2018-11-15 DIAGNOSIS — M545 Low back pain: Secondary | ICD-10-CM | POA: Diagnosis not present

## 2018-11-15 DIAGNOSIS — E669 Obesity, unspecified: Secondary | ICD-10-CM | POA: Diagnosis not present

## 2018-11-15 DIAGNOSIS — G8929 Other chronic pain: Secondary | ICD-10-CM | POA: Diagnosis not present

## 2018-11-15 DIAGNOSIS — R69 Illness, unspecified: Secondary | ICD-10-CM | POA: Diagnosis not present

## 2018-11-15 DIAGNOSIS — Z96652 Presence of left artificial knee joint: Secondary | ICD-10-CM | POA: Diagnosis not present

## 2018-11-15 DIAGNOSIS — M1990 Unspecified osteoarthritis, unspecified site: Secondary | ICD-10-CM | POA: Diagnosis not present

## 2018-11-15 DIAGNOSIS — Z6829 Body mass index (BMI) 29.0-29.9, adult: Secondary | ICD-10-CM | POA: Diagnosis not present

## 2018-11-16 DIAGNOSIS — Z6829 Body mass index (BMI) 29.0-29.9, adult: Secondary | ICD-10-CM | POA: Diagnosis not present

## 2018-11-16 DIAGNOSIS — M1990 Unspecified osteoarthritis, unspecified site: Secondary | ICD-10-CM | POA: Diagnosis not present

## 2018-11-16 DIAGNOSIS — Z471 Aftercare following joint replacement surgery: Secondary | ICD-10-CM | POA: Diagnosis not present

## 2018-11-16 DIAGNOSIS — Z96652 Presence of left artificial knee joint: Secondary | ICD-10-CM | POA: Diagnosis not present

## 2018-11-16 DIAGNOSIS — M545 Low back pain: Secondary | ICD-10-CM | POA: Diagnosis not present

## 2018-11-16 DIAGNOSIS — R69 Illness, unspecified: Secondary | ICD-10-CM | POA: Diagnosis not present

## 2018-11-16 DIAGNOSIS — E669 Obesity, unspecified: Secondary | ICD-10-CM | POA: Diagnosis not present

## 2018-11-16 DIAGNOSIS — E7800 Pure hypercholesterolemia, unspecified: Secondary | ICD-10-CM | POA: Diagnosis not present

## 2018-11-16 DIAGNOSIS — J45909 Unspecified asthma, uncomplicated: Secondary | ICD-10-CM | POA: Diagnosis not present

## 2018-11-16 DIAGNOSIS — G8929 Other chronic pain: Secondary | ICD-10-CM | POA: Diagnosis not present

## 2018-11-19 DIAGNOSIS — M1990 Unspecified osteoarthritis, unspecified site: Secondary | ICD-10-CM | POA: Diagnosis not present

## 2018-11-19 DIAGNOSIS — R69 Illness, unspecified: Secondary | ICD-10-CM | POA: Diagnosis not present

## 2018-11-19 DIAGNOSIS — Z6829 Body mass index (BMI) 29.0-29.9, adult: Secondary | ICD-10-CM | POA: Diagnosis not present

## 2018-11-19 DIAGNOSIS — M545 Low back pain: Secondary | ICD-10-CM | POA: Diagnosis not present

## 2018-11-19 DIAGNOSIS — Z96652 Presence of left artificial knee joint: Secondary | ICD-10-CM | POA: Diagnosis not present

## 2018-11-19 DIAGNOSIS — J45909 Unspecified asthma, uncomplicated: Secondary | ICD-10-CM | POA: Diagnosis not present

## 2018-11-19 DIAGNOSIS — E669 Obesity, unspecified: Secondary | ICD-10-CM | POA: Diagnosis not present

## 2018-11-19 DIAGNOSIS — Z471 Aftercare following joint replacement surgery: Secondary | ICD-10-CM | POA: Diagnosis not present

## 2018-11-19 DIAGNOSIS — G8929 Other chronic pain: Secondary | ICD-10-CM | POA: Diagnosis not present

## 2018-11-19 DIAGNOSIS — E7800 Pure hypercholesterolemia, unspecified: Secondary | ICD-10-CM | POA: Diagnosis not present

## 2018-11-21 DIAGNOSIS — Z96652 Presence of left artificial knee joint: Secondary | ICD-10-CM | POA: Diagnosis not present

## 2018-11-21 DIAGNOSIS — E7800 Pure hypercholesterolemia, unspecified: Secondary | ICD-10-CM | POA: Diagnosis not present

## 2018-11-21 DIAGNOSIS — M1990 Unspecified osteoarthritis, unspecified site: Secondary | ICD-10-CM | POA: Diagnosis not present

## 2018-11-21 DIAGNOSIS — R69 Illness, unspecified: Secondary | ICD-10-CM | POA: Diagnosis not present

## 2018-11-21 DIAGNOSIS — Z471 Aftercare following joint replacement surgery: Secondary | ICD-10-CM | POA: Diagnosis not present

## 2018-11-21 DIAGNOSIS — M545 Low back pain: Secondary | ICD-10-CM | POA: Diagnosis not present

## 2018-11-21 DIAGNOSIS — E669 Obesity, unspecified: Secondary | ICD-10-CM | POA: Diagnosis not present

## 2018-11-21 DIAGNOSIS — G8929 Other chronic pain: Secondary | ICD-10-CM | POA: Diagnosis not present

## 2018-11-21 DIAGNOSIS — Z6829 Body mass index (BMI) 29.0-29.9, adult: Secondary | ICD-10-CM | POA: Diagnosis not present

## 2018-11-21 DIAGNOSIS — J45909 Unspecified asthma, uncomplicated: Secondary | ICD-10-CM | POA: Diagnosis not present

## 2018-11-22 DIAGNOSIS — M1712 Unilateral primary osteoarthritis, left knee: Secondary | ICD-10-CM | POA: Diagnosis not present

## 2018-11-23 DIAGNOSIS — Z96652 Presence of left artificial knee joint: Secondary | ICD-10-CM | POA: Diagnosis not present

## 2018-11-23 DIAGNOSIS — Z471 Aftercare following joint replacement surgery: Secondary | ICD-10-CM | POA: Diagnosis not present

## 2018-11-27 DIAGNOSIS — M1712 Unilateral primary osteoarthritis, left knee: Secondary | ICD-10-CM | POA: Diagnosis not present

## 2018-11-29 DIAGNOSIS — M1712 Unilateral primary osteoarthritis, left knee: Secondary | ICD-10-CM | POA: Diagnosis not present

## 2018-12-03 DIAGNOSIS — M1712 Unilateral primary osteoarthritis, left knee: Secondary | ICD-10-CM | POA: Diagnosis not present

## 2018-12-05 DIAGNOSIS — M1712 Unilateral primary osteoarthritis, left knee: Secondary | ICD-10-CM | POA: Diagnosis not present

## 2018-12-10 DIAGNOSIS — M1712 Unilateral primary osteoarthritis, left knee: Secondary | ICD-10-CM | POA: Diagnosis not present

## 2018-12-12 DIAGNOSIS — M1712 Unilateral primary osteoarthritis, left knee: Secondary | ICD-10-CM | POA: Diagnosis not present

## 2018-12-14 DIAGNOSIS — M25562 Pain in left knee: Secondary | ICD-10-CM | POA: Diagnosis not present

## 2018-12-17 DIAGNOSIS — M1712 Unilateral primary osteoarthritis, left knee: Secondary | ICD-10-CM | POA: Diagnosis not present

## 2018-12-19 DIAGNOSIS — M1712 Unilateral primary osteoarthritis, left knee: Secondary | ICD-10-CM | POA: Diagnosis not present

## 2018-12-25 DIAGNOSIS — M1712 Unilateral primary osteoarthritis, left knee: Secondary | ICD-10-CM | POA: Diagnosis not present

## 2018-12-27 DIAGNOSIS — M1712 Unilateral primary osteoarthritis, left knee: Secondary | ICD-10-CM | POA: Diagnosis not present

## 2019-01-01 DIAGNOSIS — M1712 Unilateral primary osteoarthritis, left knee: Secondary | ICD-10-CM | POA: Diagnosis not present

## 2019-01-03 DIAGNOSIS — M1712 Unilateral primary osteoarthritis, left knee: Secondary | ICD-10-CM | POA: Diagnosis not present

## 2019-01-08 DIAGNOSIS — M1712 Unilateral primary osteoarthritis, left knee: Secondary | ICD-10-CM | POA: Diagnosis not present

## 2019-01-10 DIAGNOSIS — M1712 Unilateral primary osteoarthritis, left knee: Secondary | ICD-10-CM | POA: Diagnosis not present

## 2019-01-14 DIAGNOSIS — M1712 Unilateral primary osteoarthritis, left knee: Secondary | ICD-10-CM | POA: Diagnosis not present

## 2019-01-16 DIAGNOSIS — M1712 Unilateral primary osteoarthritis, left knee: Secondary | ICD-10-CM | POA: Diagnosis not present

## 2019-01-22 DIAGNOSIS — M1712 Unilateral primary osteoarthritis, left knee: Secondary | ICD-10-CM | POA: Diagnosis not present

## 2019-01-23 DIAGNOSIS — K5730 Diverticulosis of large intestine without perforation or abscess without bleeding: Secondary | ICD-10-CM | POA: Diagnosis not present

## 2019-01-23 DIAGNOSIS — E559 Vitamin D deficiency, unspecified: Secondary | ICD-10-CM | POA: Diagnosis not present

## 2019-01-23 DIAGNOSIS — R69 Illness, unspecified: Secondary | ICD-10-CM | POA: Diagnosis not present

## 2019-01-23 DIAGNOSIS — Z0000 Encounter for general adult medical examination without abnormal findings: Secondary | ICD-10-CM | POA: Diagnosis not present

## 2019-01-23 DIAGNOSIS — J309 Allergic rhinitis, unspecified: Secondary | ICD-10-CM | POA: Diagnosis not present

## 2019-01-23 DIAGNOSIS — M25562 Pain in left knee: Secondary | ICD-10-CM | POA: Diagnosis not present

## 2019-01-23 DIAGNOSIS — Z79899 Other long term (current) drug therapy: Secondary | ICD-10-CM | POA: Diagnosis not present

## 2019-01-23 DIAGNOSIS — I6522 Occlusion and stenosis of left carotid artery: Secondary | ICD-10-CM | POA: Diagnosis not present

## 2019-01-23 DIAGNOSIS — K219 Gastro-esophageal reflux disease without esophagitis: Secondary | ICD-10-CM | POA: Diagnosis not present

## 2019-01-23 DIAGNOSIS — E785 Hyperlipidemia, unspecified: Secondary | ICD-10-CM | POA: Diagnosis not present

## 2019-01-24 DIAGNOSIS — M1712 Unilateral primary osteoarthritis, left knee: Secondary | ICD-10-CM | POA: Diagnosis not present

## 2019-01-29 DIAGNOSIS — M1712 Unilateral primary osteoarthritis, left knee: Secondary | ICD-10-CM | POA: Diagnosis not present

## 2019-01-30 DIAGNOSIS — M25562 Pain in left knee: Secondary | ICD-10-CM | POA: Diagnosis not present

## 2019-01-31 DIAGNOSIS — M1712 Unilateral primary osteoarthritis, left knee: Secondary | ICD-10-CM | POA: Diagnosis not present

## 2019-02-05 DIAGNOSIS — M1712 Unilateral primary osteoarthritis, left knee: Secondary | ICD-10-CM | POA: Diagnosis not present

## 2019-02-07 DIAGNOSIS — M1712 Unilateral primary osteoarthritis, left knee: Secondary | ICD-10-CM | POA: Diagnosis not present

## 2019-02-12 DIAGNOSIS — M1712 Unilateral primary osteoarthritis, left knee: Secondary | ICD-10-CM | POA: Diagnosis not present

## 2019-02-14 DIAGNOSIS — M1712 Unilateral primary osteoarthritis, left knee: Secondary | ICD-10-CM | POA: Diagnosis not present

## 2019-02-18 DIAGNOSIS — R69 Illness, unspecified: Secondary | ICD-10-CM | POA: Diagnosis not present

## 2019-02-19 DIAGNOSIS — M1712 Unilateral primary osteoarthritis, left knee: Secondary | ICD-10-CM | POA: Diagnosis not present

## 2019-02-20 DIAGNOSIS — E785 Hyperlipidemia, unspecified: Secondary | ICD-10-CM | POA: Diagnosis not present

## 2019-02-20 DIAGNOSIS — E559 Vitamin D deficiency, unspecified: Secondary | ICD-10-CM | POA: Diagnosis not present

## 2019-02-20 DIAGNOSIS — M6289 Other specified disorders of muscle: Secondary | ICD-10-CM | POA: Diagnosis not present

## 2019-02-20 DIAGNOSIS — E538 Deficiency of other specified B group vitamins: Secondary | ICD-10-CM | POA: Diagnosis not present

## 2019-02-20 DIAGNOSIS — R69 Illness, unspecified: Secondary | ICD-10-CM | POA: Diagnosis not present

## 2019-02-22 DIAGNOSIS — M1712 Unilateral primary osteoarthritis, left knee: Secondary | ICD-10-CM | POA: Diagnosis not present

## 2019-02-26 ENCOUNTER — Other Ambulatory Visit: Payer: Self-pay | Admitting: Internal Medicine

## 2019-02-26 DIAGNOSIS — I6522 Occlusion and stenosis of left carotid artery: Secondary | ICD-10-CM

## 2019-02-26 DIAGNOSIS — Z1231 Encounter for screening mammogram for malignant neoplasm of breast: Secondary | ICD-10-CM

## 2019-02-27 ENCOUNTER — Ambulatory Visit
Admission: RE | Admit: 2019-02-27 | Discharge: 2019-02-27 | Disposition: A | Discharge status: Home / Self Care | Payer: Medicare HMO | Source: Ambulatory Visit | Attending: Internal Medicine | Admitting: Internal Medicine

## 2019-02-27 ENCOUNTER — Other Ambulatory Visit: Payer: Self-pay

## 2019-02-27 DIAGNOSIS — Z1231 Encounter for screening mammogram for malignant neoplasm of breast: Secondary | ICD-10-CM | POA: Diagnosis not present

## 2019-02-27 DIAGNOSIS — M1712 Unilateral primary osteoarthritis, left knee: Secondary | ICD-10-CM | POA: Diagnosis not present

## 2019-03-05 DIAGNOSIS — M1712 Unilateral primary osteoarthritis, left knee: Secondary | ICD-10-CM | POA: Diagnosis not present

## 2019-03-07 DIAGNOSIS — M1712 Unilateral primary osteoarthritis, left knee: Secondary | ICD-10-CM | POA: Diagnosis not present

## 2019-03-11 DIAGNOSIS — M25562 Pain in left knee: Secondary | ICD-10-CM | POA: Diagnosis not present

## 2019-03-28 DIAGNOSIS — Z86018 Personal history of other benign neoplasm: Secondary | ICD-10-CM | POA: Diagnosis not present

## 2019-03-28 DIAGNOSIS — D225 Melanocytic nevi of trunk: Secondary | ICD-10-CM | POA: Diagnosis not present

## 2019-03-28 DIAGNOSIS — L821 Other seborrheic keratosis: Secondary | ICD-10-CM | POA: Diagnosis not present

## 2019-03-28 DIAGNOSIS — L219 Seborrheic dermatitis, unspecified: Secondary | ICD-10-CM | POA: Diagnosis not present

## 2019-03-28 DIAGNOSIS — Z85828 Personal history of other malignant neoplasm of skin: Secondary | ICD-10-CM | POA: Diagnosis not present

## 2019-03-28 DIAGNOSIS — L304 Erythema intertrigo: Secondary | ICD-10-CM | POA: Diagnosis not present

## 2019-03-28 DIAGNOSIS — L570 Actinic keratosis: Secondary | ICD-10-CM | POA: Diagnosis not present

## 2019-03-29 ENCOUNTER — Ambulatory Visit: Payer: Medicare HMO

## 2019-03-29 ENCOUNTER — Other Ambulatory Visit: Payer: Self-pay

## 2019-03-29 DIAGNOSIS — I6522 Occlusion and stenosis of left carotid artery: Secondary | ICD-10-CM | POA: Diagnosis not present

## 2019-05-08 ENCOUNTER — Encounter: Payer: Self-pay | Admitting: Cardiology

## 2019-05-08 ENCOUNTER — Ambulatory Visit (INDEPENDENT_AMBULATORY_CARE_PROVIDER_SITE_OTHER): Payer: Medicare HMO | Admitting: Cardiology

## 2019-05-08 ENCOUNTER — Other Ambulatory Visit: Payer: Self-pay

## 2019-05-08 VITALS — BP 143/84 | HR 70 | Ht 66.0 in | Wt 177.0 lb

## 2019-05-08 DIAGNOSIS — E785 Hyperlipidemia, unspecified: Secondary | ICD-10-CM | POA: Insufficient documentation

## 2019-05-08 DIAGNOSIS — I6523 Occlusion and stenosis of bilateral carotid arteries: Secondary | ICD-10-CM

## 2019-05-08 DIAGNOSIS — R030 Elevated blood-pressure reading, without diagnosis of hypertension: Secondary | ICD-10-CM | POA: Diagnosis not present

## 2019-05-08 DIAGNOSIS — R0989 Other specified symptoms and signs involving the circulatory and respiratory systems: Secondary | ICD-10-CM

## 2019-05-08 DIAGNOSIS — E782 Mixed hyperlipidemia: Secondary | ICD-10-CM | POA: Diagnosis not present

## 2019-05-08 HISTORY — DX: Hyperlipidemia, unspecified: E78.5

## 2019-05-08 MED ORDER — RAMIPRIL 5 MG PO CAPS: 5.0000 mg | ORAL_CAPSULE | Freq: Every evening | ORAL | 2 refills | Status: DC

## 2019-05-08 NOTE — Progress Notes (Signed)
Primary Physician/Referring:  Josetta Huddle, MD  Patient ID: Kimberly Lowery, female    DOB: August 04, 1946, 73 y.o.   MRN: [id number]  Chief Complaint  Patient presents with  . Carotid  . New Patient (Initial Visit)   HPI:    HPI: Kimberly Lowery  is a 73 y.o. Patient with known bilateral carotid artery disease, mild and asymptomatic, recently had repeat carotid artery duplex revealing progression of carotid disease.  Hence she was referred to me for further cardiac risk stratification.  Patient has multiple arthritis, her latest surgery was left knee replacement in February 2020.  She has recuperated well.  She has not had any cardiac complications during the procedure.  She denies shortness of breath or chest pain.  She denies any symptoms of claudication or TIA.  Her past medical history significant for hyperlipidemia.  Past Medical History:  Diagnosis Date  . Anxiety   . Arthritis    "some in my back; was in my hips" (03/22/2016)  . Asthma   . Chronic lower back pain   . Depression   . Family history of anesthesia complication    "my aunt didn't go out during OR"  . Fibrocystic breast disease   . H/O hiatal hernia   . High cholesterol   . HLD (hyperlipidemia) 05/08/2019  . IBS (irritable bowel syndrome)   . Obesity   . PONV (postoperative nausea and vomiting)    bad after last hip replacement  . Sleep apnea    "never RX mask" (03/22/2016)   Past Surgical History:  Procedure Laterality Date  . BACK SURGERY    . BILATERAL TEMPOROMANDIBULAR JOINT ARTHROPLASTY Bilateral 2002  . BREAST CYST ASPIRATION Left 1980's  . COLONOSCOPY W/ POLYPECTOMY  ~ 2007  . DEBRIDEMENT TENNIS ELBOW Right 1990  . DILATION AND CURETTAGE OF UTERUS  1980's X 2  . EYE SURGERY Bilateral 1974   "Pinguecula"  . FINGER SURGERY Right    index cyst removed  . JOINT REPLACEMENT    . KNEE ARTHROSCOPY WITH SUBCHONDROPLASTY Left 05/08/2018   Procedure: KNEE ARTHROSCOPY WITH SUBCHONDROPLASTY;  Surgeon:  Melrose Nakayama, MD;  Location: [place];  Service: Orthopedics;  Laterality: Left;  . LAPAROSCOPIC CHOLECYSTECTOMY  1999  . [place] SURGERY  01/2009  . LUMBAR FUSION  08/2009  . LUMBAR LAMINECTOMY  1981  . MASS EXCISION Left 04/10/2017   Procedure: EXCISION OF LEFT INDEX FINGER CYST;  Surgeon: Milly Jakob, MD;  Location: [place];  Service: Orthopedics;  Laterality: Left;  . TONSILLECTOMY  1983  . TOTAL ABDOMINAL HYSTERECTOMY  1996  . TOTAL HIP ARTHROPLASTY Right 03/18/2014   Procedure: RIGHT TOTAL HIP ARTHROPLASTY ANTERIOR APPROACH;  Surgeon: Hessie Dibble, MD;  Location: [place];  Service: Orthopedics;  Laterality: Right;  . TOTAL HIP ARTHROPLASTY Left 03/22/2016  . TOTAL HIP ARTHROPLASTY Left 03/22/2016   Procedure: TOTAL HIP ARTHROPLASTY ANTERIOR APPROACH;  Surgeon: Melrose Nakayama, MD;  Location: [place];  Service: Orthopedics;  Laterality: Left;  . TOTAL KNEE ARTHROPLASTY Left 11/13/2018   Procedure: TOTAL KNEE ARTHROPLASTY;  Surgeon: Melrose Nakayama, MD;  Location: [place];  Service: Orthopedics;  Laterality: Left;  . TUBAL LIGATION  1981   Social History   Socioeconomic History  . Marital status: Married    Spouse name: Not on file  . Number of children: 2  . Years of education: Not on file  . Highest education level: Not on file  Occupational History  . Not on file  Social Needs  . Financial resource strain: Not on file  . Food insecurity    Worry: Not on file    Inability: Not on file  . Transportation needs    Medical: Not on file    Non-medical: Not on file  Tobacco Use  . Smoking status: Never Smoker  . Smokeless tobacco: Never Used  Substance and Sexual Activity  . Alcohol use: Yes    Comment: socially  . Drug use: No  . Sexual activity: Yes  Lifestyle  . Physical activity    Days per week: Not on file    Minutes per session: Not on file  . Stress: Not on file  Relationships  . Social Herbalist on phone: Not on file    Gets  together: Not on file    Attends religious service: Not on file    Active member of club or organization: Not on file    Attends meetings of clubs or organizations: Not on file    Relationship status: Not on file  . Intimate partner violence    Fear of current or ex partner: Not on file    Emotionally abused: Not on file    Physically abused: Not on file    Forced sexual activity: Not on file  Other Topics Concern  . Not on file  Social History Narrative  . Not on file   ROS  Review of Systems  Constitution: Negative for chills, decreased appetite, malaise/fatigue and weight gain.  Cardiovascular: Negative for dyspnea on exertion, leg swelling and syncope.  Endocrine: Negative for cold intolerance.  Hematologic/Lymphatic: Does not bruise/bleed easily.  Musculoskeletal: Positive for back pain and joint pain.  Gastrointestinal: Negative for abdominal pain, anorexia, change in bowel habit, hematochezia and melena.  Neurological: Negative for headaches and light-headedness.  Psychiatric/Behavioral: Negative for depression and substance abuse.  All other systems reviewed and are negative.  Objective  Blood pressure (!) 143/84, pulse 70, height 5' 6"  (1.676 m), weight 177 lb (80.3 kg), SpO2 97 %. Body mass index is 28.57 kg/m.   Physical Exam  Constitutional: She appears well-developed and well-nourished. No distress.  HENT:  Head: Atraumatic.  Eyes: Conjunctivae are normal.  Neck: Neck supple. No JVD present. No thyromegaly present.  Cardiovascular: Normal rate, regular rhythm, normal heart sounds and intact distal pulses. Exam reveals no gallop.  No murmur heard. Pulses:      Carotid pulses are 2+ on the right side and 2+ on the left side with bruit.      Femoral pulses are 2+ on the right side with bruit and 2+ on the left side with bruit.      Popliteal pulses are 2+ on the right side and 2+ on the left side.       Dorsalis pedis pulses are 2+ on the right side and 2+ on the  left side.       Posterior tibial pulses are 1+ on the right side and 1+ on the left side.  No edema. Left leg mild varicose veins noted.  Pulmonary/Chest: Effort normal and breath sounds normal.  Abdominal: Soft. Bowel sounds are normal.  Musculoskeletal: Normal range of motion.  Neurological: She is alert.  Skin: Skin is warm and dry.  Psychiatric: She has a normal mood and affect.   Radiology: No results found.  Laboratory examination:   Labs 02/20/19/20: Serum glucose 101 mg, BUN 19, creatinine 0.73, eGFR greater than 61, potassium 5.1.  CMP otherwise normal.  B12 normal.  TSH normal.  Vitamin D 36.5.   Total cholesterol 181, triglycerides 180, HDL 58, LDL 87, non-HDL cholesterol 123.  CMP Latest Ref Rng & Units 11/02/2018 04/30/2018 03/10/2016  Glucose 70 - 99 mg/dL 110(H) 93 104(H)  BUN 8 - 23 mg/dL 12 7(L) 8  Creatinine 0.44 - 1.00 mg/dL 0.84 0.83 0.81  Sodium 135 - 145 mmol/L 142 142 140  Potassium 3.5 - 5.1 mmol/L 4.2 3.6 3.5  Chloride 98 - 111 mmol/L 106 107 106  CO2 22 - 32 mmol/L 27 29 25   Calcium 8.9 - 10.3 mg/dL 9.3 8.8(L) 9.1  Total Protein 6.5 - 8.1 g/dL - 6.7 -  Total Bilirubin 0.3 - 1.2 mg/dL - 0.7 -  Alkaline Phos 38 - 126 U/L - 58 -  AST 15 - 41 U/L - 16 -  ALT 0 - 44 U/L - 13 -   CBC Latest Ref Rng & Units 11/02/2018 04/30/2018 03/10/2016  WBC 4.0 - 10.5 K/uL 7.9 5.5 8.6  Hemoglobin 12.0 - 15.0 g/dL 13.4 13.1 13.2  Hematocrit 36.0 - 46.0 % 42.7 42.2 42.1  Platelets 150 - 400 K/uL 278 257 256   Lipid Panel  No results found for: CHOL, TRIG, HDL, CHOLHDL, VLDL, LDLCALC, LDLDIRECT HEMOGLOBIN A1C No results found for: HGBA1C, MPG TSH No results for input(s): TSH in the last 8760 hours. Medications   Current Outpatient Medications  Medication Instructions  . amoxicillin (AMOXIL) 2,000 mg, Oral, See admin instructions, Before dental procedures  . aspirin 325 mg, Oral, 2 times daily after meals  . Biotin 1000 MCG CHEW 1 each, Oral, Daily  . clindamycin  (CLEOCIN T) 1 % lotion 1 application, Topical, BH-each morning  . diclofenac sodium (VOLTAREN) 1 % GEL 1 application, Topical, 2 times daily PRN  . FLUoxetine (PROZAC) 20 mg, Daily  . ramipril (ALTACE) 5 mg, Oral, Every evening  . rosuvastatin (CRESTOR) 10 mg, Oral, Daily  . traMADol (ULTRAM) 50 mg, Oral, Every 6 hours PRN  . vitamin B-12 (CYANOCOBALAMIN) 1,000 mcg, Oral, Daily  . Vitamin D3 5,000 Units, Oral, Daily    Cardiac Studies:   Carotid artery duplex  03/29/2019: Stenosis in the left internal carotid artery (50-69%). No hemodynamically significant stenosis right ICA.  Antegrade right vertebral artery flow. Antegrade left vertebral artery flow. Compared to the study done on 01/17/2017, previously bilateral ICA stenosis of less than 50% was noted.  This represents a progression of extracranial cerebral vascular disease. Follow up in six months is appropriate if clinically indicated.  Assessment     ICD-10-CM   1. Bilateral carotid artery stenosis  I65.23 ramipril (ALTACE) 5 MG capsule  2. Mixed hyperlipidemia  E78.2 EKG 12-Lead  3. Femoral bruit  R09.89   4. Elevated BP without diagnosis of hypertension  R03.0 ramipril (ALTACE) 5 MG capsule    Basic metabolic panel    EKG 88/82/8003: Normal sinus rhythm at the rate of 63 bpm, normal axis.  No evidence of ischemia, normal EKG.  Recommendations:   Patient with bilateral carotid artery stenosis, remains asymptomatic.  She also has bilateral femoral artery bruit but vascular examination is otherwise essentially unremarkable except for bilateral PT mildly reduced.  She remains asymptomatic without claudication.  Nuclear stress test or cardiac stress test was not ordered as patient recently has had knee surgery In February of 20/20 without any periprocedural complications and she remains asymptomatic.  I also reviewed her labs, lipids are fairly well controlled except mildly elevated triglycerides which should improve  with weight  loss and continued exercise.  Aggressive secondary prevention is indicated in view of vascular disease, she is presently on aspirin and also statins which advised her to continue.  With regard to elevated blood pressure, although not severely elevated, in view of vascular disease and in view of "hope trial" I started her on Altase 5 mg in the evening.  I have discussed with her regarding angioneurotic edema and dry cough as side effects.  Will obtain a BMP in 10 days to 2 weeks and I would like to see her back in 6 weeks for follow-up and if he remains stable I'll continue to see her back either on a six-month to yearly basis and will continue carotid artery surveillance.  Adrian Prows, MD, [facility] 05/12/2019, 7:59 PM [facility]. [place] Pager: [phone] Office: [phone]  If no answer Cell [phone]

## 2019-06-12 DIAGNOSIS — R030 Elevated blood-pressure reading, without diagnosis of hypertension: Secondary | ICD-10-CM | POA: Diagnosis not present

## 2019-06-13 LAB — BASIC METABOLIC PANEL
BUN/Creatinine Ratio: 20 (ref 12–28)
BUN: 16 mg/dL (ref 8–27)
CO2: 26 mmol/L (ref 20–29)
Calcium: 9 mg/dL (ref 8.7–10.3)
Chloride: 105 mmol/L (ref 96–106)
Creatinine, Ser: 0.8 mg/dL (ref 0.57–1.00)
GFR calc Af Amer: 86 mL/min/{1.73_m2} (ref >59)
GFR calc non Af Amer: 74 mL/min/{1.73_m2} (ref >59)
Glucose: 103 mg/dL — ABNORMAL HIGH (ref 65–99)
Potassium: 5.1 mmol/L (ref 3.5–5.2)
Sodium: 143 mmol/L (ref 134–144)

## 2019-06-19 ENCOUNTER — Ambulatory Visit (INDEPENDENT_AMBULATORY_CARE_PROVIDER_SITE_OTHER): Payer: Medicare HMO | Admitting: Cardiology

## 2019-06-19 ENCOUNTER — Other Ambulatory Visit: Payer: Self-pay

## 2019-06-19 ENCOUNTER — Encounter: Payer: Self-pay | Admitting: Cardiology

## 2019-06-19 VITALS — BP 157/71 | HR 67 | Ht 66.0 in | Wt 176.0 lb

## 2019-06-19 DIAGNOSIS — T464X5A Adverse effect of angiotensin-converting-enzyme inhibitors, initial encounter: Secondary | ICD-10-CM

## 2019-06-19 DIAGNOSIS — I10 Essential (primary) hypertension: Secondary | ICD-10-CM | POA: Diagnosis not present

## 2019-06-19 DIAGNOSIS — I6522 Occlusion and stenosis of left carotid artery: Secondary | ICD-10-CM | POA: Diagnosis not present

## 2019-06-19 DIAGNOSIS — R05 Cough: Secondary | ICD-10-CM

## 2019-06-19 DIAGNOSIS — R058 Other specified cough: Secondary | ICD-10-CM

## 2019-06-19 MED ORDER — LOSARTAN POTASSIUM 50 MG PO TABS: 50.0000 mg | ORAL_TABLET | Freq: Every day | ORAL | 1 refills | Status: DC

## 2019-06-19 NOTE — Progress Notes (Signed)
Primary Physician/Referring:  Josetta Huddle, MD  Patient ID: Kimberly Lowery, female    DOB: Dec 22, 1945, 73 y.o.   MRN: [id number]  Chief Complaint  Patient presents with  . Hypertension    6 week f/u, pt c/o cough with Ramipril   HPI:    HPI: Kimberly Lowery  is a 73 y.o.  with known bilateral carotid artery disease, mild and asymptomatic, recently had repeat carotid artery duplex revealing progression of carotid disease, aggressive secondary prevention was recommended.    Patient has multiple arthritis, her latest surgery was left knee replacement in February 2020.  She has recuperated well.  She has not had any cardiac complications during the procedure. Due to elevated blood pressure, she was started on Altace at her last visit.  Since being on the medication, she has developed dry, hacking cough. She denies shortness of breath or chest pain.  She denies any symptoms of claudication or TIA.  Her past medical history significant for hyperlipidemia.  Past Medical History:  Diagnosis Date  . Anxiety   . Arthritis    "some in my back; was in my hips" (03/22/2016)  . Asthma   . Chronic lower back pain   . Depression   . Family history of anesthesia complication    "my aunt didn't go out during OR"  . Fibrocystic breast disease   . H/O hiatal hernia   . High cholesterol   . HLD (hyperlipidemia) 05/08/2019  . IBS (irritable bowel syndrome)   . Obesity   . PONV (postoperative nausea and vomiting)    bad after last hip replacement  . Sleep apnea    "never RX mask" (03/22/2016)   Past Surgical History:  Procedure Laterality Date  . BACK SURGERY    . BILATERAL TEMPOROMANDIBULAR JOINT ARTHROPLASTY Bilateral 2002  . BREAST CYST ASPIRATION Left 1980's  . COLONOSCOPY W/ POLYPECTOMY  ~ 2007  . DEBRIDEMENT TENNIS ELBOW Right 1990  . DILATION AND CURETTAGE OF UTERUS  1980's X 2  . EYE SURGERY Bilateral 1974   "Pinguecula"  . FINGER SURGERY Right    index cyst removed  . JOINT  REPLACEMENT    . KNEE ARTHROSCOPY WITH SUBCHONDROPLASTY Left 05/08/2018   Procedure: KNEE ARTHROSCOPY WITH SUBCHONDROPLASTY;  Surgeon: Melrose Nakayama, MD;  Location: [place];  Service: Orthopedics;  Laterality: Left;  . LAPAROSCOPIC CHOLECYSTECTOMY  1999  . [place] SURGERY  01/2009  . LUMBAR FUSION  08/2009  . LUMBAR LAMINECTOMY  1981  . MASS EXCISION Left 04/10/2017   Procedure: EXCISION OF LEFT INDEX FINGER CYST;  Surgeon: Milly Jakob, MD;  Location: [place];  Service: Orthopedics;  Laterality: Left;  . TONSILLECTOMY  1983  . TOTAL ABDOMINAL HYSTERECTOMY  1996  . TOTAL HIP ARTHROPLASTY Right 03/18/2014   Procedure: RIGHT TOTAL HIP ARTHROPLASTY ANTERIOR APPROACH;  Surgeon: Hessie Dibble, MD;  Location: [place];  Service: Orthopedics;  Laterality: Right;  . TOTAL HIP ARTHROPLASTY Left 03/22/2016  . TOTAL HIP ARTHROPLASTY Left 03/22/2016   Procedure: TOTAL HIP ARTHROPLASTY ANTERIOR APPROACH;  Surgeon: Melrose Nakayama, MD;  Location: [place];  Service: Orthopedics;  Laterality: Left;  . TOTAL KNEE ARTHROPLASTY Left 11/13/2018   Procedure: TOTAL KNEE ARTHROPLASTY;  Surgeon: Melrose Nakayama, MD;  Location: [place];  Service: Orthopedics;  Laterality: Left;  . TUBAL LIGATION  1981   Social History   Socioeconomic History  . Marital status: Married    Spouse name: Not on file  . Number of children: 2  .  Years of education: Not on file  . Highest education level: Not on file  Occupational History  . Not on file  Social Needs  . Financial resource strain: Not on file  . Food insecurity    Worry: Not on file    Inability: Not on file  . Transportation needs    Medical: Not on file    Non-medical: Not on file  Tobacco Use  . Smoking status: Never Smoker  . Smokeless tobacco: Never Used  Substance and Sexual Activity  . Alcohol use: Yes    Comment: socially  . Drug use: No  . Sexual activity: Yes  Lifestyle  . Physical activity    Days per week: Not on file     Minutes per session: Not on file  . Stress: Not on file  Relationships  . Social Herbalist on phone: Not on file    Gets together: Not on file    Attends religious service: Not on file    Active member of club or organization: Not on file    Attends meetings of clubs or organizations: Not on file    Relationship status: Not on file  . Intimate partner violence    Fear of current or ex partner: Not on file    Emotionally abused: Not on file    Physically abused: Not on file    Forced sexual activity: Not on file  Other Topics Concern  . Not on file  Social History Narrative  . Not on file   ROS  Review of Systems  Constitution: Negative for chills, decreased appetite, malaise/fatigue and weight gain.  Cardiovascular: Negative for chest pain, claudication, dyspnea on exertion, leg swelling, palpitations and syncope.  Respiratory: Positive for cough.   Endocrine: Negative for cold intolerance.  Hematologic/Lymphatic: Does not bruise/bleed easily.  Musculoskeletal: Positive for back pain and joint pain.  Gastrointestinal: Negative for abdominal pain, anorexia, change in bowel habit, hematochezia and melena.  Neurological: Negative for headaches and light-headedness.  Psychiatric/Behavioral: Negative for depression and substance abuse.  All other systems reviewed and are negative.  Objective  Blood pressure (!) 157/71, pulse 67, height 5' 6"  (1.676 m), weight 176 lb (79.8 kg), SpO2 96 %. Body mass index is 28.41 kg/m.   Physical Exam  Constitutional: She appears well-developed and well-nourished. No distress.  HENT:  Head: Atraumatic.  Eyes: Conjunctivae are normal.  Neck: Neck supple. No JVD present. No thyromegaly present.  Cardiovascular: Normal rate, regular rhythm, normal heart sounds and intact distal pulses. Exam reveals no gallop.  No murmur heard. Pulses:      Carotid pulses are 2+ on the right side and 2+ on the left side with bruit.      Femoral pulses  are 2+ on the right side with bruit and 2+ on the left side with bruit.      Popliteal pulses are 2+ on the right side and 2+ on the left side.       Dorsalis pedis pulses are 2+ on the right side and 2+ on the left side.       Posterior tibial pulses are 1+ on the right side and 1+ on the left side.  No edema. Left leg mild varicose veins noted.  Pulmonary/Chest: Effort normal and breath sounds normal.  Abdominal: Soft. Bowel sounds are normal.  Musculoskeletal: Normal range of motion.  Neurological: She is alert.  Skin: Skin is warm and dry.  Psychiatric: She has a normal mood and  affect.   Radiology: No results found.  Laboratory examination:   Labs 02/20/19/20: Serum glucose 101 mg, BUN 19, creatinine 0.73, eGFR greater than 61, potassium 5.1.  CMP otherwise normal.  B12 normal.  TSH normal.  Vitamin D 36.5.   Total cholesterol 181, triglycerides 180, HDL 58, LDL 87, non-HDL cholesterol 123.  CMP Latest Ref Rng & Units 06/12/2019 11/02/2018 04/30/2018  Glucose 65 - 99 mg/dL 103(H) 110(H) 93  BUN 8 - 27 mg/dL 16 12 7(L)  Creatinine 0.57 - 1.00 mg/dL 0.80 0.84 0.83  Sodium 134 - 144 mmol/L 143 142 142  Potassium 3.5 - 5.2 mmol/L 5.1 4.2 3.6  Chloride 96 - 106 mmol/L 105 106 107  CO2 20 - 29 mmol/L 26 27 29   Calcium 8.7 - 10.3 mg/dL 9.0 9.3 8.8(L)  Total Protein 6.5 - 8.1 g/dL - - 6.7  Total Bilirubin 0.3 - 1.2 mg/dL - - 0.7  Alkaline Phos 38 - 126 U/L - - 58  AST 15 - 41 U/L - - 16  ALT 0 - 44 U/L - - 13   CBC Latest Ref Rng & Units 11/02/2018 04/30/2018 03/10/2016  WBC 4.0 - 10.5 K/uL 7.9 5.5 8.6  Hemoglobin 12.0 - 15.0 g/dL 13.4 13.1 13.2  Hematocrit 36.0 - 46.0 % 42.7 42.2 42.1  Platelets 150 - 400 K/uL 278 257 256   Lipid Panel  No results found for: CHOL, TRIG, HDL, CHOLHDL, VLDL, LDLCALC, LDLDIRECT HEMOGLOBIN A1C No results found for: HGBA1C, MPG TSH No results for input(s): TSH in the last 8760 hours. Medications   Current Outpatient Medications  Medication  Instructions  . amoxicillin (AMOXIL) 2,000 mg, Oral, See admin instructions, Before dental procedures  . aspirin EC 81 mg, Oral, Daily  . Biotin 1000 MCG CHEW 1 each, Oral, Daily  . clindamycin (CLEOCIN T) 1 % lotion 1 application, Topical, BH-each morning  . diclofenac sodium (VOLTAREN) 1 % GEL 1 application, Topical, 2 times daily PRN  . doxycycline (DORYX) 100 mg, Oral, 2 times daily  . FLUoxetine (PROZAC) 20 mg, Daily  . losartan (COZAAR) 50 mg, Oral, Daily  . rosuvastatin (CRESTOR) 10 mg, Oral, Daily  . traMADol (ULTRAM) 50 mg, Oral, Every 6 hours PRN  . vitamin B-12 (CYANOCOBALAMIN) 1,000 mcg, Oral, Daily  . Vitamin D3 5,000 Units, Oral, Daily    Cardiac Studies:   Carotid artery duplex  03/29/2019: Stenosis in the left internal carotid artery (50-69%). No hemodynamically significant stenosis right ICA.  Antegrade right vertebral artery flow. Antegrade left vertebral artery flow. Compared to the study done on 01/17/2017, previously bilateral ICA stenosis of less than 50% was noted.  This represents a progression of extracranial cerebral vascular disease. Follow up in six months is appropriate if clinically indicated.  Assessment     ICD-10-CM   1. Essential hypertension  I10   2. ACE-inhibitor cough  R05    T46.4X5A   3. Left carotid stenosis  I65.22     EKG 05/08/2019: Normal sinus rhythm at the rate of 63 bpm, normal axis.  No evidence of ischemia, normal EKG.  Recommendations:   Patient is here for follow up on hypertension. She has developed ACE inhibitor cough with Altace, will change this to Losartan. Blood pressure also continues to be elevated. Kidney function has been stable. Encouraged her to start regular exercise to help with weight loss and controlling her blood pressure. Diet modifications were also discussed.   She will continue to need surveillance of left internal carotid stenosis,  will repeat carotid duplex in 6 months. Continue with aggressive secondary  prevention measures. I will see her back in 8 weeks for follow up on hypertension.    [name], MSN, APRN, FNP-C [facility]. [place] Office: [phone] Fax: [phone]

## 2019-06-21 ENCOUNTER — Encounter: Payer: Self-pay | Admitting: Cardiology

## 2019-07-01 DIAGNOSIS — R69 Illness, unspecified: Secondary | ICD-10-CM | POA: Diagnosis not present

## 2019-08-01 ENCOUNTER — Other Ambulatory Visit: Payer: Self-pay | Admitting: Cardiology

## 2019-08-01 DIAGNOSIS — R030 Elevated blood-pressure reading, without diagnosis of hypertension: Secondary | ICD-10-CM

## 2019-08-01 DIAGNOSIS — I6523 Occlusion and stenosis of bilateral carotid arteries: Secondary | ICD-10-CM

## 2019-08-14 ENCOUNTER — Ambulatory Visit: Payer: Medicare HMO | Admitting: Cardiology

## 2019-08-14 NOTE — Progress Notes (Deleted)
Primary Physician/Referring:  Josetta Huddle, MD  Patient ID: Kimberly Lowery, female    DOB: 10-28-1945, 73 y.o.   MRN: [id number]  No chief complaint on file.  HPI:    HPI: Kimberly Lowery  is a 73 y.o.  with known bilateral carotid artery disease, mild and asymptomatic, recently had repeat carotid artery duplex revealing progression of carotid disease, aggressive secondary prevention was recommended.    She developed cough with Altace, and it was changed to losartan at her last visit.  She now presents for hypertension follow-up.  She denies shortness of breath or chest pain.  She denies any symptoms of claudication or TIA.  Her past medical history significant for hyperlipidemia.  Past Medical History:  Diagnosis Date  . Anxiety   . Arthritis    "some in my back; was in my hips" (03/22/2016)  . Asthma   . Chronic lower back pain   . Depression   . Family history of anesthesia complication    "my aunt didn't go out during OR"  . Fibrocystic breast disease   . H/O hiatal hernia   . High cholesterol   . HLD (hyperlipidemia) 05/08/2019  . IBS (irritable bowel syndrome)   . Obesity   . PONV (postoperative nausea and vomiting)    bad after last hip replacement  . Sleep apnea    "never RX mask" (03/22/2016)   Past Surgical History:  Procedure Laterality Date  . BACK SURGERY    . BILATERAL TEMPOROMANDIBULAR JOINT ARTHROPLASTY Bilateral 2002  . BREAST CYST ASPIRATION Left 1980's  . COLONOSCOPY W/ POLYPECTOMY  ~ 2007  . DEBRIDEMENT TENNIS ELBOW Right 1990  . DILATION AND CURETTAGE OF UTERUS  1980's X 2  . EYE SURGERY Bilateral 1974   "Pinguecula"  . FINGER SURGERY Right    index cyst removed  . JOINT REPLACEMENT    . KNEE ARTHROSCOPY WITH SUBCHONDROPLASTY Left 05/08/2018   Procedure: KNEE ARTHROSCOPY WITH SUBCHONDROPLASTY;  Surgeon: Melrose Nakayama, MD;  Location: [facility];  Service: Orthopedics;  Laterality: Left;  . LAPAROSCOPIC CHOLECYSTECTOMY  1999  . [place]  SURGERY  01/2009  . LUMBAR FUSION  08/2009  . LUMBAR LAMINECTOMY  1981  . MASS EXCISION Left 04/10/2017   Procedure: EXCISION OF LEFT INDEX FINGER CYST;  Surgeon: Milly Jakob, MD;  Location: [facility];  Service: Orthopedics;  Laterality: Left;  . TONSILLECTOMY  1983  . TOTAL ABDOMINAL HYSTERECTOMY  1996  . TOTAL HIP ARTHROPLASTY Right 03/18/2014   Procedure: RIGHT TOTAL HIP ARTHROPLASTY ANTERIOR APPROACH;  Surgeon: Hessie Dibble, MD;  Location: [facility];  Service: Orthopedics;  Laterality: Right;  . TOTAL HIP ARTHROPLASTY Left 03/22/2016  . TOTAL HIP ARTHROPLASTY Left 03/22/2016   Procedure: TOTAL HIP ARTHROPLASTY ANTERIOR APPROACH;  Surgeon: Melrose Nakayama, MD;  Location: [facility];  Service: Orthopedics;  Laterality: Left;  . TOTAL KNEE ARTHROPLASTY Left 11/13/2018   Procedure: TOTAL KNEE ARTHROPLASTY;  Surgeon: Melrose Nakayama, MD;  Location: [facility];  Service: Orthopedics;  Laterality: Left;  . TUBAL LIGATION  1981   Social History   Socioeconomic History  . Marital status: Married    Spouse name: Not on file  . Number of children: 2  . Years of education: Not on file  . Highest education level: Not on file  Occupational History  . Not on file  Social Needs  . Financial resource strain: Not on file  . Food insecurity    Worry: Not on file  Inability: Not on file  . Transportation needs    Medical: Not on file    Non-medical: Not on file  Tobacco Use  . Smoking status: Never Smoker  . Smokeless tobacco: Never Used  Substance and Sexual Activity  . Alcohol use: Yes    Comment: socially  . Drug use: No  . Sexual activity: Yes  Lifestyle  . Physical activity    Days per week: Not on file    Minutes per session: Not on file  . Stress: Not on file  Relationships  . Social Herbalist on phone: Not on file    Gets together: Not on file    Attends religious service: Not on file    Active member of club or organization: Not on file    Attends meetings  of clubs or organizations: Not on file    Relationship status: Not on file  . Intimate partner violence    Fear of current or ex partner: Not on file    Emotionally abused: Not on file    Physically abused: Not on file    Forced sexual activity: Not on file  Other Topics Concern  . Not on file  Social History Narrative  . Not on file   ROS  Review of Systems  Constitution: Negative for chills, decreased appetite, malaise/fatigue and weight gain.  Cardiovascular: Negative for chest pain, claudication, dyspnea on exertion, leg swelling, palpitations and syncope.  Respiratory: Positive for cough.   Endocrine: Negative for cold intolerance.  Hematologic/Lymphatic: Does not bruise/bleed easily.  Musculoskeletal: Positive for back pain and joint pain.  Gastrointestinal: Negative for abdominal pain, anorexia, change in bowel habit, hematochezia and melena.  Neurological: Negative for headaches and light-headedness.  Psychiatric/Behavioral: Negative for depression and substance abuse.  All other systems reviewed and are negative.  Objective  There were no vitals taken for this visit. There is no height or weight on file to calculate BMI.   Physical Exam  Constitutional: She appears well-developed and well-nourished. No distress.  HENT:  Head: Atraumatic.  Eyes: Conjunctivae are normal.  Neck: Neck supple. No JVD present. No thyromegaly present.  Cardiovascular: Normal rate, regular rhythm, normal heart sounds and intact distal pulses. Exam reveals no gallop.  No murmur heard. Pulses:      Carotid pulses are 2+ on the right side and 2+ on the left side with bruit.      Femoral pulses are 2+ on the right side with bruit and 2+ on the left side with bruit.      Popliteal pulses are 2+ on the right side and 2+ on the left side.       Dorsalis pedis pulses are 2+ on the right side and 2+ on the left side.       Posterior tibial pulses are 1+ on the right side and 1+ on the left side.  No  edema. Left leg mild varicose veins noted.  Pulmonary/Chest: Effort normal and breath sounds normal.  Abdominal: Soft. Bowel sounds are normal.  Musculoskeletal: Normal range of motion.  Neurological: She is alert.  Skin: Skin is warm and dry.  Psychiatric: She has a normal mood and affect.   Radiology: No results found.  Laboratory examination:   Labs 02/20/19/20: Serum glucose 101 mg, BUN 19, creatinine 0.73, eGFR greater than 61, potassium 5.1.  CMP otherwise normal.  B12 normal.  TSH normal.  Vitamin D 36.5.   Total cholesterol 181, triglycerides 180, HDL 58, LDL 87,  non-HDL cholesterol 123.  CMP Latest Ref Rng & Units 06/12/2019 11/02/2018 04/30/2018  Glucose 65 - 99 mg/dL 103(H) 110(H) 93  BUN 8 - 27 mg/dL 16 12 7(L)  Creatinine 0.57 - 1.00 mg/dL 0.80 0.84 0.83  Sodium 134 - 144 mmol/L 143 142 142  Potassium 3.5 - 5.2 mmol/L 5.1 4.2 3.6  Chloride 96 - 106 mmol/L 105 106 107  CO2 20 - 29 mmol/L 26 27 29   Calcium 8.7 - 10.3 mg/dL 9.0 9.3 8.8(L)  Total Protein 6.5 - 8.1 g/dL - - 6.7  Total Bilirubin 0.3 - 1.2 mg/dL - - 0.7  Alkaline Phos 38 - 126 U/L - - 58  AST 15 - 41 U/L - - 16  ALT 0 - 44 U/L - - 13   CBC Latest Ref Rng & Units 11/02/2018 04/30/2018 03/10/2016  WBC 4.0 - 10.5 K/uL 7.9 5.5 8.6  Hemoglobin 12.0 - 15.0 g/dL 13.4 13.1 13.2  Hematocrit 36.0 - 46.0 % 42.7 42.2 42.1  Platelets 150 - 400 K/uL 278 257 256   Lipid Panel  No results found for: CHOL, TRIG, HDL, CHOLHDL, VLDL, LDLCALC, LDLDIRECT HEMOGLOBIN A1C No results found for: HGBA1C, MPG TSH No results for input(s): TSH in the last 8760 hours. Medications   Current Outpatient Medications  Medication Instructions  . amoxicillin (AMOXIL) 2,000 mg, Oral, See admin instructions, Before dental procedures  . aspirin EC 81 mg, Oral, Daily  . Biotin 1000 MCG CHEW 1 each, Oral, Daily  . clindamycin (CLEOCIN T) 1 % lotion 1 application, Topical, BH-each morning  . diclofenac sodium (VOLTAREN) 1 % GEL 1 application,  Topical, 2 times daily PRN  . doxycycline (DORYX) 100 mg, Oral, 2 times daily  . FLUoxetine (PROZAC) 20 mg, Daily  . losartan (COZAAR) 50 mg, Oral, Daily  . rosuvastatin (CRESTOR) 10 mg, Oral, Daily  . traMADol (ULTRAM) 50 mg, Oral, Every 6 hours PRN  . vitamin B-12 (CYANOCOBALAMIN) 1,000 mcg, Oral, Daily  . Vitamin D3 5,000 Units, Oral, Daily    Cardiac Studies:   Carotid artery duplex  03/29/2019: Stenosis in the left internal carotid artery (50-69%). No hemodynamically significant stenosis right ICA.  Antegrade right vertebral artery flow. Antegrade left vertebral artery flow. Compared to the study done on 01/17/2017, previously bilateral ICA stenosis of less than 50% was noted.  This represents a progression of extracranial cerebral vascular disease. Follow up in six months is appropriate if clinically indicated.  Assessment   No diagnosis found.  EKG 05/08/2019: Normal sinus rhythm at the rate of 63 bpm, normal axis.  No evidence of ischemia, normal EKG.  Recommendations:   Patient is here for follow up on hypertension. She has developed ACE inhibitor cough with Altace, will change this to Losartan. Blood pressure also continues to be elevated. Kidney function has been stable. Encouraged her to start regular exercise to help with weight loss and controlling her blood pressure. Diet modifications were also discussed.   She will continue to need surveillance of left internal carotid stenosis, will repeat carotid duplex in 6 months. Continue with aggressive secondary prevention measures. I will see her back in 8 weeks for follow up on hypertension.    [name], MSN, APRN, FNP-C [facility]. [place] Office: [phone] Fax: [phone]

## 2019-08-20 DIAGNOSIS — H25813 Combined forms of age-related cataract, bilateral: Secondary | ICD-10-CM | POA: Diagnosis not present

## 2019-08-20 DIAGNOSIS — H524 Presbyopia: Secondary | ICD-10-CM | POA: Diagnosis not present

## 2019-08-20 DIAGNOSIS — H02831 Dermatochalasis of right upper eyelid: Secondary | ICD-10-CM | POA: Diagnosis not present

## 2019-08-20 DIAGNOSIS — Z83518 Family history of other specified eye disorder: Secondary | ICD-10-CM | POA: Diagnosis not present

## 2019-08-21 ENCOUNTER — Other Ambulatory Visit: Payer: Self-pay

## 2019-08-21 ENCOUNTER — Encounter: Payer: Self-pay | Admitting: Cardiology

## 2019-08-21 ENCOUNTER — Ambulatory Visit: Payer: Medicare HMO | Admitting: Cardiology

## 2019-08-21 VITALS — BP 116/68 | HR 68 | Temp 97.2°F | Ht 67.0 in | Wt 175.0 lb

## 2019-08-21 DIAGNOSIS — I10 Essential (primary) hypertension: Secondary | ICD-10-CM | POA: Diagnosis not present

## 2019-08-21 DIAGNOSIS — I6522 Occlusion and stenosis of left carotid artery: Secondary | ICD-10-CM

## 2019-08-21 MED ORDER — LOSARTAN POTASSIUM 50 MG PO TABS: 50.0000 mg | ORAL_TABLET | Freq: Every day | ORAL | 3 refills | Status: DC

## 2019-08-21 NOTE — Progress Notes (Signed)
Primary Physician/Referring:  Josetta Huddle, MD  Patient ID: Kimberly Lowery, female    DOB: 1946/06/02, 73 y.o.   MRN: [id number]  Chief Complaint  Patient presents with  . Hypertension  . Follow-up    8 weeks   HPI:    HPI: Kimberly Lowery  is a 73 y.o.  with known bilateral carotid artery disease, mild and asymptomatic, recently had repeat carotid artery duplex revealing progression of carotid disease, aggressive secondary prevention was recommended.    She developed cough with Altace, and it was changed to losartan at her last visit.  She now presents for hypertension follow-up.  Cough has resolved. Blood pressure has been in the 130/70-80 range. She is tolerating Losartan well. She denies shortness of breath or chest pain.  She denies any symptoms of claudication or TIA.  Her past medical history significant for hyperlipidemia.  Past Medical History:  Diagnosis Date  . Anxiety   . Arthritis    "some in my back; was in my hips" (03/22/2016)  . Asthma   . Chronic lower back pain   . Depression   . Family history of anesthesia complication    "my aunt didn't go out during OR"  . Fibrocystic breast disease   . H/O hiatal hernia   . High cholesterol   . HLD (hyperlipidemia) 05/08/2019  . IBS (irritable bowel syndrome)   . Obesity   . PONV (postoperative nausea and vomiting)    bad after last hip replacement  . Sleep apnea    "never RX mask" (03/22/2016)   Past Surgical History:  Procedure Laterality Date  . BACK SURGERY    . BILATERAL TEMPOROMANDIBULAR JOINT ARTHROPLASTY Bilateral 2002  . BREAST CYST ASPIRATION Left 1980's  . COLONOSCOPY W/ POLYPECTOMY  ~ 2007  . DEBRIDEMENT TENNIS ELBOW Right 1990  . DILATION AND CURETTAGE OF UTERUS  1980's X 2  . EYE SURGERY Bilateral 1974   "Pinguecula"  . FINGER SURGERY Right    index cyst removed  . JOINT REPLACEMENT    . KNEE ARTHROSCOPY WITH SUBCHONDROPLASTY Left 05/08/2018   Procedure: KNEE ARTHROSCOPY WITH  SUBCHONDROPLASTY;  Surgeon: Melrose Nakayama, MD;  Location: [facility];  Service: Orthopedics;  Laterality: Left;  . LAPAROSCOPIC CHOLECYSTECTOMY  1999  . [place] SURGERY  01/2009  . LUMBAR FUSION  08/2009  . LUMBAR LAMINECTOMY  1981  . MASS EXCISION Left 04/10/2017   Procedure: EXCISION OF LEFT INDEX FINGER CYST;  Surgeon: Milly Jakob, MD;  Location: [facility];  Service: Orthopedics;  Laterality: Left;  . TONSILLECTOMY  1983  . TOTAL ABDOMINAL HYSTERECTOMY  1996  . TOTAL HIP ARTHROPLASTY Right 03/18/2014   Procedure: RIGHT TOTAL HIP ARTHROPLASTY ANTERIOR APPROACH;  Surgeon: Hessie Dibble, MD;  Location: [facility];  Service: Orthopedics;  Laterality: Right;  . TOTAL HIP ARTHROPLASTY Left 03/22/2016  . TOTAL HIP ARTHROPLASTY Left 03/22/2016   Procedure: TOTAL HIP ARTHROPLASTY ANTERIOR APPROACH;  Surgeon: Melrose Nakayama, MD;  Location: [facility];  Service: Orthopedics;  Laterality: Left;  . TOTAL KNEE ARTHROPLASTY Left 11/13/2018   Procedure: TOTAL KNEE ARTHROPLASTY;  Surgeon: Melrose Nakayama, MD;  Location: [facility] [place];  Service: Orthopedics;  Laterality: Left;  . TUBAL LIGATION  1981   Social History   Socioeconomic History  . Marital status: Married    Spouse name: Not on file  . Number of children: 2  . Years of education: Not on file  . Highest education level: Not on file  Occupational History  .  Not on file  Social Needs  . Financial resource strain: Not on file  . Food insecurity    Worry: Not on file    Inability: Not on file  . Transportation needs    Medical: Not on file    Non-medical: Not on file  Tobacco Use  . Smoking status: Never Smoker  . Smokeless tobacco: Never Used  Substance and Sexual Activity  . Alcohol use: Yes    Comment: socially  . Drug use: No  . Sexual activity: Yes  Lifestyle  . Physical activity    Days per week: Not on file    Minutes per session: Not on file  . Stress: Not on file  Relationships  . Social Herbalist on  phone: Not on file    Gets together: Not on file    Attends religious service: Not on file    Active member of club or organization: Not on file    Attends meetings of clubs or organizations: Not on file    Relationship status: Not on file  . Intimate partner violence    Fear of current or ex partner: Not on file    Emotionally abused: Not on file    Physically abused: Not on file    Forced sexual activity: Not on file  Other Topics Concern  . Not on file  Social History Narrative  . Not on file   ROS  Review of Systems  Constitution: Negative for chills, decreased appetite, malaise/fatigue and weight gain.  Cardiovascular: Negative for chest pain, claudication, dyspnea on exertion, leg swelling, palpitations and syncope.  Respiratory: Negative for cough.   Endocrine: Negative for cold intolerance.  Hematologic/Lymphatic: Does not bruise/bleed easily.  Musculoskeletal: Positive for back pain and joint pain.  Gastrointestinal: Negative for abdominal pain, anorexia, change in bowel habit, hematochezia and melena.  Neurological: Negative for headaches and light-headedness.  Psychiatric/Behavioral: Negative for depression and substance abuse.  All other systems reviewed and are negative.  Objective  Blood pressure 116/68, pulse 68, temperature (!) 97.2 F (36.2 C), height 5' 7"  (1.702 m), weight 175 lb (79.4 kg), SpO2 96 %. Body mass index is 27.41 kg/m.   Physical Exam  Constitutional: She appears well-developed and well-nourished. No distress.  HENT:  Head: Atraumatic.  Eyes: Conjunctivae are normal.  Neck: Neck supple. No JVD present. No thyromegaly present.  Cardiovascular: Normal rate, regular rhythm, normal heart sounds and intact distal pulses. Exam reveals no gallop.  No murmur heard. Pulses:      Carotid pulses are 2+ on the right side and 2+ on the left side with bruit.      Femoral pulses are 2+ on the right side with bruit and 2+ on the left side with bruit.       Popliteal pulses are 2+ on the right side and 2+ on the left side.       Dorsalis pedis pulses are 2+ on the right side and 2+ on the left side.       Posterior tibial pulses are 1+ on the right side and 1+ on the left side.  No edema. Left leg mild varicose veins noted.  Pulmonary/Chest: Effort normal and breath sounds normal.  Abdominal: Soft. Bowel sounds are normal.  Musculoskeletal: Normal range of motion.  Neurological: She is alert.  Skin: Skin is warm and dry.  Psychiatric: She has a normal mood and affect.   Radiology: No results found.  Laboratory examination:   Labs 02/20/19/20:  Serum glucose 101 mg, BUN 19, creatinine 0.73, eGFR greater than 61, potassium 5.1.  CMP otherwise normal.  B12 normal.  TSH normal.  Vitamin D 36.5.   Total cholesterol 181, triglycerides 180, HDL 58, LDL 87, non-HDL cholesterol 123.  CMP Latest Ref Rng & Units 06/12/2019 11/02/2018 04/30/2018  Glucose 65 - 99 mg/dL 103(H) 110(H) 93  BUN 8 - 27 mg/dL 16 12 7(L)  Creatinine 0.57 - 1.00 mg/dL 0.80 0.84 0.83  Sodium 134 - 144 mmol/L 143 142 142  Potassium 3.5 - 5.2 mmol/L 5.1 4.2 3.6  Chloride 96 - 106 mmol/L 105 106 107  CO2 20 - 29 mmol/L 26 27 29   Calcium 8.7 - 10.3 mg/dL 9.0 9.3 8.8(L)  Total Protein 6.5 - 8.1 g/dL - - 6.7  Total Bilirubin 0.3 - 1.2 mg/dL - - 0.7  Alkaline Phos 38 - 126 U/L - - 58  AST 15 - 41 U/L - - 16  ALT 0 - 44 U/L - - 13   CBC Latest Ref Rng & Units 11/02/2018 04/30/2018 03/10/2016  WBC 4.0 - 10.5 K/uL 7.9 5.5 8.6  Hemoglobin 12.0 - 15.0 g/dL 13.4 13.1 13.2  Hematocrit 36.0 - 46.0 % 42.7 42.2 42.1  Platelets 150 - 400 K/uL 278 257 256   Lipid Panel  No results found for: CHOL, TRIG, HDL, CHOLHDL, VLDL, LDLCALC, LDLDIRECT HEMOGLOBIN A1C No results found for: HGBA1C, MPG TSH No results for input(s): TSH in the last 8760 hours. Medications   Current Outpatient Medications  Medication Instructions  . amoxicillin (AMOXIL) 2,000 mg, Oral, See admin instructions, Before  dental procedures  . aspirin EC 81 mg, Oral, Daily  . Biotin 1000 MCG CHEW 1 each, Oral, Daily  . clindamycin (CLEOCIN T) 1 % lotion 1 application, Topical, BH-each morning  . diclofenac (VOLTAREN) 50 mg, Oral, 2 times daily PRN  . diclofenac sodium (VOLTAREN) 1 % GEL 1 application, Topical, 2 times daily PRN  . doxycycline (DORYX) 100 mg, Oral, 2 times daily  . FLUoxetine (PROZAC) 20 mg, Daily  . losartan (COZAAR) 50 mg, Oral, Daily  . omeprazole (PRILOSEC) 20 mg, Oral, Daily  . rosuvastatin (CRESTOR) 10 mg, Oral, Daily  . traMADol (ULTRAM) 50 mg, Oral, Every 6 hours PRN  . vitamin B-12 (CYANOCOBALAMIN) 1,000 mcg, Oral, Daily  . Vitamin D3 5,000 Units, Oral, Daily    Cardiac Studies:   Carotid artery duplex  03/29/2019: Stenosis in the left internal carotid artery (50-69%). No hemodynamically significant stenosis right ICA.  Antegrade right vertebral artery flow. Antegrade left vertebral artery flow. Compared to the study done on 01/17/2017, previously bilateral ICA stenosis of less than 50% was noted.  This represents a progression of extracranial cerebral vascular disease. Follow up in six months is appropriate if clinically indicated.  Assessment     ICD-10-CM   1. Essential hypertension  I10   2. Left carotid stenosis  I65.22     EKG 05/08/2019: Normal sinus rhythm at the rate of 63 bpm, normal axis.  No evidence of ischemia, normal EKG.  Recommendations:   Blood pressure has improved since being on losartan and she has had resolution of an ACE inhibitor cough since changing from mammogram.  Blood pressure at home has been in the 130/70-80 range.  We will continue the same.  Carotid duplex had recently shown slight progression of left carotid stenosis.  She will be due for carotid duplex for continued surveillance next month which will be scheduled today.  I will notify  her of these results.  She is traveling to [place] in January for several weeks.  I will plan to see her  back in 6 months, but encouraged her to contact me sooner if needed.   [name], MSN, APRN, FNP-C [facility]. [place] Office: [phone] Fax: [phone]

## 2019-08-22 DIAGNOSIS — R69 Illness, unspecified: Secondary | ICD-10-CM | POA: Diagnosis not present

## 2019-09-13 ENCOUNTER — Other Ambulatory Visit: Payer: Self-pay | Admitting: Cardiology

## 2019-09-13 DIAGNOSIS — I6523 Occlusion and stenosis of bilateral carotid arteries: Secondary | ICD-10-CM

## 2019-09-17 ENCOUNTER — Other Ambulatory Visit: Payer: Medicare HMO

## 2019-10-02 ENCOUNTER — Other Ambulatory Visit: Payer: Self-pay

## 2019-10-02 ENCOUNTER — Ambulatory Visit (INDEPENDENT_AMBULATORY_CARE_PROVIDER_SITE_OTHER): Payer: Medicare HMO

## 2019-10-02 DIAGNOSIS — I6523 Occlusion and stenosis of bilateral carotid arteries: Secondary | ICD-10-CM

## 2019-10-03 ENCOUNTER — Other Ambulatory Visit: Payer: Self-pay | Admitting: Cardiology

## 2019-10-03 DIAGNOSIS — I6522 Occlusion and stenosis of left carotid artery: Secondary | ICD-10-CM

## 2019-11-02 ENCOUNTER — Ambulatory Visit: Payer: Medicare HMO

## 2019-11-10 ENCOUNTER — Ambulatory Visit: Payer: Medicare HMO | Attending: Internal Medicine

## 2019-11-10 DIAGNOSIS — Z23 Encounter for immunization: Secondary | ICD-10-CM

## 2019-11-10 NOTE — Progress Notes (Signed)
   Covid-19 Vaccination Clinic  Name:  Kimberly Lowery    MRN: [id number] DOB: 1946/04/08  11/10/2019  [name] was observed post Covid-19 immunization for 15 minutes without incidence. She was provided with Vaccine Information Sheet and instruction to access the V-Safe system.   [name] was instructed to call 911 with any severe reactions post vaccine: Marland Kitchen Difficulty breathing  . Swelling of your face and throat  . A fast heartbeat  . A bad rash all over your body  . Dizziness and weakness    Immunizations Administered    Name Date Dose VIS Date Route   Pfizer COVID-19 Vaccine 11/10/2019  9:32 AM 0.3 mL 09/13/2019 Intramuscular   Manufacturer: [place]   Lot: [id number]   [place]: [id number]

## 2019-11-13 ENCOUNTER — Ambulatory Visit: Payer: Medicare HMO

## 2019-11-18 DIAGNOSIS — M7061 Trochanteric bursitis, right hip: Secondary | ICD-10-CM | POA: Diagnosis not present

## 2019-11-18 DIAGNOSIS — M25562 Pain in left knee: Secondary | ICD-10-CM | POA: Diagnosis not present

## 2019-11-18 DIAGNOSIS — Z96652 Presence of left artificial knee joint: Secondary | ICD-10-CM | POA: Diagnosis not present

## 2019-11-18 DIAGNOSIS — M7062 Trochanteric bursitis, left hip: Secondary | ICD-10-CM | POA: Diagnosis not present

## 2019-11-19 DIAGNOSIS — M6281 Muscle weakness (generalized): Secondary | ICD-10-CM | POA: Diagnosis not present

## 2019-11-19 DIAGNOSIS — M7062 Trochanteric bursitis, left hip: Secondary | ICD-10-CM | POA: Diagnosis not present

## 2019-11-19 DIAGNOSIS — M7061 Trochanteric bursitis, right hip: Secondary | ICD-10-CM | POA: Diagnosis not present

## 2019-11-20 DIAGNOSIS — M7061 Trochanteric bursitis, right hip: Secondary | ICD-10-CM | POA: Diagnosis not present

## 2019-11-20 DIAGNOSIS — M6281 Muscle weakness (generalized): Secondary | ICD-10-CM | POA: Diagnosis not present

## 2019-11-20 DIAGNOSIS — M7062 Trochanteric bursitis, left hip: Secondary | ICD-10-CM | POA: Diagnosis not present

## 2019-11-27 DIAGNOSIS — M6281 Muscle weakness (generalized): Secondary | ICD-10-CM | POA: Diagnosis not present

## 2019-11-27 DIAGNOSIS — M7061 Trochanteric bursitis, right hip: Secondary | ICD-10-CM | POA: Diagnosis not present

## 2019-11-27 DIAGNOSIS — M7062 Trochanteric bursitis, left hip: Secondary | ICD-10-CM | POA: Diagnosis not present

## 2019-11-29 DIAGNOSIS — M6281 Muscle weakness (generalized): Secondary | ICD-10-CM | POA: Diagnosis not present

## 2019-11-29 DIAGNOSIS — M7061 Trochanteric bursitis, right hip: Secondary | ICD-10-CM | POA: Diagnosis not present

## 2019-11-29 DIAGNOSIS — M7062 Trochanteric bursitis, left hip: Secondary | ICD-10-CM | POA: Diagnosis not present

## 2019-12-03 DIAGNOSIS — M7061 Trochanteric bursitis, right hip: Secondary | ICD-10-CM | POA: Diagnosis not present

## 2019-12-03 DIAGNOSIS — M6281 Muscle weakness (generalized): Secondary | ICD-10-CM | POA: Diagnosis not present

## 2019-12-03 DIAGNOSIS — M7062 Trochanteric bursitis, left hip: Secondary | ICD-10-CM | POA: Diagnosis not present

## 2019-12-04 ENCOUNTER — Ambulatory Visit: Payer: Medicare HMO | Attending: Internal Medicine

## 2019-12-04 DIAGNOSIS — Z23 Encounter for immunization: Secondary | ICD-10-CM | POA: Insufficient documentation

## 2019-12-04 NOTE — Progress Notes (Signed)
   Covid-19 Vaccination Clinic  Name:  Kimberly Lowery    MRN: [id number] DOB: [birth year]  12/04/2019  [name] was observed post Covid-19 immunization for 15 minutes without incident. She was provided with Vaccine Information Sheet and instruction to access the V-Safe system.   [name] was instructed to call 911 with any severe reactions post vaccine: Marland Kitchen Difficulty breathing  . Swelling of face and throat  . A fast heartbeat  . A bad rash all over body  . Dizziness and weakness   Immunizations Administered    Name Date Dose VIS Date Route   Pfizer COVID-19 Vaccine 12/04/2019  1:26 PM 0.3 mL 09/13/2019 Intramuscular   Manufacturer: [place]   Lot: [id number]   [place]: [id number]

## 2019-12-06 DIAGNOSIS — M25552 Pain in left hip: Secondary | ICD-10-CM | POA: Diagnosis not present

## 2019-12-06 DIAGNOSIS — M7061 Trochanteric bursitis, right hip: Secondary | ICD-10-CM | POA: Diagnosis not present

## 2019-12-06 DIAGNOSIS — M7062 Trochanteric bursitis, left hip: Secondary | ICD-10-CM | POA: Diagnosis not present

## 2019-12-06 DIAGNOSIS — M25551 Pain in right hip: Secondary | ICD-10-CM | POA: Diagnosis not present

## 2020-01-01 DIAGNOSIS — M25551 Pain in right hip: Secondary | ICD-10-CM | POA: Diagnosis not present

## 2020-01-01 DIAGNOSIS — M25552 Pain in left hip: Secondary | ICD-10-CM | POA: Diagnosis not present

## 2020-01-09 DIAGNOSIS — M545 Low back pain: Secondary | ICD-10-CM | POA: Diagnosis not present

## 2020-01-23 DIAGNOSIS — E785 Hyperlipidemia, unspecified: Secondary | ICD-10-CM | POA: Diagnosis not present

## 2020-01-23 DIAGNOSIS — M5127 Other intervertebral disc displacement, lumbosacral region: Secondary | ICD-10-CM | POA: Diagnosis not present

## 2020-01-23 DIAGNOSIS — K219 Gastro-esophageal reflux disease without esophagitis: Secondary | ICD-10-CM | POA: Diagnosis not present

## 2020-01-23 DIAGNOSIS — I6522 Occlusion and stenosis of left carotid artery: Secondary | ICD-10-CM | POA: Diagnosis not present

## 2020-01-23 DIAGNOSIS — M48061 Spinal stenosis, lumbar region without neurogenic claudication: Secondary | ICD-10-CM | POA: Diagnosis not present

## 2020-01-23 DIAGNOSIS — E538 Deficiency of other specified B group vitamins: Secondary | ICD-10-CM | POA: Diagnosis not present

## 2020-01-23 DIAGNOSIS — Z79899 Other long term (current) drug therapy: Secondary | ICD-10-CM | POA: Diagnosis not present

## 2020-01-23 DIAGNOSIS — Z86010 Personal history of colonic polyps: Secondary | ICD-10-CM | POA: Diagnosis not present

## 2020-01-23 DIAGNOSIS — M6289 Other specified disorders of muscle: Secondary | ICD-10-CM | POA: Diagnosis not present

## 2020-01-23 DIAGNOSIS — E559 Vitamin D deficiency, unspecified: Secondary | ICD-10-CM | POA: Diagnosis not present

## 2020-01-23 DIAGNOSIS — M545 Low back pain: Secondary | ICD-10-CM | POA: Diagnosis not present

## 2020-01-23 DIAGNOSIS — Z0000 Encounter for general adult medical examination without abnormal findings: Secondary | ICD-10-CM | POA: Diagnosis not present

## 2020-01-23 DIAGNOSIS — R69 Illness, unspecified: Secondary | ICD-10-CM | POA: Diagnosis not present

## 2020-01-23 DIAGNOSIS — K5730 Diverticulosis of large intestine without perforation or abscess without bleeding: Secondary | ICD-10-CM | POA: Diagnosis not present

## 2020-01-27 ENCOUNTER — Other Ambulatory Visit: Payer: Self-pay | Admitting: Internal Medicine

## 2020-01-27 DIAGNOSIS — Z1231 Encounter for screening mammogram for malignant neoplasm of breast: Secondary | ICD-10-CM

## 2020-02-12 DIAGNOSIS — R69 Illness, unspecified: Secondary | ICD-10-CM | POA: Diagnosis not present

## 2020-02-18 ENCOUNTER — Ambulatory Visit: Payer: Medicare HMO | Admitting: Cardiology

## 2020-03-03 ENCOUNTER — Ambulatory Visit
Admission: RE | Admit: 2020-03-03 | Discharge: 2020-03-03 | Disposition: A | Discharge status: Home / Self Care | Payer: Medicare HMO | Source: Ambulatory Visit | Attending: Internal Medicine | Admitting: Internal Medicine

## 2020-03-03 ENCOUNTER — Other Ambulatory Visit: Payer: Self-pay

## 2020-03-03 DIAGNOSIS — Z1231 Encounter for screening mammogram for malignant neoplasm of breast: Secondary | ICD-10-CM

## 2020-03-04 ENCOUNTER — Ambulatory Visit: Payer: Medicare HMO | Admitting: Cardiology

## 2020-03-04 ENCOUNTER — Encounter: Payer: Self-pay | Admitting: Cardiology

## 2020-03-04 ENCOUNTER — Other Ambulatory Visit: Payer: Self-pay

## 2020-03-04 VITALS — BP 144/80 | HR 63 | Ht 67.0 in | Wt 177.0 lb

## 2020-03-04 DIAGNOSIS — R69 Illness, unspecified: Secondary | ICD-10-CM | POA: Diagnosis not present

## 2020-03-04 DIAGNOSIS — E782 Mixed hyperlipidemia: Secondary | ICD-10-CM

## 2020-03-04 DIAGNOSIS — I6522 Occlusion and stenosis of left carotid artery: Secondary | ICD-10-CM | POA: Diagnosis not present

## 2020-03-04 DIAGNOSIS — I10 Essential (primary) hypertension: Secondary | ICD-10-CM

## 2020-03-04 NOTE — Progress Notes (Signed)
Kimberly Lowery Date of Birth: 10-Jun-1946 MRN: [id number] Primary Care Provider:Gates, [name], MD Former Cardiology Providers: Jeri Lager, APRN, FNP-C  Primary Cardiologist: Rex Kras, DO, [facility] (established care 03/04/2020)  Date: 03/04/20 Last Visit: 08/2019  Chief Complaint  Patient presents with  . Hypertension  . Follow-up    HPI  Kimberly Lowery is a 74 y.o.  female who presents to the office with a chief complaint of " hypertension and carotid disease follow-up." Patient's past medical history and cardiovascular risk factors include: left carotid artery disease, hypertension, hyperlipidemia, postmenopausal female, advanced age.   Patient has been following in the office originally with Jeri Lager, APRN for the management of hypertension and carotid artery disease.  However since I am seeing the patient for the first time as [name] no longer working with the practice.  Patient had a carotid duplex back in June 2020 was noted to have a left-sided carotid artery stenosis less than 70%.  She was asked to follow-up with another carotid duplex in 6 months around December 2020 but patient forgot to follow up.  Patient had a repeat lipid profile with her primary care provider in April 2021 results are noted below.  Her total cholesterol and LDL have improved but LDL continues to be greater than 70 mg/dL.   In regards to her blood pressure patient states that her home blood pressures are better controlled.  She does not check her blood pressures at home periodically but when she does she notes that her systolic blood pressures are usually around 671 mmHg and diastolic blood pressures are between 80 and 85 mmHg.   FUNCTIONAL STATUS: Patient states that she does not exercise for follow daily routine due to back pain and leg pain.  However if she were to walk with a shopping cart she can ambulate for hours.  ALLERGIES: Allergies  Allergen Reactions  . Shellfish Allergy Hives and  Nausea And Vomiting    SHRIMP LOBSTER  . Tape Other (See Comments)    causes blisters  . Codeine Nausea And Vomiting  . Dilaudid [Hydromorphone Hcl] Rash  . Florastor Kids [Saccharomyces Boulardii] Other (See Comments)    "SIGNIFICANT CONSTIPATION"  . Garlic Other (See Comments)    Stomach Upset  . Morphine Sulfate Nausea And Vomiting  . Nucynta [Tapentadol] Other (See Comments)    hallucinations   . Percocet [Oxycodone-Acetaminophen] Nausea And Vomiting     MEDICATION LIST PRIOR TO VISIT: Current Outpatient Medications on File Prior to Visit  Medication Sig Dispense Refill  . amoxicillin (AMOXIL) 500 MG tablet Take 2,000 mg by mouth See admin instructions. Before dental procedures    . aspirin EC 81 MG tablet Take 81 mg by mouth daily.    . Biotin 1000 MCG CHEW Chew 1 each by mouth daily.    . Cholecalciferol (VITAMIN D3) 5000 units TABS Take 5,000 Units by mouth daily.     . clindamycin (CLEOCIN T) 1 % lotion Apply 1 application topically every morning.    . diclofenac (VOLTAREN) 50 MG EC tablet Take 50 mg by mouth 2 (two) times daily as needed.    . doxycycline (DORYX) 100 MG EC tablet Take 100 mg by mouth 2 (two) times daily.    Marland Kitchen FLUoxetine (PROZAC) 20 MG capsule Take 20 mg by mouth daily.    . rosuvastatin (CRESTOR) 10 MG tablet Take 10 mg by mouth daily.     . traMADol (ULTRAM) 50 MG tablet Take 50 mg by mouth every 6 (  six) hours as needed for severe pain.    . vitamin B-12 (CYANOCOBALAMIN) 1000 MCG tablet Take 1,000 mcg by mouth daily.    Marland Kitchen losartan (COZAAR) 50 MG tablet Take 1 tablet (50 mg total) by mouth daily. 90 tablet 3   No current facility-administered medications on file prior to visit.    PAST MEDICAL HISTORY: Past Medical History:  Diagnosis Date  . Anxiety   . Arthritis    "some in my back; was in my hips" (03/22/2016)  . Asthma   . Chronic lower back pain   . Depression   . Family history of anesthesia complication    "my aunt didn't go out during  OR"  . Fibrocystic breast disease   . H/O hiatal hernia   . High cholesterol   . HLD (hyperlipidemia) 05/08/2019  . IBS (irritable bowel syndrome)   . Obesity   . PONV (postoperative nausea and vomiting)    bad after last hip replacement  . Sleep apnea    "never RX mask" (03/22/2016)    PAST SURGICAL HISTORY: Past Surgical History:  Procedure Laterality Date  . BACK SURGERY    . BILATERAL TEMPOROMANDIBULAR JOINT ARTHROPLASTY Bilateral 2002  . BREAST CYST ASPIRATION Left 1980's  . COLONOSCOPY W/ POLYPECTOMY  ~ 2007  . DEBRIDEMENT TENNIS ELBOW Right 1990  . DILATION AND CURETTAGE OF UTERUS  1980's X 2  . EYE SURGERY Bilateral 1974   "Pinguecula"  . FINGER SURGERY Right    index cyst removed  . JOINT REPLACEMENT    . KNEE ARTHROSCOPY WITH SUBCHONDROPLASTY Left 05/08/2018   Procedure: KNEE ARTHROSCOPY WITH SUBCHONDROPLASTY;  Surgeon: Melrose Nakayama, MD;  Location: [place];  Service: Orthopedics;  Laterality: Left;  . LAPAROSCOPIC CHOLECYSTECTOMY  1999  . [place] SURGERY  01/2009  . LUMBAR FUSION  08/2009  . LUMBAR LAMINECTOMY  1981  . MASS EXCISION Left 04/10/2017   Procedure: EXCISION OF LEFT INDEX FINGER CYST;  Surgeon: Milly Jakob, MD;  Location: [place];  Service: Orthopedics;  Laterality: Left;  . TONSILLECTOMY  1983  . TOTAL ABDOMINAL HYSTERECTOMY  1996  . TOTAL HIP ARTHROPLASTY Right 03/18/2014   Procedure: RIGHT TOTAL HIP ARTHROPLASTY ANTERIOR APPROACH;  Surgeon: Hessie Dibble, MD;  Location: [place];  Service: Orthopedics;  Laterality: Right;  . TOTAL HIP ARTHROPLASTY Left 03/22/2016  . TOTAL HIP ARTHROPLASTY Left 03/22/2016   Procedure: TOTAL HIP ARTHROPLASTY ANTERIOR APPROACH;  Surgeon: Melrose Nakayama, MD;  Location: [place];  Service: Orthopedics;  Laterality: Left;  . TOTAL KNEE ARTHROPLASTY Left 11/13/2018   Procedure: TOTAL KNEE ARTHROPLASTY;  Surgeon: Melrose Nakayama, MD;  Location: [place];  Service: Orthopedics;  Laterality: Left;  . TUBAL LIGATION   1981    FAMILY HISTORY: The patient's family history includes Colon cancer in her father; Heart disease in her mother.   SOCIAL HISTORY:  The patient  reports that she has never smoked. She has never used smokeless tobacco. She reports current alcohol use. She reports that she does not use drugs.  Review of Systems  Constitution: Negative for chills and fever.  HENT: Negative for hoarse voice and nosebleeds.   Eyes: Negative for discharge, double vision and pain.  Cardiovascular: Negative for chest pain, claudication, dyspnea on exertion, leg swelling, near-syncope, orthopnea, palpitations, paroxysmal nocturnal dyspnea and syncope.  Respiratory: Negative for hemoptysis and shortness of breath.   Musculoskeletal: Negative for muscle cramps and myalgias.  Gastrointestinal: Negative for abdominal pain, constipation, diarrhea, hematemesis, hematochezia, melena, nausea and  vomiting.  Neurological: Negative for dizziness and light-headedness.    PHYSICAL EXAM: Vitals with BMI 03/04/2020 08/21/2019 06/19/2019  Height 5' 7"  5' 7"  5' 6"   Weight 177 lbs 175 lbs 176 lbs  BMI 27.72 46.5 68.12  Systolic 751 700 174  Diastolic 80 68 71  Pulse 63 68 67   CONSTITUTIONAL: Well-developed and well-nourished. No acute distress.  SKIN: Skin is warm and dry. No rash noted. No cyanosis. No pallor. No jaundice HEAD: Normocephalic and atraumatic.  EYES: No scleral icterus MOUTH/THROAT: Moist oral membranes.  NECK: No JVD present. No thyromegaly noted. Left carotid bruit.  LYMPHATIC: No visible cervical adenopathy.  CHEST Normal respiratory effort. No intercostal retractions  LUNGS: Clear to auscultation bilaterally.  No stridor. No wheezes. No rales.  CARDIOVASCULAR: Regular rate and rhythm, positive S1-S2, no murmurs rubs or gallops appreciated. ABDOMINAL: No apparent ascites.  EXTREMITIES: No peripheral edema  HEMATOLOGIC: No significant bruising NEUROLOGIC: Oriented to person, place, and time.  Nonfocal. Normal muscle tone.  PSYCHIATRIC: Normal mood and affect. Normal behavior. Cooperative  CARDIAC DATABASE: EKG: 03/04/2020: Sinus bradycardia, 58 bpm, normal axis, poor R wave progression, without underlying ischemia or injury pattern.  Echocardiogram: None  Stress Testing:  None  Heart Catheterization: None  Carotid duplex: 03/29/2019: Stenosis in the left internal carotid artery (50-69%). No hemodynamically significant stenosis right ICA. Antegrade right vertebral artery flow. Antegrade left vertebral artery flow. Compared to the study done on 01/17/2017, previously bilateral ICA stenosis of less than 50% was noted. This represents a progression of extracranial cerebral vascular disease. Follow up in six months is appropriate if clinically indicated.  LABORATORY DATA: CBC Latest Ref Rng & Units 11/02/2018 04/30/2018 03/10/2016  WBC 4.0 - 10.5 K/uL 7.9 5.5 8.6  Hemoglobin 12.0 - 15.0 g/dL 13.4 13.1 13.2  Hematocrit 36.0 - 46.0 % 42.7 42.2 42.1  Platelets 150 - 400 K/uL 278 257 256    CMP Latest Ref Rng & Units 06/12/2019 11/02/2018 04/30/2018  Glucose 65 - 99 mg/dL 103(H) 110(H) 93  BUN 8 - 27 mg/dL 16 12 7(L)  Creatinine 0.57 - 1.00 mg/dL 0.80 0.84 0.83  Sodium 134 - 144 mmol/L 143 142 142  Potassium 3.5 - 5.2 mmol/L 5.1 4.2 3.6  Chloride 96 - 106 mmol/L 105 106 107  CO2 20 - 29 mmol/L 26 27 29   Calcium 8.7 - 10.3 mg/dL 9.0 9.3 8.8(L)  Total Protein 6.5 - 8.1 g/dL - - 6.7  Total Bilirubin 0.3 - 1.2 mg/dL - - 0.7  Alkaline Phos 38 - 126 U/L - - 58  AST 15 - 41 U/L - - 16  ALT 0 - 44 U/L - - 13    External Labs: 02/20/2019: CBC normal, total cholesterol 181, triglycerides 180, HDL 58, LDL 87, non-HDL cholesterol 123. Serum glucose 101 mg, BUN 19, creatinine 0.73, EGFR 79 mL, potassium 5.1. CMP otherwise normal.  Collected: January 23, 2020 Creatinine 0.73 mg/dL. Lipid profile: Total cholesterol 170 triglycerides 154, HDL 63, LDL 80, TSH: 2.0  Cardiac Panel (last 3  results) No results for input(s): CKTOTAL, CKMB, TROPONINIHS, RELINDX in the last 72 hours.  IMPRESSION:    ICD-10-CM   1. Left carotid stenosis  I65.22   2. Essential hypertension  I10 EKG 12-Lead    PCV ECHOCARDIOGRAM COMPLETE  3. Mixed hyperlipidemia  E78.2      RECOMMENDATIONS: Karema Tocci is a 74 y.o. female whose past medical history and cardiovascular risk factors include: left carotid artery disease, hypertension, hyperlipidemia, postmenopausal female,  advanced age.   Left carotid artery stenosis:  Currently asymptomatic.  Continue aspirin 81 mg p.o. daily.  Patient is currently on Crestor 10 mg p.o. nightly.  Patient's most recent lipid profile reviewed.  Total cholesterol and LDL are improving.  But her LDL is greater than 70 mg/dL.  Estimated 10-year risk of ASCVD is estimated to be approximately 16% and therefore recommended that she increase her Crestor to 20 mg p.o. nightly.  Patient states that this medication and cholesterol is currently being managed by primary care.  She is more than welcome to speak to Dr. Inda Merlin prior to uptitrating her statin therapy.  Will check carotid duplex to reevaluate her carotid artery atherosclerosis at the next available appointment.  Benign essential hypertension: . Office blood pressure is at goal.  . Medication reconciled.  . She is asked to keep a log of both blood pressure and pulse so that medications can be titrated based on a trend as opposed to isolated blood pressure readings in the office. . If the blood pressure is consistently greater than 144mHg patient is asked to call the office for medication titration sooner than the next office visit.  . Low salt diet recommended. A diet that is rich in fruits, vegetables, legumes, and low-fat dairy products and low in snacks, sweets, and meats (such as the Dietary Approaches to Stop Hypertension [DASH] diet). d . Echocardiogram will be ordered to evaluate for structural heart  disease and left ventricular systolic function.  Mixed hyperlipidemia: Currently on Crestor.  Most recent lipid profile reviewed.  Does not endorse myalgias.   FINAL MEDICATION LIST END OF ENCOUNTER: No orders of the defined types were placed in this encounter.   Medications Discontinued During This Encounter  Medication Reason  . diclofenac sodium (VOLTAREN) 1 % GEL Duplicate  . omeprazole (PRILOSEC) 20 MG capsule Completed Course     Current Outpatient Medications:  .  amoxicillin (AMOXIL) 500 MG tablet, Take 2,000 mg by mouth See admin instructions. Before dental procedures, Disp: , Rfl:  .  aspirin EC 81 MG tablet, Take 81 mg by mouth daily., Disp: , Rfl:  .  Biotin 1000 MCG CHEW, Chew 1 each by mouth daily., Disp: , Rfl:  .  Cholecalciferol (VITAMIN D3) 5000 units TABS, Take 5,000 Units by mouth daily. , Disp: , Rfl:  .  clindamycin (CLEOCIN T) 1 % lotion, Apply 1 application topically every morning., Disp: , Rfl:  .  diclofenac (VOLTAREN) 50 MG EC tablet, Take 50 mg by mouth 2 (two) times daily as needed., Disp: , Rfl:  .  doxycycline (DORYX) 100 MG EC tablet, Take 100 mg by mouth 2 (two) times daily., Disp: , Rfl:  .  FLUoxetine (PROZAC) 20 MG capsule, Take 20 mg by mouth daily., Disp: , Rfl:  .  rosuvastatin (CRESTOR) 10 MG tablet, Take 10 mg by mouth daily. , Disp: , Rfl:  .  traMADol (ULTRAM) 50 MG tablet, Take 50 mg by mouth every 6 (six) hours as needed for severe pain., Disp: , Rfl:  .  vitamin B-12 (CYANOCOBALAMIN) 1000 MCG tablet, Take 1,000 mcg by mouth daily., Disp: , Rfl:  .  losartan (COZAAR) 50 MG tablet, Take 1 tablet (50 mg total) by mouth daily., Disp: 90 tablet, Rfl: 3  Orders Placed This Encounter  Procedures  . EKG 12-Lead  . PCV ECHOCARDIOGRAM COMPLETE   --Continue cardiac medications as reconciled in final medication list. --Return in about 4 weeks (around 04/01/2020) for cartoid disease follow up . .Marland Kitchen  Or sooner if needed. --Continue follow-up with your  primary care physician regarding the management of your other chronic comorbid conditions.  Patient's questions and concerns were addressed to her satisfaction. She voices understanding of the instructions provided during this encounter.   This note was created using a voice recognition software as a result there may be grammatical errors inadvertently enclosed that do not reflect the nature of this encounter. Every attempt is made to correct such errors.  Rex Kras, [place], [facility]  Pager: [phone] Office: [phone]

## 2020-03-05 ENCOUNTER — Other Ambulatory Visit: Payer: Self-pay

## 2020-03-05 ENCOUNTER — Ambulatory Visit: Payer: Medicare HMO

## 2020-03-05 DIAGNOSIS — M48062 Spinal stenosis, lumbar region with neurogenic claudication: Secondary | ICD-10-CM | POA: Diagnosis not present

## 2020-03-05 DIAGNOSIS — I10 Essential (primary) hypertension: Secondary | ICD-10-CM

## 2020-03-06 ENCOUNTER — Other Ambulatory Visit: Payer: Self-pay | Admitting: Neurological Surgery

## 2020-03-09 ENCOUNTER — Other Ambulatory Visit: Payer: Self-pay

## 2020-03-09 ENCOUNTER — Ambulatory Visit: Payer: Medicare HMO

## 2020-03-09 DIAGNOSIS — I6522 Occlusion and stenosis of left carotid artery: Secondary | ICD-10-CM

## 2020-03-11 ENCOUNTER — Other Ambulatory Visit: Payer: Self-pay | Admitting: Cardiology

## 2020-03-11 DIAGNOSIS — I6522 Occlusion and stenosis of left carotid artery: Secondary | ICD-10-CM

## 2020-03-23 ENCOUNTER — Other Ambulatory Visit: Payer: Self-pay | Admitting: Cardiology

## 2020-03-23 ENCOUNTER — Other Ambulatory Visit: Payer: Medicare HMO

## 2020-03-23 DIAGNOSIS — Z01810 Encounter for preprocedural cardiovascular examination: Secondary | ICD-10-CM

## 2020-03-23 DIAGNOSIS — I10 Essential (primary) hypertension: Secondary | ICD-10-CM

## 2020-03-24 DIAGNOSIS — M4807 Spinal stenosis, lumbosacral region: Secondary | ICD-10-CM | POA: Diagnosis not present

## 2020-03-30 ENCOUNTER — Ambulatory Visit: Payer: Medicare HMO | Admitting: Cardiology

## 2020-04-10 ENCOUNTER — Encounter (HOSPITAL_COMMUNITY): Payer: Self-pay

## 2020-04-10 ENCOUNTER — Encounter (HOSPITAL_COMMUNITY)
Admission: RE | Admit: 2020-04-10 | Discharge: 2020-04-10 | Disposition: A | Discharge status: Home / Self Care | Payer: Medicare HMO | Source: Ambulatory Visit | Attending: Neurological Surgery | Admitting: Neurological Surgery

## 2020-04-10 ENCOUNTER — Other Ambulatory Visit: Payer: Self-pay

## 2020-04-10 DIAGNOSIS — Z01818 Encounter for other preprocedural examination: Secondary | ICD-10-CM | POA: Insufficient documentation

## 2020-04-10 DIAGNOSIS — M48061 Spinal stenosis, lumbar region without neurogenic claudication: Secondary | ICD-10-CM

## 2020-04-10 HISTORY — DX: Essential (primary) hypertension: I10

## 2020-04-10 HISTORY — DX: Failed or difficult intubation, initial encounter: T88.4XXA

## 2020-04-10 LAB — BASIC METABOLIC PANEL
Anion gap: 9 (ref 5–15)
BUN: 15 mg/dL (ref 8–23)
CO2: 27 mmol/L (ref 22–32)
Calcium: 9 mg/dL (ref 8.9–10.3)
Chloride: 105 mmol/L (ref 98–111)
Creatinine, Ser: 0.89 mg/dL (ref 0.44–1.00)
GFR calc Af Amer: >60 mL/min (ref >60)
GFR calc non Af Amer: >60 mL/min (ref >60)
Glucose, Bld: 99 mg/dL (ref 70–99)
Potassium: 3.9 mmol/L (ref 3.5–5.1)
Sodium: 141 mmol/L (ref 135–145)

## 2020-04-10 LAB — PROTIME-INR
INR: 1 (ref 0.8–1.2)
Prothrombin Time: 12.5 seconds (ref 11.4–15.2)

## 2020-04-10 LAB — CBC WITH DIFFERENTIAL/PLATELET
Abs Immature Granulocytes: 0.02 10*3/uL (ref 0.00–0.07)
Basophils Absolute: 0 10*3/uL (ref 0.0–0.1)
Basophils Relative: 0 %
Eosinophils Absolute: 0.2 10*3/uL (ref 0.0–0.5)
Eosinophils Relative: 2 %
HCT: 39.8 % (ref 36.0–46.0)
Hemoglobin: 12.9 g/dL (ref 12.0–15.0)
Immature Granulocytes: 0 %
Lymphocytes Relative: 23 %
Lymphs Abs: 1.7 10*3/uL (ref 0.7–4.0)
MCH: 31.2 pg (ref 26.0–34.0)
MCHC: 32.4 g/dL (ref 30.0–36.0)
MCV: 96.1 fL (ref 80.0–100.0)
Monocytes Absolute: 0.5 10*3/uL (ref 0.1–1.0)
Monocytes Relative: 7 %
Neutro Abs: 5.1 10*3/uL (ref 1.7–7.7)
Neutrophils Relative %: 68 %
Platelets: 251 10*3/uL (ref 150–400)
RBC: 4.14 MIL/uL (ref 3.87–5.11)
RDW: 13.9 % (ref 11.5–15.5)
WBC: 7.5 10*3/uL (ref 4.0–10.5)
nRBC: 0 % (ref 0.0–0.2)

## 2020-04-10 LAB — TYPE AND SCREEN
ABO/RH(D): B POS
Antibody Screen: NEGATIVE

## 2020-04-10 LAB — SURGICAL PCR SCREEN
MRSA, PCR: NEGATIVE
Staphylococcus aureus: NEGATIVE

## 2020-04-10 NOTE — Pre-Procedure Instructions (Signed)
[id number] [address], [place] [address] Phone: [phone] Fax: [phone]      Your procedure is scheduled on Monday, July 19th.  Report to [facility] Main Entrance "A" at 5:30 A.M., and check in at the Admitting office.  Call this number if you have problems the morning of surgery:  [phone]  Call [phone] if you have any questions prior to your surgery date Monday-Friday 8am-4pm    Remember:  Do not eat or drink after midnight the night before your surgery    Take these medicines the morning of surgery with A SIP OF WATER  acetaminophen (TYLENOL) -for mild pain traMADol (ULTRAM) -as needed for moderate to severe pain  Follow your surgeon's instructions on when to stop Aspirin.  If no instructions were given by your surgeon then you will need to call the office to get those instructions.    As of today, STOP taking any Aspirin (unless otherwise instructed by your surgeon) Aleve, Naproxen, Ibuprofen, Motrin, Advil, Goody's, BC's, all herbal medications, fish oil, and all vitamins. This includes: diclofenac (VOLTAREN).                       Do not wear jewelry, make up, or nail polish            Do not wear lotions, powders, perfumes, or deodorant.            Do not shave 48 hours prior to surgery.              Do not bring valuables to the hospital.            [facility] is not responsible for any belongings or valuables.  Do NOT Smoke (Tobacco/Vaping) or drink Alcohol 24 hours prior to your procedure If you use a CPAP at night, you may bring all equipment for your overnight stay.   Contacts, glasses, dentures or bridgework may not be worn into surgery.      For patients admitted to the hospital, discharge time will be determined by your treatment team.   Patients discharged the day of surgery will not be allowed to drive home, and someone needs to stay with them for 24 hours.    Special  instructions:   [facility]- Preparing For Surgery  Before surgery, you can play an important role. Because skin is not sterile, your skin needs to be as free of germs as possible. You can reduce the number of germs on your skin by washing with CHG (chlorahexidine gluconate) Soap before surgery.  CHG is an antiseptic cleaner which kills germs and bonds with the skin to continue killing germs even after washing.    Oral Hygiene is also important to reduce your risk of infection.  Remember - BRUSH YOUR TEETH THE MORNING OF SURGERY WITH YOUR REGULAR TOOTHPASTE  Please do not use if you have an allergy to CHG or antibacterial soaps. If your skin becomes reddened/irritated stop using the CHG.  Do not shave (including legs and underarms) for at least 48 hours prior to first CHG shower. It is OK to shave your face.  Please follow these instructions carefully.   1. Shower the NIGHT BEFORE SURGERY and the MORNING OF SURGERY with CHG Soap.   2. If you chose to wash your hair, wash your hair first as usual with your normal shampoo.  3. After you shampoo, rinse your hair and body thoroughly to remove  the shampoo.  4. Use CHG as you would any other liquid soap. You can apply CHG directly to the skin and wash gently with a scrungie or a clean washcloth.   5. Apply the CHG Soap to your body ONLY FROM THE NECK DOWN.  Do not use on open wounds or open sores. Avoid contact with your eyes, ears, mouth and genitals (private parts). Wash Face and genitals (private parts)  with your normal soap.   6. Wash thoroughly, paying special attention to the area where your surgery will be performed.  7. Thoroughly rinse your body with warm water from the neck down.  8. DO NOT shower/wash with your normal soap after using and rinsing off the CHG Soap.  9. Pat yourself dry with a CLEAN TOWEL.  10. Wear CLEAN PAJAMAS to bed the night before surgery  11. Place CLEAN SHEETS on your bed the night of your first shower and  DO NOT SLEEP WITH PETS.   Day of Surgery: Wear Clean/Comfortable clothing the morning of surgery Do not apply any deodorants/lotions.   Remember to brush your teeth WITH YOUR REGULAR TOOTHPASTE.   Please read over the following fact sheets that you were given.

## 2020-04-10 NOTE — Progress Notes (Signed)
PCP - Dr. Josetta Huddle Cardiologist - Dr. Einar Gip  Chest x-ray - 04/10/20 EKG - 03/04/20 Stress Test -scheduled for 04/13/20  ECHO - 03/05/20 Cardiac Cath -denies   Sleep Study - OSA+ CPAP - denies use  Blood Thinner Instructions: n/a Aspirin Instructions: Asked to call office to receive instructions.    COVID TEST- 04/17/20   Anesthesia review: Yes, pending results of a stress test scheduled for 04/13/20. Noted in chart that pt had a difficult intubation in 03/2014. Jeneen Rinks, [place] made aware and spoke with Dr. Ermalene Postin. No interventions at this time.   Patient denies shortness of breath, fever, cough and chest pain at PAT appointment   All instructions explained to the patient, with a verbal understanding of the material. Patient agrees to go over the instructions while at home for a better understanding. Patient also instructed to self quarantine after being tested for COVID-19. The opportunity to ask questions was provided.   Coronavirus Screening  Have you experienced the following symptoms:  Cough yes/no: No Fever (>100.68F)  yes/no: No Runny nose yes/no: No Sore throat yes/no: No Difficulty breathing/shortness of breath  yes/no: No  Have you or a family member traveled in the last 14 days and where? yes/no: No   If the patient indicates "YES" to the above questions, their PAT will be rescheduled to limit the exposure to others and, the surgeon will be notified. THE PATIENT WILL NEED TO BE ASYMPTOMATIC FOR 14 DAYS.   If the patient is not experiencing any of these symptoms, the PAT nurse will instruct them to NOT bring anyone with them to their appointment since they may have these symptoms or traveled as well.   Please remind your patients and families that hospital visitation restrictions are in effect and the importance of the restrictions.

## 2020-04-13 ENCOUNTER — Other Ambulatory Visit: Payer: Self-pay

## 2020-04-13 ENCOUNTER — Ambulatory Visit: Payer: Medicare HMO

## 2020-04-13 DIAGNOSIS — Z01810 Encounter for preprocedural cardiovascular examination: Secondary | ICD-10-CM | POA: Diagnosis not present

## 2020-04-13 DIAGNOSIS — I10 Essential (primary) hypertension: Secondary | ICD-10-CM | POA: Diagnosis not present

## 2020-04-13 NOTE — Progress Notes (Signed)
Anesthesia Chart Review:  Patient follows with cardiology for history of hypertension, hyperlipidemia, and carotid disease (left ICA 50-69%).  Last seen by cardiologist Dr.Tolia 03/04/2020.  At that time a carotid duplex and echocardiogram were ordered.  Carotid studies showed no change compared to study done 10/02/2019, 82-month follow-up recommended.  Echo showed normal LV systolic function with EF 60-65%.  Normal wall motion.  Mild MR, mild TR, mild PR.  Nuclear stress test was subsequently ordered for preoperative risk stratification and it was low risk.   History of difficult intubation 03/18/2014 when she had a right THA.  See intubation note from that date.  Case discussed with Dr. Ermalene Postin, he advised glidescope be available for intubation.  Preop labs reviewed, WNL.  EKG 03/04/2020: Sinus bradycardia, 58 bpm, normal axis, poor R wave progression, without underlying ischemia or injury pattern.  Carotid artery duplex 03/09/2020:  No hemodynamically significant stenosis right ICA.  Stenosis in the left internal carotid artery (50-69%).  Antegrade right vertebral artery flow. Antegrade left vertebral artery  flow.  Compared to the study done on 10/02/2019, no significant change noted.  Follow up in six months is appropriate if clinically indicated.  Lexiscan/modified Bruce Tetrofosmin stress test 04/13/2020: Lexiscan/modified Bruce nuclear stress test performed using 1-day protocol. Stress EKG is non-diagnostic, as this is pharmacological stress test. In addition, stress EKG at 86% MPHR showed sinus tachycardia, no significant ST-T changes. Normal myocardial perfusion. Stress LVEF calculated 44%, although visually appears normal. Low risk study.  Echocardiogram 03/05/2020:  Normal LV systolic function with visual EF 60-65%. Left ventricle cavity  is normal in size. Normal global wall motion. Normal diastolic filling  pattern, normal LAP. Calculated EF 63%.  Mild (Grade I) mitral  regurgitation.  Mild tricuspid regurgitation.  Mild pulmonic regurgitation.  No prior study for comparison.  Kimberly Lowery [facility] Short Stay Center/Anesthesiology Phone [phone] 04/14/2020 2:58 PM

## 2020-04-14 NOTE — Anesthesia Preprocedure Evaluation (Addendum)
Anesthesia Evaluation  Patient identified by MRN, date of birth, ID band Patient awake    Reviewed: Allergy & Precautions, H&P , NPO status , Patient's Chart, lab work & pertinent test results  History of Anesthesia Complications (+) PONV, DIFFICULT AIRWAY and Family history of anesthesia reaction  Airway Mallampati: III  TM Distance: >3 FB Neck ROM: Full  Mouth opening: Limited Mouth Opening  Dental no notable dental hx. (+) Teeth Intact, Dental Advisory Given   Pulmonary asthma , sleep apnea ,    Pulmonary exam normal breath sounds clear to auscultation       Cardiovascular Exercise Tolerance: Good hypertension, Pt. on medications  Rhythm:Regular Rate:Normal     Neuro/Psych Anxiety Depression negative neurological ROS     GI/Hepatic Neg liver ROS, hiatal hernia,   Endo/Other  negative endocrine ROS  Renal/GU negative Renal ROS  negative genitourinary   Musculoskeletal  (+) Arthritis , Osteoarthritis,    Abdominal   Peds  Hematology negative hematology ROS (+)   Anesthesia Other Findings   Reproductive/Obstetrics negative OB ROS                           Anesthesia Physical Anesthesia Plan  ASA: III  Anesthesia Plan: General   Post-op Pain Management:    Induction: Intravenous  PONV Risk Score and Plan: 4 or greater and Ondansetron, Dexamethasone, Propofol infusion and Scopolamine patch - Pre-op  Airway Management Planned: Oral ETT and Video Laryngoscope Planned  Additional Equipment:   Intra-op Plan:   Post-operative Plan: Extubation in OR  Informed Consent: I have reviewed the patients History and Physical, chart, labs and discussed the procedure including the risks, benefits and alternatives for the proposed anesthesia with the patient or authorized representative who has indicated his/her understanding and acceptance.     Dental advisory given  Plan Discussed with:  CRNA  Anesthesia Plan Comments: (PAT note by Karoline Caldwell, PA-C: Patient follows with cardiology for history of hypertension, hyperlipidemia, and carotid disease (left ICA 50-69%).  Last seen by cardiologist Dr.Tolia 03/04/2020.  At that time a carotid duplex and echocardiogram were ordered.  Carotid studies showed no change compared to study done 10/02/2019, 64-month follow-up recommended.  Echo showed normal LV systolic function with EF 60-65%.  Normal wall motion.  Mild MR, mild TR, mild PR.  Nuclear stress test was subsequently ordered for preoperative risk stratification and it was low risk.   History of difficult intubation 03/18/2014 when she had a right THA.  See intubation note from that date.  Case discussed with Dr. Ermalene Postin, he advised glidescope be available for intubation.  Preop labs reviewed, WNL.  EKG 03/04/2020: Sinus bradycardia, 58 bpm, normal axis, poor R wave progression, without underlying ischemia or injury pattern.  Carotid artery duplex 03/09/2020:  No hemodynamically significant stenosis right ICA.  Stenosis in the left internal carotid artery (50-69%).  Antegrade right vertebral artery flow. Antegrade left vertebral artery  flow.  Compared to the study done on 10/02/2019, no significant change noted.  Follow up in six months is appropriate if clinically indicated.  Lexiscan/modified Bruce Tetrofosmin stress test 04/13/2020: Lexiscan/modified Bruce nuclear stress test performed using 1-day protocol. Stress EKG is non-diagnostic, as this is pharmacological stress test. In addition, stress EKG at 86% MPHR showed sinus tachycardia, no significant ST-T changes. Normal myocardial perfusion. Stress LVEF calculated 44%, although visually appears normal. Low risk study.  Echocardiogram 03/05/2020:  Normal LV systolic function with visual EF 60-65%. Left ventricle  cavity  is normal in size. Normal global wall motion. Normal diastolic filling  pattern, normal LAP. Calculated EF  63%.  Mild (Grade I) mitral regurgitation.  Mild tricuspid regurgitation.  Mild pulmonic regurgitation.  No prior study for comparison.  )       Anesthesia Quick Evaluation

## 2020-04-17 ENCOUNTER — Other Ambulatory Visit (HOSPITAL_COMMUNITY)
Admission: RE | Admit: 2020-04-17 | Discharge: 2020-04-17 | Disposition: A | Discharge status: Home / Self Care | Payer: Medicare HMO | Source: Ambulatory Visit | Attending: Neurological Surgery | Admitting: Neurological Surgery

## 2020-04-17 DIAGNOSIS — Z01812 Encounter for preprocedural laboratory examination: Secondary | ICD-10-CM | POA: Insufficient documentation

## 2020-04-17 DIAGNOSIS — Z20822 Contact with and (suspected) exposure to covid-19: Secondary | ICD-10-CM | POA: Insufficient documentation

## 2020-04-17 LAB — SARS CORONAVIRUS 2 (TAT 6-24 HRS): SARS Coronavirus 2: NEGATIVE

## 2020-04-20 ENCOUNTER — Inpatient Hospital Stay (HOSPITAL_COMMUNITY): Payer: Medicare HMO

## 2020-04-20 ENCOUNTER — Inpatient Hospital Stay (HOSPITAL_COMMUNITY): Payer: Medicare HMO | Admitting: Anesthesiology

## 2020-04-20 ENCOUNTER — Other Ambulatory Visit: Payer: Self-pay

## 2020-04-20 ENCOUNTER — Encounter (HOSPITAL_COMMUNITY)
Admission: RE | Disposition: A | Discharge status: Home / Self Care | Payer: Self-pay | Source: Home / Self Care | Attending: Neurological Surgery

## 2020-04-20 ENCOUNTER — Encounter (HOSPITAL_COMMUNITY): Payer: Self-pay | Admitting: Neurological Surgery

## 2020-04-20 ENCOUNTER — Inpatient Hospital Stay (HOSPITAL_COMMUNITY)
Admission: RE | Admit: 2020-04-20 | Discharge: 2020-04-21 | DRG: 455 | Disposition: A | Discharge status: Home / Self Care | Payer: Medicare HMO | Attending: Neurological Surgery | Admitting: Neurological Surgery

## 2020-04-20 ENCOUNTER — Inpatient Hospital Stay (HOSPITAL_COMMUNITY): Payer: Medicare HMO | Admitting: Physician Assistant

## 2020-04-20 DIAGNOSIS — N6019 Diffuse cystic mastopathy of unspecified breast: Secondary | ICD-10-CM | POA: Diagnosis present

## 2020-04-20 DIAGNOSIS — G4730 Sleep apnea, unspecified: Secondary | ICD-10-CM | POA: Diagnosis present

## 2020-04-20 DIAGNOSIS — J45909 Unspecified asthma, uncomplicated: Secondary | ICD-10-CM | POA: Diagnosis not present

## 2020-04-20 DIAGNOSIS — Z96643 Presence of artificial hip joint, bilateral: Secondary | ICD-10-CM | POA: Diagnosis present

## 2020-04-20 DIAGNOSIS — M4326 Fusion of spine, lumbar region: Secondary | ICD-10-CM | POA: Diagnosis not present

## 2020-04-20 DIAGNOSIS — M48061 Spinal stenosis, lumbar region without neurogenic claudication: Secondary | ICD-10-CM | POA: Diagnosis not present

## 2020-04-20 DIAGNOSIS — E7800 Pure hypercholesterolemia, unspecified: Secondary | ICD-10-CM | POA: Diagnosis not present

## 2020-04-20 DIAGNOSIS — Z79891 Long term (current) use of opiate analgesic: Secondary | ICD-10-CM | POA: Diagnosis not present

## 2020-04-20 DIAGNOSIS — Z79899 Other long term (current) drug therapy: Secondary | ICD-10-CM | POA: Diagnosis not present

## 2020-04-20 DIAGNOSIS — Z888 Allergy status to other drugs, medicaments and biological substances status: Secondary | ICD-10-CM | POA: Diagnosis not present

## 2020-04-20 DIAGNOSIS — I10 Essential (primary) hypertension: Secondary | ICD-10-CM | POA: Diagnosis not present

## 2020-04-20 DIAGNOSIS — Z7982 Long term (current) use of aspirin: Secondary | ICD-10-CM | POA: Diagnosis not present

## 2020-04-20 DIAGNOSIS — Z91013 Allergy to seafood: Secondary | ICD-10-CM | POA: Diagnosis not present

## 2020-04-20 DIAGNOSIS — Z885 Allergy status to narcotic agent status: Secondary | ICD-10-CM

## 2020-04-20 DIAGNOSIS — F419 Anxiety disorder, unspecified: Secondary | ICD-10-CM | POA: Diagnosis present

## 2020-04-20 DIAGNOSIS — G8929 Other chronic pain: Secondary | ICD-10-CM | POA: Diagnosis present

## 2020-04-20 DIAGNOSIS — M1990 Unspecified osteoarthritis, unspecified site: Secondary | ICD-10-CM | POA: Diagnosis not present

## 2020-04-20 DIAGNOSIS — K589 Irritable bowel syndrome without diarrhea: Secondary | ICD-10-CM | POA: Diagnosis present

## 2020-04-20 DIAGNOSIS — Z419 Encounter for procedure for purposes other than remedying health state, unspecified: Secondary | ICD-10-CM

## 2020-04-20 DIAGNOSIS — Z981 Arthrodesis status: Secondary | ICD-10-CM

## 2020-04-20 DIAGNOSIS — R69 Illness, unspecified: Secondary | ICD-10-CM | POA: Diagnosis not present

## 2020-04-20 DIAGNOSIS — Z20822 Contact with and (suspected) exposure to covid-19: Secondary | ICD-10-CM | POA: Diagnosis present

## 2020-04-20 DIAGNOSIS — M532X6 Spinal instabilities, lumbar region: Secondary | ICD-10-CM | POA: Diagnosis not present

## 2020-04-20 DIAGNOSIS — F329 Major depressive disorder, single episode, unspecified: Secondary | ICD-10-CM | POA: Diagnosis present

## 2020-04-20 DIAGNOSIS — E785 Hyperlipidemia, unspecified: Secondary | ICD-10-CM | POA: Diagnosis present

## 2020-04-20 DIAGNOSIS — E669 Obesity, unspecified: Secondary | ICD-10-CM | POA: Diagnosis present

## 2020-04-20 DIAGNOSIS — M79604 Pain in right leg: Secondary | ICD-10-CM | POA: Diagnosis present

## 2020-04-20 DIAGNOSIS — Z8249 Family history of ischemic heart disease and other diseases of the circulatory system: Secondary | ICD-10-CM | POA: Diagnosis not present

## 2020-04-20 DIAGNOSIS — Z800 Family history of malignant neoplasm of digestive organs: Secondary | ICD-10-CM

## 2020-04-20 SURGERY — POSTERIOR LUMBAR FUSION 1 WITH HARDWARE REMOVAL
Anesthesia: General | Site: Spine Lumbar

## 2020-04-20 MED ORDER — DEXAMETHASONE SODIUM PHOSPHATE 10 MG/ML IJ SOLN
10.0000 mg | Freq: Once | INTRAMUSCULAR | Status: AC
  Administered 2020-04-20: 10 mg via INTRAVENOUS
  Filled 2020-04-20: qty 1

## 2020-04-20 MED ORDER — FENTANYL CITRATE (PF) 100 MCG/2ML IJ SOLN
INTRAMUSCULAR | Status: AC
  Filled 2020-04-20: qty 2

## 2020-04-20 MED ORDER — PROMETHAZINE HCL 25 MG PO TABS: 12.5000 mg | ORAL_TABLET | Freq: Four times a day (QID) | ORAL | Status: DC | PRN

## 2020-04-20 MED ORDER — CELECOXIB 200 MG PO CAPS: 200.0000 mg | ORAL_CAPSULE | Freq: Two times a day (BID) | ORAL | Status: DC

## 2020-04-20 MED ORDER — PROPOFOL 10 MG/ML IV BOLUS
INTRAVENOUS | Status: AC
  Filled 2020-04-20: qty 20

## 2020-04-20 MED ORDER — ONDANSETRON HCL 4 MG/2ML IJ SOLN
INTRAMUSCULAR | Status: DC | PRN
  Administered 2020-04-20: 4 mg via INTRAVENOUS

## 2020-04-20 MED ORDER — DEXAMETHASONE 4 MG PO TABS
4.0000 mg | ORAL_TABLET | Freq: Four times a day (QID) | ORAL | Status: DC
  Administered 2020-04-20 – 2020-04-21 (×3): 4 mg via ORAL
  Filled 2020-04-20 (×3): qty 1

## 2020-04-20 MED ORDER — CHLORHEXIDINE GLUCONATE CLOTH 2 % EX PADS: 6.0000 | MEDICATED_PAD | Freq: Once | CUTANEOUS | Status: DC

## 2020-04-20 MED ORDER — BUPIVACAINE HCL (PF) 0.25 % IJ SOLN
INTRAMUSCULAR | Status: DC | PRN
  Administered 2020-04-20: 4 mL

## 2020-04-20 MED ORDER — EPHEDRINE 5 MG/ML INJ
INTRAVENOUS | Status: AC
  Filled 2020-04-20: qty 10

## 2020-04-20 MED ORDER — DICLOFENAC SODIUM 25 MG PO TBEC
50.0000 mg | DELAYED_RELEASE_TABLET | Freq: Two times a day (BID) | ORAL | Status: DC
  Administered 2020-04-20 – 2020-04-21 (×2): 50 mg via ORAL
  Filled 2020-04-20: qty 1
  Filled 2020-04-20 (×2): qty 2

## 2020-04-20 MED ORDER — ONDANSETRON HCL 4 MG PO TABS: 4.0000 mg | ORAL_TABLET | Freq: Four times a day (QID) | ORAL | Status: DC | PRN

## 2020-04-20 MED ORDER — PHENOL 1.4 % MT LIQD: 1.0000 | OROMUCOSAL | Status: DC | PRN

## 2020-04-20 MED ORDER — FENTANYL CITRATE (PF) 250 MCG/5ML IJ SOLN
INTRAMUSCULAR | Status: AC
  Filled 2020-04-20: qty 5

## 2020-04-20 MED ORDER — PROPOFOL 10 MG/ML IV BOLUS
INTRAVENOUS | Status: DC | PRN
  Administered 2020-04-20: 120 mg via INTRAVENOUS

## 2020-04-20 MED ORDER — SCOPOLAMINE 1 MG/3DAYS TD PT72
1.0000 | MEDICATED_PATCH | TRANSDERMAL | Status: DC
  Filled 2020-04-20: qty 1

## 2020-04-20 MED ORDER — PROPOFOL 500 MG/50ML IV EMUL
INTRAVENOUS | Status: DC | PRN
  Administered 2020-04-20: 20 ug/kg/min via INTRAVENOUS

## 2020-04-20 MED ORDER — SCOPOLAMINE 1 MG/3DAYS TD PT72
MEDICATED_PATCH | TRANSDERMAL | Status: AC
  Administered 2020-04-20: 1.5 mg via TRANSDERMAL
  Filled 2020-04-20: qty 1

## 2020-04-20 MED ORDER — FLUOXETINE HCL 20 MG PO CAPS
20.0000 mg | ORAL_CAPSULE | Freq: Every day | ORAL | Status: DC
  Administered 2020-04-20: 20 mg via ORAL
  Filled 2020-04-20: qty 1

## 2020-04-20 MED ORDER — ACETAMINOPHEN 325 MG PO TABS
650.0000 mg | ORAL_TABLET | ORAL | Status: DC | PRN
  Administered 2020-04-20: 650 mg via ORAL
  Filled 2020-04-20: qty 2

## 2020-04-20 MED ORDER — CEFAZOLIN SODIUM-DEXTROSE 2-4 GM/100ML-% IV SOLN
2.0000 g | INTRAVENOUS | Status: AC
  Administered 2020-04-20: 2 g via INTRAVENOUS
  Filled 2020-04-20: qty 100

## 2020-04-20 MED ORDER — DEXAMETHASONE SODIUM PHOSPHATE 4 MG/ML IJ SOLN
4.0000 mg | Freq: Four times a day (QID) | INTRAMUSCULAR | Status: DC
  Administered 2020-04-20: 4 mg via INTRAVENOUS
  Filled 2020-04-20: qty 1

## 2020-04-20 MED ORDER — SODIUM CHLORIDE 0.9% FLUSH
3.0000 mL | Freq: Two times a day (BID) | INTRAVENOUS | Status: DC
  Administered 2020-04-20 (×2): 3 mL via INTRAVENOUS

## 2020-04-20 MED ORDER — METHOCARBAMOL 500 MG PO TABS
500.0000 mg | ORAL_TABLET | Freq: Four times a day (QID) | ORAL | Status: DC | PRN
  Administered 2020-04-20 – 2020-04-21 (×4): 500 mg via ORAL
  Filled 2020-04-20 (×4): qty 1

## 2020-04-20 MED ORDER — LOSARTAN POTASSIUM 50 MG PO TABS
50.0000 mg | ORAL_TABLET | Freq: Every day | ORAL | Status: DC
  Administered 2020-04-20: 50 mg via ORAL
  Filled 2020-04-20: qty 1

## 2020-04-20 MED ORDER — POTASSIUM CHLORIDE IN NACL 20-0.9 MEQ/L-% IV SOLN: INTRAVENOUS | Status: DC

## 2020-04-20 MED ORDER — THROMBIN 5000 UNITS EX SOLR
CUTANEOUS | Status: AC
  Filled 2020-04-20: qty 5000

## 2020-04-20 MED ORDER — SODIUM CHLORIDE 0.9% FLUSH: 3.0000 mL | INTRAVENOUS | Status: DC | PRN

## 2020-04-20 MED ORDER — SODIUM CHLORIDE 0.9 % IV SOLN
250.0000 mL | INTRAVENOUS | Status: DC
  Administered 2020-04-20: 250 mL via INTRAVENOUS

## 2020-04-20 MED ORDER — THROMBIN 20000 UNITS EX SOLR
CUTANEOUS | Status: DC | PRN
  Administered 2020-04-20: 20 mL via TOPICAL

## 2020-04-20 MED ORDER — SUCCINYLCHOLINE CHLORIDE 200 MG/10ML IV SOSY
PREFILLED_SYRINGE | INTRAVENOUS | Status: AC
  Filled 2020-04-20: qty 10

## 2020-04-20 MED ORDER — CHLORHEXIDINE GLUCONATE 0.12 % MT SOLN
15.0000 mL | Freq: Once | OROMUCOSAL | Status: AC
  Administered 2020-04-20: 15 mL via OROMUCOSAL
  Filled 2020-04-20: qty 15

## 2020-04-20 MED ORDER — FENTANYL CITRATE (PF) 100 MCG/2ML IJ SOLN
INTRAMUSCULAR | Status: DC | PRN
  Administered 2020-04-20 (×4): 50 ug via INTRAVENOUS

## 2020-04-20 MED ORDER — THROMBIN 20000 UNITS EX SOLR
CUTANEOUS | Status: AC
  Filled 2020-04-20: qty 20000

## 2020-04-20 MED ORDER — ONDANSETRON HCL 4 MG/2ML IJ SOLN
INTRAMUSCULAR | Status: AC
  Filled 2020-04-20: qty 2

## 2020-04-20 MED ORDER — HEPARIN SODIUM (PORCINE) 1000 UNIT/ML IJ SOLN
INTRAMUSCULAR | Status: AC
  Filled 2020-04-20: qty 1

## 2020-04-20 MED ORDER — BUPIVACAINE HCL (PF) 0.25 % IJ SOLN
INTRAMUSCULAR | Status: AC
  Filled 2020-04-20: qty 30

## 2020-04-20 MED ORDER — ACETAMINOPHEN 500 MG PO TABS
1000.0000 mg | ORAL_TABLET | Freq: Once | ORAL | Status: AC
  Administered 2020-04-20: 1000 mg via ORAL
  Filled 2020-04-20: qty 2

## 2020-04-20 MED ORDER — EPHEDRINE SULFATE 50 MG/ML IJ SOLN
INTRAMUSCULAR | Status: DC | PRN
  Administered 2020-04-20: 10 mg via INTRAVENOUS
  Administered 2020-04-20: 5 mg via INTRAVENOUS

## 2020-04-20 MED ORDER — MENTHOL 3 MG MT LOZG: 1.0000 | LOZENGE | OROMUCOSAL | Status: DC | PRN

## 2020-04-20 MED ORDER — CEFAZOLIN SODIUM-DEXTROSE 2-4 GM/100ML-% IV SOLN
2.0000 g | Freq: Three times a day (TID) | INTRAVENOUS | Status: AC
  Administered 2020-04-20 (×2): 2 g via INTRAVENOUS
  Filled 2020-04-20 (×2): qty 100

## 2020-04-20 MED ORDER — HYDROMORPHONE HCL 1 MG/ML IJ SOLN: 0.5000 mg | INTRAMUSCULAR | Status: DC | PRN

## 2020-04-20 MED ORDER — FENTANYL CITRATE (PF) 100 MCG/2ML IJ SOLN
25.0000 ug | INTRAMUSCULAR | Status: DC | PRN
  Administered 2020-04-20: 25 ug via INTRAVENOUS
  Administered 2020-04-20: 50 ug via INTRAVENOUS

## 2020-04-20 MED ORDER — LACTATED RINGERS IV SOLN: INTRAVENOUS | Status: DC

## 2020-04-20 MED ORDER — ASPIRIN EC 81 MG PO TBEC
81.0000 mg | DELAYED_RELEASE_TABLET | Freq: Every day | ORAL | Status: DC
  Administered 2020-04-20: 81 mg via ORAL
  Filled 2020-04-20: qty 1

## 2020-04-20 MED ORDER — HYDROMORPHONE HCL 2 MG PO TABS: 2.0000 mg | ORAL_TABLET | ORAL | Status: DC | PRN

## 2020-04-20 MED ORDER — SUGAMMADEX SODIUM 200 MG/2ML IV SOLN
INTRAVENOUS | Status: DC | PRN
  Administered 2020-04-20: 200 mg via INTRAVENOUS

## 2020-04-20 MED ORDER — SODIUM CHLORIDE (PF) 0.9 % IJ SOLN
INTRAMUSCULAR | Status: DC | PRN
  Administered 2020-04-20: 5 mL

## 2020-04-20 MED ORDER — ROCURONIUM BROMIDE 10 MG/ML (PF) SYRINGE
PREFILLED_SYRINGE | INTRAVENOUS | Status: AC
  Filled 2020-04-20: qty 10

## 2020-04-20 MED ORDER — ROCURONIUM BROMIDE 10 MG/ML (PF) SYRINGE
PREFILLED_SYRINGE | INTRAVENOUS | Status: DC | PRN
  Administered 2020-04-20: 20 mg via INTRAVENOUS
  Administered 2020-04-20: 60 mg via INTRAVENOUS

## 2020-04-20 MED ORDER — LACTATED RINGERS IV SOLN: INTRAVENOUS | Status: DC | PRN

## 2020-04-20 MED ORDER — PHENYLEPHRINE 40 MCG/ML (10ML) SYRINGE FOR IV PUSH (FOR BLOOD PRESSURE SUPPORT)
PREFILLED_SYRINGE | INTRAVENOUS | Status: AC
  Filled 2020-04-20: qty 10

## 2020-04-20 MED ORDER — ORAL CARE MOUTH RINSE: 15.0000 mL | Freq: Once | OROMUCOSAL | Status: AC

## 2020-04-20 MED ORDER — LIDOCAINE 2% (20 MG/ML) 5 ML SYRINGE
INTRAMUSCULAR | Status: DC | PRN
  Administered 2020-04-20: 60 mg via INTRAVENOUS

## 2020-04-20 MED ORDER — SUCCINYLCHOLINE CHLORIDE 20 MG/ML IJ SOLN
INTRAMUSCULAR | Status: DC | PRN
  Administered 2020-04-20: 100 mg via INTRAVENOUS

## 2020-04-20 MED ORDER — TRAMADOL HCL 50 MG PO TABS
100.0000 mg | ORAL_TABLET | Freq: Four times a day (QID) | ORAL | Status: DC | PRN
  Administered 2020-04-20 – 2020-04-21 (×4): 100 mg via ORAL
  Filled 2020-04-20 (×4): qty 2

## 2020-04-20 MED ORDER — ARTHREX ANGEL - ACD-A SOLUTION (CHARTING ONLY) OPTIME
TOPICAL | Status: DC | PRN
  Administered 2020-04-20: 10 mL via TOPICAL

## 2020-04-20 MED ORDER — HEPARIN SODIUM (PORCINE) 1000 UNIT/ML IJ SOLN
INTRAMUSCULAR | Status: DC | PRN
Start: 2020-04-20 — End: 2020-04-20
  Administered 2020-04-20: 5000 [IU]

## 2020-04-20 MED ORDER — SODIUM CHLORIDE 0.9 % IV SOLN
INTRAVENOUS | Status: DC | PRN
  Administered 2020-04-20: 500 mL

## 2020-04-20 MED ORDER — ONDANSETRON HCL 4 MG/2ML IJ SOLN: 4.0000 mg | Freq: Four times a day (QID) | INTRAMUSCULAR | Status: DC | PRN

## 2020-04-20 MED ORDER — METHOCARBAMOL 1000 MG/10ML IJ SOLN
500.0000 mg | Freq: Four times a day (QID) | INTRAVENOUS | Status: DC | PRN
  Filled 2020-04-20: qty 5

## 2020-04-20 MED ORDER — HYDROMORPHONE HCL 1 MG/ML IJ SOLN: 0.2500 mg | INTRAMUSCULAR | Status: DC | PRN

## 2020-04-20 MED ORDER — 0.9 % SODIUM CHLORIDE (POUR BTL) OPTIME
TOPICAL | Status: DC | PRN
  Administered 2020-04-20: 1000 mL

## 2020-04-20 MED ORDER — SENNA 8.6 MG PO TABS
1.0000 | ORAL_TABLET | Freq: Two times a day (BID) | ORAL | Status: DC
  Administered 2020-04-20 – 2020-04-21 (×2): 8.6 mg via ORAL
  Filled 2020-04-20 (×2): qty 1

## 2020-04-20 MED ORDER — PHENYLEPHRINE HCL-NACL 10-0.9 MG/250ML-% IV SOLN
INTRAVENOUS | Status: DC | PRN
  Administered 2020-04-20: 25 ug/min via INTRAVENOUS

## 2020-04-20 MED ORDER — ACETAMINOPHEN 650 MG RE SUPP: 650.0000 mg | RECTAL | Status: DC | PRN

## 2020-04-20 MED ORDER — LIDOCAINE 2% (20 MG/ML) 5 ML SYRINGE
INTRAMUSCULAR | Status: AC
  Filled 2020-04-20: qty 5

## 2020-04-20 MED ORDER — THROMBIN 5000 UNITS EX SOLR
OROMUCOSAL | Status: DC | PRN
  Administered 2020-04-20: 5 mL via TOPICAL

## 2020-04-20 SURGICAL SUPPLY — 68 items
BAG DECANTER FOR FLEXI CONT (MISCELLANEOUS) ×2 IMPLANT
BASKET BONE COLLECTION (BASKET) ×2 IMPLANT
BENZOIN TINCTURE PRP APPL 2/3 (GAUZE/BANDAGES/DRESSINGS) ×2 IMPLANT
BLADE CLIPPER SURG (BLADE) IMPLANT
BONE CANC CHIPS 20CC PCAN1/4 (Bone Implant) ×2 IMPLANT
BUR CARBIDE MATCH 3.0 (BURR) ×2 IMPLANT
CANISTER SUCT 3000ML PPV (MISCELLANEOUS) ×2 IMPLANT
CARTRIDGE OIL MAESTRO DRILL (MISCELLANEOUS) IMPLANT
CHIPS CANC BONE 20CC PCAN1/4 (Bone Implant) ×1 IMPLANT
CNTNR URN SCR LID CUP LEK RST (MISCELLANEOUS) ×1 IMPLANT
CONT SPEC 4OZ STRL OR WHT (MISCELLANEOUS) ×2
COVER BACK TABLE 60X90IN (DRAPES) ×2 IMPLANT
COVER WAND RF STERILE (DRAPES) IMPLANT
DERMABOND ADVANCED (GAUZE/BANDAGES/DRESSINGS) ×1
DERMABOND ADVANCED .7 DNX12 (GAUZE/BANDAGES/DRESSINGS) ×1 IMPLANT
DIFFUSER DRILL AIR PNEUMATIC (MISCELLANEOUS) IMPLANT
DRAPE C-ARM 42X72 X-RAY (DRAPES) ×2 IMPLANT
DRAPE C-ARMOR (DRAPES) ×2 IMPLANT
DRAPE LAPAROTOMY 100X72X124 (DRAPES) ×2 IMPLANT
DRAPE SURG 17X23 STRL (DRAPES) ×2 IMPLANT
DRSG OPSITE POSTOP 4X6 (GAUZE/BANDAGES/DRESSINGS) ×2 IMPLANT
DURAPREP 26ML APPLICATOR (WOUND CARE) ×2 IMPLANT
ELECT REM PT RETURN 9FT ADLT (ELECTROSURGICAL) ×2
ELECTRODE REM PT RTRN 9FT ADLT (ELECTROSURGICAL) ×1 IMPLANT
EVACUATOR 1/8 PVC DRAIN (DRAIN) IMPLANT
GAUZE 4X4 16PLY RFD (DISPOSABLE) IMPLANT
GLOVE BIO SURGEON STRL SZ 6.5 (GLOVE) ×8 IMPLANT
GLOVE BIO SURGEON STRL SZ7 (GLOVE) ×2 IMPLANT
GLOVE BIO SURGEON STRL SZ8 (GLOVE) ×4 IMPLANT
GLOVE BIOGEL PI IND STRL 6.5 (GLOVE) ×1 IMPLANT
GLOVE BIOGEL PI IND STRL 7.0 (GLOVE) ×1 IMPLANT
GLOVE BIOGEL PI INDICATOR 6.5 (GLOVE) ×1
GLOVE BIOGEL PI INDICATOR 7.0 (GLOVE) ×1
GOWN STRL REUS W/ TWL LRG LVL3 (GOWN DISPOSABLE) ×3 IMPLANT
GOWN STRL REUS W/ TWL XL LVL3 (GOWN DISPOSABLE) ×1 IMPLANT
GOWN STRL REUS W/TWL 2XL LVL3 (GOWN DISPOSABLE) IMPLANT
GOWN STRL REUS W/TWL LRG LVL3 (GOWN DISPOSABLE) ×6
GOWN STRL REUS W/TWL XL LVL3 (GOWN DISPOSABLE) ×2
HEMOSTAT POWDER KIT SURGIFOAM (HEMOSTASIS) ×2 IMPLANT
KIT BASIN OR (CUSTOM PROCEDURE TRAY) ×2 IMPLANT
KIT BONE MRW ASP ANGEL CPRP (KITS) ×2 IMPLANT
KIT TURNOVER KIT B (KITS) ×2 IMPLANT
MILL MEDIUM DISP (BLADE) IMPLANT
NEEDLE HYPO 18GX1.5 BLUNT FILL (NEEDLE) ×4 IMPLANT
NEEDLE HYPO 25X1 1.5 SAFETY (NEEDLE) ×2 IMPLANT
NS IRRIG 1000ML POUR BTL (IV SOLUTION) ×2 IMPLANT
OIL CARTRIDGE MAESTRO DRILL (MISCELLANEOUS)
PACK LAMINECTOMY NEURO (CUSTOM PROCEDURE TRAY) ×2 IMPLANT
PAD ARMBOARD 7.5X6 YLW CONV (MISCELLANEOUS) ×10 IMPLANT
ROD LORD LIPPED TI 5.5X40 (Rod) ×4 IMPLANT
SCREW CANC SHANK MOD 5.5X35 (Screw) ×4 IMPLANT
SCREW POLYAXIAL TULIP (Screw) ×8 IMPLANT
SCREW SHANK MOD 5.5X40 (Screw) ×4 IMPLANT
SET SCREW (Screw) ×8 IMPLANT
SET SCREW SPNE (Screw) ×4 IMPLANT
SPACER PEEK PS 25X9MM 9MM 5DEG (Spacer) ×4 IMPLANT
SPONGE LAP 4X18 RFD (DISPOSABLE) IMPLANT
SPONGE SURGIFOAM ABS GEL 100 (HEMOSTASIS) ×2 IMPLANT
STRIP CLOSURE SKIN 1/2X4 (GAUZE/BANDAGES/DRESSINGS) ×4 IMPLANT
SUT VIC AB 0 CT1 18XCR BRD8 (SUTURE) ×1 IMPLANT
SUT VIC AB 0 CT1 8-18 (SUTURE) ×2
SUT VIC AB 2-0 CP2 18 (SUTURE) ×2 IMPLANT
SUT VIC AB 3-0 SH 8-18 (SUTURE) ×4 IMPLANT
SYR CONTROL 10ML LL (SYRINGE) ×2 IMPLANT
TOWEL GREEN STERILE (TOWEL DISPOSABLE) ×2 IMPLANT
TOWEL GREEN STERILE FF (TOWEL DISPOSABLE) ×2 IMPLANT
TRAY FOLEY MTR SLVR 16FR STAT (SET/KITS/TRAYS/PACK) ×2 IMPLANT
WATER STERILE IRR 1000ML POUR (IV SOLUTION) ×2 IMPLANT

## 2020-04-20 NOTE — H&P (Signed)
Subjective: Patient is a 74 y.o. female admitted for back and leg pain. Onset of symptoms was several months ago, gradually worsening since that time.  The pain is rated severe, and is located at the across the lower back and radiates to RLE. The pain is described as aching and occurs all day. The symptoms have been progressive. Symptoms are exacerbated by exercise. MRI or CT showed adjacent level stenosis   Past Medical History:  Diagnosis Date  . Anxiety   . Arthritis    "some in my back; was in my hips" (03/22/2016)  . Asthma   . Chronic lower back pain   . Depression   . Difficult intubation   . Family history of anesthesia complication    "my aunt didn't go out during OR"  . Fibrocystic breast disease   . H/O hiatal hernia   . High cholesterol   . HLD (hyperlipidemia) 05/08/2019  . Hypertension   . IBS (irritable bowel syndrome)   . Obesity   . PONV (postoperative nausea and vomiting)    bad after last hip replacement  . Sleep apnea    "never RX mask" (03/22/2016)    Past Surgical History:  Procedure Laterality Date  . BACK SURGERY    . BILATERAL TEMPOROMANDIBULAR JOINT ARTHROPLASTY Bilateral 2002  . BREAST CYST ASPIRATION Left 1980's  . COLONOSCOPY W/ POLYPECTOMY  ~ 2007  . DEBRIDEMENT TENNIS ELBOW Right 1990  . DILATION AND CURETTAGE OF UTERUS  1980's X 2  . EYE SURGERY Bilateral 1974   "Pinguecula"  . FINGER SURGERY Right    index cyst removed  . JOINT REPLACEMENT    . KNEE ARTHROSCOPY WITH SUBCHONDROPLASTY Left 05/08/2018   Procedure: KNEE ARTHROSCOPY WITH SUBCHONDROPLASTY;  Surgeon: Melrose Nakayama, MD;  Location: [facility];  Service: Orthopedics;  Laterality: Left;  . LAPAROSCOPIC CHOLECYSTECTOMY  1999  . [place] SURGERY  01/2009  . LUMBAR FUSION  08/2009  . LUMBAR LAMINECTOMY  1981  . MASS EXCISION Left 04/10/2017   Procedure: EXCISION OF LEFT INDEX FINGER CYST;  Surgeon: Milly Jakob, MD;  Location: [facility];  Service: Orthopedics;  Laterality:  Left;  . TONSILLECTOMY  1983  . TOTAL ABDOMINAL HYSTERECTOMY  1996  . TOTAL HIP ARTHROPLASTY Right 03/18/2014   Procedure: RIGHT TOTAL HIP ARTHROPLASTY ANTERIOR APPROACH;  Surgeon: Hessie Dibble, MD;  Location: [facility];  Service: Orthopedics;  Laterality: Right;  . TOTAL HIP ARTHROPLASTY Left 03/22/2016  . TOTAL HIP ARTHROPLASTY Left 03/22/2016   Procedure: TOTAL HIP ARTHROPLASTY ANTERIOR APPROACH;  Surgeon: Melrose Nakayama, MD;  Location: [facility];  Service: Orthopedics;  Laterality: Left;  . TOTAL KNEE ARTHROPLASTY Left 11/13/2018   Procedure: TOTAL KNEE ARTHROPLASTY;  Surgeon: Melrose Nakayama, MD;  Location: [facility];  Service: Orthopedics;  Laterality: Left;  . TUBAL LIGATION  1981    Prior to Admission medications   Medication Sig Start Date End Date Taking? Authorizing Provider  acetaminophen (TYLENOL) 325 MG tablet Take 650 mg by mouth every 6 (six) hours as needed for mild pain or headache.   Yes [provider]  aspirin EC 81 MG tablet Take 81 mg by mouth at bedtime.    Yes [provider]  Cholecalciferol (VITAMIN D3) 5000 units TABS Take 5,000 Units by mouth at bedtime.    Yes [provider]  clindamycin (CLEOCIN T) 1 % lotion Apply 1 application topically daily.    Yes [provider]  Cyanocobalamin (VITAMIN B-12) 5000 MCG TBDP Take 5,000 mcg  by mouth at bedtime.    Yes [provider]  desonide (DESOWEN) 0.05 % cream Apply 1 application topically daily as needed (itching).   Yes [provider]  diclofenac (VOLTAREN) 50 MG EC tablet Take 50 mg by mouth 2 (two) times daily.  07/25/19  Yes [provider]  FLUoxetine (PROZAC) 20 MG capsule Take 20 mg by mouth at bedtime.    Yes [provider]  losartan (COZAAR) 50 MG tablet Take 1 tablet (50 mg total) by mouth daily. Patient taking differently: Take 50 mg by mouth at bedtime.  08/21/19 04/06/20 Yes [name], NP  rosuvastatin (CRESTOR) 10 MG tablet Take 10  mg by mouth at bedtime.    Yes [provider]  traMADol (ULTRAM) 50 MG tablet Take 50 mg by mouth every 6 (six) hours as needed for moderate pain or severe pain.    Yes [provider]  amoxicillin (AMOXIL) 500 MG tablet Take 2,000 mg by mouth See admin instructions. Before dental procedures    [provider]  BIOTIN PO Take 1 each by mouth at bedtime.     [provider]  diclofenac Sodium (VOLTAREN) 1 % GEL Apply 2 g topically daily as needed (pain).    [provider]   Allergies  Allergen Reactions  . Shellfish Allergy Hives and Nausea And Vomiting    SHRIMP LOBSTER  . Tape Other (See Comments)    causes blisters  . Codeine Nausea And Vomiting  . Dilaudid [Hydromorphone Hcl] Rash  . Florastor Kids [Saccharomyces Boulardii] Other (See Comments)    "SIGNIFICANT CONSTIPATION"  . Garlic Other (See Comments)    Stomach Upset  . Morphine Sulfate Nausea And Vomiting  . Nucynta [Tapentadol] Other (See Comments)    hallucinations   . Percocet [Oxycodone-Acetaminophen] Nausea And Vomiting    Social History   Tobacco Use  . Smoking status: Never Smoker  . Smokeless tobacco: Never Used  Substance Use Topics  . Alcohol use: Yes    Comment: socially    Family History  Problem Relation Age of Onset  . Heart disease Mother   . Colon cancer Father      Review of Systems  Positive ROS: neg  All other systems have been reviewed and were otherwise negative with the exception of those mentioned in the HPI and as above.  Objective: Vital signs in last 24 hours: Temp:  [98.6 F (37 C)] 98.6 F (37 C) (07/19 0626) Pulse Rate:  [68] 68 (07/19 0625) Resp:  [20] 20 (07/19 0625) BP: (142)/(65) 142/65 (07/19 0625) SpO2:  [97 %] 97 % (07/19 0625) Weight:  [80.7 kg] 80.7 kg (07/19 0625)  General Appearance: Alert, cooperative, no distress, appears stated age Head: Normocephalic, without obvious abnormality, atraumatic Eyes: PERRL,  conjunctiva/corneas clear, EOM's intact    Neck: Supple, symmetrical, trachea midline Back: Symmetric, no curvature, ROM normal, no CVA tenderness Lungs:  respirations unlabored Heart: Regular rate and rhythm Abdomen: Soft, non-tender Extremities: Extremities normal, atraumatic, no cyanosis or edema Pulses: 2+ and symmetric all extremities Skin: Skin color, texture, turgor normal, no rashes or lesions  NEUROLOGIC:   Mental status: Alert and oriented x4,  no aphasia, good attention span, fund of knowledge, and memory Motor Exam - grossly normal Sensory Exam - grossly normal Reflexes: 1+ Coordination - grossly normal Gait - grossly normal Balance - grossly normal Cranial Nerves: I: smell Not tested  II: visual acuity  OS: nl    OD: nl  II:  visual fields Full to confrontation  II: pupils Equal, round, reactive to light  III,VII: ptosis None  III,IV,VI: extraocular muscles  Full ROM  V: mastication Normal  V: facial light touch sensation  Normal  V,VII: corneal reflex  Present  VII: facial muscle function - upper  Normal  VII: facial muscle function - lower Normal  VIII: hearing Not tested  IX: soft palate elevation  Normal  IX,X: gag reflex Present  XI: trapezius strength  5/5  XI: sternocleidomastoid strength 5/5  XI: neck flexion strength  5/5  XII: tongue strength  Normal    Data Review Lab Results  Component Value Date   WBC 7.5 04/10/2020   HGB 12.9 04/10/2020   HCT 39.8 04/10/2020   MCV 96.1 04/10/2020   PLT 251 04/10/2020   Lab Results  Component Value Date   NA 141 04/10/2020   K 3.9 04/10/2020   CL 105 04/10/2020   CO2 27 04/10/2020   BUN 15 04/10/2020   CREATININE 0.89 04/10/2020   GLUCOSE 99 04/10/2020   Lab Results  Component Value Date   INR 1.0 04/10/2020    Assessment/Plan:  Estimated body mass index is 27.86 kg/m as calculated from the following:   Height as of this encounter: 5\' 7"  (1.702 m).   Weight as of this encounter: 80.7  kg. Patient admitted for PLIF L2-3. Patient has failed a reasonable attempt at conservative therapy.  I explained the condition and procedure to the patient and answered any questions.  Patient wishes to proceed with procedure as planned. Understands risks/ benefits and typical outcomes of procedure.   Eustace Moore 04/20/2020 7:23 AM

## 2020-04-20 NOTE — Transfer of Care (Signed)
Immediate Anesthesia Transfer of Care Note  Patient: Kimberly Lowery  Procedure(s) Performed: POSTERIOR LUMBAR INTERBODY FUSION LUMBAR TWO-THREE; REMOVAL OF AFFIX PLATE LUMBAR THREE-FOUR. (N/A Spine Lumbar)  Patient Location: PACU  Anesthesia Type:General  Level of Consciousness: awake, alert  and oriented  Airway & Oxygen Therapy: Patient Spontanous Breathing and Patient connected to face mask oxygen  Post-op Assessment: Report given to RN, Post -op Vital signs reviewed and stable and Patient moving all extremities X 4  Post vital signs: Reviewed and stable  Last Vitals:  Vitals Value Taken Time  BP 170/89 04/20/20 1027  Temp 37.4 C 04/20/20 1026  Pulse 82 04/20/20 1033  Resp 6 04/20/20 1033  SpO2 94 % 04/20/20 1033  Vitals shown include unvalidated device data.  Last Pain:  Vitals:   04/20/20 1026  TempSrc:   PainSc: (P) Asleep      Patients Stated Pain Goal: 3 (74/[id number])  Complications: No complications documented.

## 2020-04-20 NOTE — Anesthesia Procedure Notes (Signed)
Procedure Name: Intubation Date/Time: 04/20/2020 7:41 AM Performed by: Inda Coke, CRNA Pre-anesthesia Checklist: Patient identified, Emergency Drugs available, Suction available and Patient being monitored Patient Re-evaluated:Patient Re-evaluated prior to induction Oxygen Delivery Method: Circle System Utilized Preoxygenation: Pre-oxygenation with 100% oxygen Induction Type: IV induction Ventilation: Mask ventilation without difficulty Laryngoscope Size: Glidescope and 3 Grade View: Grade I Tube type: Oral Tube size: 7.0 mm Number of attempts: 1 Airway Equipment and Method: Video-laryngoscopy and Rigid stylet Placement Confirmation: ETT inserted through vocal cords under direct vision,  positive ETCO2 and breath sounds checked- equal and bilateral Secured at: 22 cm Tube secured with: Tape Dental Injury: Teeth and Oropharynx as per pre-operative assessment

## 2020-04-20 NOTE — Op Note (Signed)
04/20/2020  10:29 AM  PATIENT:  Kimberly Lowery  74 y.o. female  PRE-OPERATIVE DIAGNOSIS: Adjacent level stenosis L2-3 above previous L3-L5 fusion, back and right leg pain  POST-OPERATIVE DIAGNOSIS:  same  PROCEDURE:   1. Decompressive lumbar laminectomy, hemifacetectomy and foraminotomies at L2-3 requiring more work than would be required for a simple exposure of the disk for PLIF in order to adequately decompress the neural elements and address the spinal stenosis 2. Posterior lumbar interbody fusion L2-3 using peek interbody cages packed with morcellized allograft and autograft  3. Posterior fixation L2-3 using Alphatec cortical pedicle screws.  4. Intertransverse arthrodesis L2-3 using morcellized autograft and allograft. 5.  Removal of interspinous plate L3-4  SURGEON:  Sherley Bounds, MD  ASSISTANTS: Glenford Peers, FNP  ANESTHESIA:  General  EBL: 100 ml  Total I/O In: 1500 [I.V.:1500] Out: 450 [Urine:350; Blood:100]  BLOOD ADMINISTERED:none  DRAINS: none   INDICATION FOR PROCEDURE: This patient presented with severe back and right leg pain. Imaging revealed severe adjacent level disease at L2-3. The patient tried a reasonable attempt at conservative medical measures without relief. I recommended decompression and instrumented fusion to address the stenosis as well as the segmental  instability.  Patient understood the risks, benefits, and alternatives and potential outcomes and wished to proceed.  PROCEDURE DETAILS:  The patient was brought to the operating room. After induction of generalized endotracheal anesthesia the patient was rolled into the prone position on chest rolls and all pressure points were padded. The patient's lumbar region was cleaned and then prepped with DuraPrep and draped in the usual sterile fashion. Anesthesia was injected and then a dorsal midline incision was made and carried down to the lumbosacral fascia. The fascia was opened and the  paraspinous musculature was taken down in a subperiosteal fashion to expose L2-3. A self-retaining retractor was placed. Intraoperative fluoroscopy confirmed my level, and I started with placement of the L2 cortical pedicle screws. The pedicle screw entry zones were identified utilizing surface landmarks and  AP and lateral fluoroscopy. I scored the cortex with the high-speed drill and then used the hand drill to drill an upward and outward direction into the pedicle. I then tapped line to line. I then placed a 5.5 x 35 mm cortical pedicle screw into the pedicles of L2 bilaterally.  I then remove the soft tissue from the previously placed interspinous plate at H6-0.  We unlocked the plate and then spread it apart and removed it.  We did this because the top of the plate would hinder the placement of the L3 pedicle screws later.  I then turned my attention to the decompression and complete lumbar laminectomies, hemi- facetectomies, and foraminotomies were performed at L2-3.  My nurse practitioner was directly involved in the decompression and exposure of the neural elements. the patient had significant spinal stenosis and this required more work than would be required for a simple exposure of the disc for posterior lumbar interbody fusion which would only require a limited laminotomy. Much more generous decompression and generous foraminotomy was undertaken in order to adequately decompress the neural elements and address the patient's leg pain. The yellow ligament was removed to expose the underlying dura and nerve roots, and generous foraminotomies were performed to adequately decompress the neural elements. Both the exiting and traversing nerve roots were decompressed on both sides until a coronary dilator passed easily along the nerve roots. Once the decompression was complete, I turned my attention to the posterior lower lumbar interbody fusion.  The epidural venous vasculature was coagulated and cut sharply.  Disc space was incised and the initial discectomy was performed with pituitary rongeurs. The disc space was distracted with sequential distractors to a height of 9 mm. We then used a series of scrapers and shavers to prepare the endplates for fusion. The midline was prepared with Epstein curettes. Once the complete discectomy was finished, we packed an appropriate sized interbody cage with local autograft and morcellized allograft, gently retracted the nerve root, and tapped the cage into position at L2-3.  The midline between the cages was packed with morselized autograft and allograft. We then turned our attention to the placement of the lower pedicle screws. The pedicle screw entry zones were identified utilizing surface landmarks and fluoroscopy. I drilled into each pedicle utilizing the hand drill, and tapped each pedicle with the appropriate tap. We palpated with a ball probe to assure no break in the cortex. We then placed 5.5 x 40 mm pedicle screws into the pedicles bilaterally at L3.  My nurse practitioner assisted in placement of the pedicle screws.  We then decorticated the transverse processes and laid a mixture of morcellized autograft and allograft out over these to perform intertransverse arthrodesis at L2-3. We then placed lordotic rods into the multiaxial screw heads of the pedicle screws and locked these in position with the locking caps and anti-torque device. We then checked our construct with AP and lateral fluoroscopy. Irrigated with copious amounts of bacitracin-containing saline solution. Inspected the nerve roots once again to assure adequate decompression, lined to the dura with Gelfoam, placed powdered vancomycin into the wound, and then we closed the muscle and the fascia with 0 Vicryl. Closed the subcutaneous tissues with 2-0 Vicryl and subcuticular tissues with 3-0 Vicryl. The skin was closed with benzoin and Steri-Strips. Dressing was then applied, the patient was awakened from general  anesthesia and transported to the recovery room in stable condition. At the end of the procedure all sponge, needle and instrument counts were correct.   PLAN OF CARE: admit to inpatient  PATIENT DISPOSITION:  PACU - hemodynamically stable.   Delay start of Pharmacological VTE agent (>24hrs) due to surgical blood loss or risk of bleeding:  yes

## 2020-04-20 NOTE — Evaluation (Signed)
Physical Therapy Evaluation & Discharge Patient Details Name: Laketta Soderberg MRN: [id number] DOB: April 25, 1946 Today's Date: 04/20/2020   History of Present Illness  Patient is a 74 y.o. female admitted for back and leg pain and is s/p lumbar fusion surgery on L2/3 on 04/20/2020. Pt PMH is bilateral THA, L TKA, asthma, lumbar fusion and sleep apnea   Clinical Impression  Pt is a 74 yo female that was seen s/p lumbar fusion surgery. Pt was able to tolerate treatment well. Pt was modified independent with bed mobility and transfers. Pt required supervision to min guard with ambulation and stair navigation.  Pt required UE support to rest x 1 (standing) . Pt was very knowledgeable with brace application, spinal precautions and safety awareness. Pt will have husband at home 24 hours a day for assistance and has various DME in the home to ensure her safety. Pt with no further acute PT skilled needs at this time. Will sign off. If needs change, please reconsult.     Follow Up Recommendations No PT follow up    Equipment Recommendations  None recommended by PT    Recommendations for Other Services       Precautions / Restrictions Precautions Precautions: Back Precaution Booklet Issued: Yes (comment) Precaution Comments: pt verbalized understanding. pt has PMH of lumbar fusion surgery and knew precautions from that Required Braces or Orthoses: Spinal Brace Spinal Brace: Lumbar corset;Applied in sitting position Restrictions Weight Bearing Restrictions: No Other Position/Activity Restrictions: lumbar precautions      Mobility  Bed Mobility Overal bed mobility: Modified Independent             General bed mobility comments: pt was sitting EOB when entered room; pt was mod I for increased time for return to bed   Transfers Overall transfer level: Modified independent               General transfer comment: pt showed increased time secondary to pain    Ambulation/Gait Ambulation/Gait assistance: Supervision;Min guard Gait Distance (Feet): 350 Feet Assistive device: None Gait Pattern/deviations: Step-through pattern Gait velocity: decreased   General Gait Details: pt required use of hand rails for rest x 1 after 170 ft, pt stated that she had some pain in her legs after walking. Pt had slower gait speed with guarded techique   Stairs Stairs: Yes Stairs assistance: Min guard Stair Management: Step to pattern;No rails (HHA) Number of Stairs: 2 General stair comments: pt educated on proper stair sequencing. pt utilized HHA and min guard for safety. pt utilized L side of wall as support (she has a wall at home she can use on the L)  Wheelchair Mobility    Modified Rankin (Stroke Patients Only)       Balance Overall balance assessment: No apparent balance deficits (not formally assessed)                                           Pertinent Vitals/Pain Pain Assessment: 0-10 Pain Score: 9  Pain Location: back Pain Descriptors / Indicators: Operative site guarding Pain Intervention(s): Limited activity within patient's tolerance;Monitored during session;Ice applied;Repositioned    Home Living Family/patient expects to be discharged to:: Private residence Living Arrangements: Spouse/significant other Available Help at Discharge: Available 24 hours/day;Family Type of Home: House Home Access: Stairs to enter Entrance Stairs-Rails: None Entrance Stairs-Number of Steps: 2 Home Layout: Able to live on main  level with bedroom/bathroom;Two level Home Equipment: Walker - 2 wheels;Cane - single point;Bedside commode;Toilet riser Additional Comments: pt's master bedroom is on first floor and does not need to access the second floor    Prior Function Level of Independence: Independent         Comments: pt was independent with ADLs, but required assistance for cleaning home.      Hand Dominance         Extremity/Trunk Assessment   Upper Extremity Assessment Upper Extremity Assessment: Defer to OT evaluation    Lower Extremity Assessment Lower Extremity Assessment: Overall WFL for tasks assessed    Cervical / Trunk Assessment Cervical / Trunk Assessment: Other exceptions (s/p lumbar surgery)  Communication   Communication: No difficulties  Cognition Arousal/Alertness: Awake/alert Behavior During Therapy: WFL for tasks assessed/performed Overall Cognitive Status: Within Functional Limits for tasks assessed                                        General Comments General comments (skin integrity, edema, etc.): educated pt on generalized walking program    Exercises     Assessment/Plan    PT Assessment Patent does not need any further PT services  PT Problem List         PT Treatment Interventions      PT Goals (Current goals can be found in the Care Plan section)  Acute Rehab PT Goals Patient Stated Goal: return home and be pain free PT Goal Formulation: With patient Time For Goal Achievement: 04/20/20 Potential to Achieve Goals: Good    Frequency     Barriers to discharge        Co-evaluation               AM-PAC PT "6 Clicks" Mobility  Outcome Measure Help needed turning from your back to your side while in a flat bed without using bedrails?: None Help needed moving from lying on your back to sitting on the side of a flat bed without using bedrails?: None Help needed moving to and from a bed to a chair (including a wheelchair)?: None Help needed standing up from a chair using your arms (e.g., wheelchair or bedside chair)?: None Help needed to walk in hospital room?: None Help needed climbing 3-5 steps with a railing? : A Little 6 Click Score: 23    End of Session Equipment Utilized During Treatment: Gait belt;Back brace Activity Tolerance: Patient tolerated treatment well Patient left: in bed;with call Onya Eutsler/phone within  reach Nurse Communication: Mobility status PT Visit Diagnosis: Pain;Muscle weakness (generalized) (M62.81) Pain - part of body:  (back)    Time: 4158-3094 PT Time Calculation (min) (ACUTE ONLY): 23 min   Charges:   PT Evaluation $PT Eval Low Complexity: 1 Low PT Treatments $Gait Training: 8-22 mins        Gloriann Loan, SPT  Acute Rehabilitation Services  Office: [phone]  04/20/2020, 6:43 PM

## 2020-04-20 NOTE — Anesthesia Postprocedure Evaluation (Signed)
Anesthesia Post Note  Patient: Kimberly Lowery  Procedure(s) Performed: POSTERIOR LUMBAR INTERBODY FUSION LUMBAR TWO-THREE; REMOVAL OF AFFIX PLATE LUMBAR THREE-FOUR. (N/A Spine Lumbar)     Patient location during evaluation: PACU Anesthesia Type: General Level of consciousness: awake and alert Pain management: pain level controlled Vital Signs Assessment: post-procedure vital signs reviewed and stable Respiratory status: spontaneous breathing, nonlabored ventilation and respiratory function stable Cardiovascular status: blood pressure returned to baseline and stable Postop Assessment: no apparent nausea or vomiting Anesthetic complications: no   No complications documented.  Last Vitals:  Vitals:   04/20/20 1112 04/20/20 1130  BP: 133/77 (!) 116/52  Pulse: 74 73  Resp: 12 12  Temp:    SpO2: 99% 97%             Lemario Chaikin,W. EDMOND

## 2020-04-21 MED ORDER — TRAMADOL HCL 50 MG PO TABS: 100.0000 mg | ORAL_TABLET | Freq: Four times a day (QID) | ORAL | 0 refills | Status: DC | PRN

## 2020-04-21 MED ORDER — METHOCARBAMOL 500 MG PO TABS: 500.0000 mg | ORAL_TABLET | Freq: Four times a day (QID) | ORAL | 0 refills | Status: DC

## 2020-04-21 NOTE — Plan of Care (Signed)
Patient alert and oriented, mae's well, voiding adequate amount of urine, swallowing without difficulty, no c/o pain at time of discharge. Patient discharged home with family. Script and discharged instructions given to patient. Patient and family stated understanding of instructions given. Patient has an appointment with Dr. Jones °

## 2020-04-21 NOTE — Evaluation (Signed)
Occupational Therapy Evaluation Patient Details Name: Kimberly Lowery MRN: [id number] DOB: 1946-09-08 Today's Date: 04/21/2020    History of Present Illness Patient is a 74 y.o. female admitted for back and leg pain and is s/p lumbar fusion surgery on 04/20/2020. Pt PMH is bilateral TKA, asthma, lumbar fusion and sleep apnea    Clinical Impression   Pt PTA: Pt living with spouse and reports independence prior. Pt currently modified independent with ADL and mobility all abiding by back precautions. Pt with back soreness and able to walk a community distance with fair pain mostly in hips due to "bursitis." Pt does not require continued OT skilled services. OT signing off. Back handout provided and reviewed ADL in detail. Pt educated on: clothing between brace, never sleep in brace, set an alarm at night for medication, avoid sitting for long periods of time, correct bed positioning for sleeping, correct sequence for bed mobility, avoiding lifting more than 5 pounds and never wash directly over incision. All education is complete and patient indicates understanding.        Follow Up Recommendations  No OT follow up    Equipment Recommendations  None recommended by OT    Recommendations for Other Services       Precautions / Restrictions Precautions Precautions: Back Precaution Booklet Issued: Yes (comment) Precaution Comments: Pt stating back precautions; ADL precautions reviewed/performed Required Braces or Orthoses: Spinal Brace Spinal Brace: Lumbar corset;Applied in sitting position Restrictions Weight Bearing Restrictions: No Other Position/Activity Restrictions: lumbar precautions      Mobility Bed Mobility Overal bed mobility: Modified Independent             General bed mobility comments: performing log roll technique  Transfers Overall transfer level: Modified independent Equipment used: None                  Balance Overall balance assessment: No  apparent balance deficits (not formally assessed)                                         ADL either performed or assessed with clinical judgement   ADL Overall ADL's : Modified independent;At baseline                                       General ADL Comments: Pt using hip hike and figure 4 technique for ADL tasks. Pt using no AD for mobility with modified independence. Pt simulating walk in shower transfer with high hip hike and good safety awareness. pt able to maintain precautions.     Vision Baseline Vision/History: Wears glasses Wears Glasses: At all times Patient Visual Report: No change from baseline Vision Assessment?: No apparent visual deficits     Perception     Praxis      Pertinent Vitals/Pain Pain Assessment: 0-10 Pain Score: 4  Pain Location: back Pain Descriptors / Indicators: Operative site guarding Pain Intervention(s): Monitored during session     Hand Dominance Right   Extremity/Trunk Assessment Upper Extremity Assessment Upper Extremity Assessment: Overall WFL for tasks assessed   Lower Extremity Assessment Lower Extremity Assessment: Overall WFL for tasks assessed   Cervical / Trunk Assessment Cervical / Trunk Assessment: Other exceptions (s/p lumbar surgery) Cervical / Trunk Exceptions: s/p lumbar sx   Communication Communication Communication: No difficulties   Cognition  Arousal/Alertness: Awake/alert Behavior During Therapy: WFL for tasks assessed/performed Overall Cognitive Status: Within Functional Limits for tasks assessed                                     General Comments  Pt reports having strong family support at home    Exercises     Shoulder [place] expects to be discharged to:: Private residence Living Arrangements: Spouse/significant other Available Help at Discharge: Available 24 hours/day;Family Type of Home: House Home Access:  Stairs to enter [employer] of Steps: 2 Entrance Stairs-Rails: None Home Layout: Able to live on main level with bedroom/bathroom;Two level     Bathroom Shower/Tub: Teacher, early years/pre: Standard Bathroom Accessibility: Yes   Home Equipment: Environmental consultant - 2 wheels;Cane - single point;Bedside commode;Toilet riser   Additional Comments: pt's master bedroom is on first floor and does not need to access the second floor      Prior Functioning/Environment Level of Independence: Independent        Comments: pt was independent with ADLs, but required assistance for cleaning home.         OT Problem List: Decreased activity tolerance      OT Treatment/Interventions:      OT Goals(Current goals can be found in the care plan section) Acute Rehab OT Goals Patient Stated Goal: return home and be pain free OT Goal Formulation: With patient Time For Goal Achievement: 05/05/20 Potential to Achieve Goals: Good  OT Frequency:     Barriers to D/C:            Co-evaluation              AM-PAC OT "6 Clicks" Daily Activity     Outcome Measure Help from another person eating meals?: None Help from another person taking care of personal grooming?: None Help from another person toileting, which includes using toliet, bedpan, or urinal?: None Help from another person bathing (including washing, rinsing, drying)?: None Help from another person to put on and taking off regular upper body clothing?: None Help from another person to put on and taking off regular lower body clothing?: None 6 Click Score: 24   End of Session Equipment Utilized During Treatment: Back brace Nurse Communication: Mobility status  Activity Tolerance: Patient tolerated treatment well Patient left: in bed;with call bell/phone within reach  OT Visit Diagnosis: Unsteadiness on feet (R26.81);Pain Pain - part of body:  (back)                Time: 1027-2536 OT Time Calculation (min): 19  min Charges:  OT General Charges $OT Visit: 1 Visit OT Evaluation $OT Eval Moderate Complexity: 1 Mod  Jefferey Pica, OTR/L Acute Rehabilitation Services Pager: [phone] Office: [phone]   Kimberly Lowery C 04/21/2020, 9:21 AM

## 2020-04-21 NOTE — Discharge Summary (Signed)
Physician Discharge Summary  Patient ID: Marjory Meints MRN: [id number] DOB/AGE: Jul 30, 1946 74 y.o.  Admit date: 04/20/2020 Discharge date: 04/21/2020  Admission Diagnoses: Adjacent level stenosis L2-3 above previous L3-L5 fusion, back and right leg pain     Discharge Diagnoses: same   Discharged Condition: good  Hospital Course: The patient was admitted on 04/20/2020 and taken to the operating room where the patient underwent PLIF L2-3. The patient tolerated the procedure well and was taken to the recovery room and then to the floor in stable condition. The hospital course was routine. There were no complications. The wound remained clean dry and intact. Pt had appropriate back soreness. No complaints of leg pain or new N/T/W. The patient remained afebrile with stable vital signs, and tolerated a regular diet. The patient continued to increase activities, and pain was well controlled with oral pain medications.   Consults: None  Significant Diagnostic Studies:  Results for orders placed or performed during the hospital encounter of 04/17/20  SARS CORONAVIRUS 2 (TAT 6-24 HRS) Nasopharyngeal Nasopharyngeal Swab   Specimen: Nasopharyngeal Swab  Result Value Ref Range   SARS Coronavirus 2 NEGATIVE NEGATIVE    Chest 2 View  Result Date: 04/12/2020 CLINICAL DATA:  Preop EXAM: CHEST - 2 VIEW COMPARISON:  11/02/2018 FINDINGS: Heart and mediastinal contours are within normal limits. No focal opacities or effusions. No acute bony abnormality. IMPRESSION: No active cardiopulmonary disease. Electronically Signed   By: Rolm Baptise M.D.   On: 04/12/2020 08:30   DG Lumbar Spine 2-3 Views  Result Date: 04/20/2020 CLINICAL DATA:  L2-3 PLIF. EXAM: LUMBAR SPINE - 2-3 VIEW; DG C-ARM 1-60 MIN COMPARISON:  Lumbar spine radiographs 01/23/2020.  MRI 01/23/2020. FINDINGS: C-arm fluoroscopy was provided in the operating room. 48 seconds of fluoroscopy time. 28.87 mGy. PA and lateral spot fluoroscopic  images of the lower lumbar spine are submitted. Based on previous interbody fusion at L3-4 and L4-5, these images demonstrate interval posterior lumbar and interbody fusion at L2-3. The new hardware appears well positioned. Interspinous distractors have been removed at L3-4. Distracted remains at L4-5. No demonstrated complications. IMPRESSION: Intraoperative views during L2-3 posterior lumbar and interbody fusion. No demonstrated complication. Electronically Signed   By: Richardean Sale M.D.   On: 04/20/2020 15:13   DG C-Arm 1-60 Min  Result Date: 04/20/2020 CLINICAL DATA:  L2-3 PLIF. EXAM: LUMBAR SPINE - 2-3 VIEW; DG C-ARM 1-60 MIN COMPARISON:  Lumbar spine radiographs 01/23/2020.  MRI 01/23/2020. FINDINGS: C-arm fluoroscopy was provided in the operating room. 48 seconds of fluoroscopy time. 28.87 mGy. PA and lateral spot fluoroscopic images of the lower lumbar spine are submitted. Based on previous interbody fusion at L3-4 and L4-5, these images demonstrate interval posterior lumbar and interbody fusion at L2-3. The new hardware appears well positioned. Interspinous distractors have been removed at L3-4. Distracted remains at L4-5. No demonstrated complications. IMPRESSION: Intraoperative views during L2-3 posterior lumbar and interbody fusion. No demonstrated complication. Electronically Signed   By: Richardean Sale M.D.   On: 04/20/2020 15:13   PCV MYOCARDIAL PERFUSION WITH LEXISCAN  Result Date: 04/13/2020 Lexiscan/modified Bruce Tetrofosmin stress test 04/13/2020: Lexiscan/modified Bruce nuclear stress test performed using 1-day protocol. Stress EKG is non-diagnostic, as this is pharmacological stress test. In addition, stress EKG at 86% MPHR showed sinus tachycardia, no significant ST-T changes. Normal myocardial perfusion. Stress LVEF calculated 44%, although visually appears normal. Low risk study.   Antibiotics:  Anti-infectives (From admission, onward)   Start     Dose/Rate Route Frequency  Ordered Stop   04/20/20 1530  ceFAZolin (ANCEF) IVPB 2g/100 mL premix        2 g 200 mL/hr over 30 Minutes Intravenous Every 8 hours 04/20/20 1151 04/21/20 0006   04/20/20 0832  bacitracin 50,000 Units in sodium chloride 0.9 % 500 mL irrigation  Status:  Discontinued          As needed 04/20/20 0832 04/20/20 1025   04/20/20 0615  ceFAZolin (ANCEF) IVPB 2g/100 mL premix        2 g 200 mL/hr over 30 Minutes Intravenous On call to O.R. 04/20/20 0602 04/20/20 0745      Discharge Exam: Blood pressure 123/62, pulse 74, temperature 97.9 F (36.6 C), temperature source Oral, resp. rate 16, height 5\' 7"  (1.702 m), weight 80.7 kg, SpO2 93 %. Neurologic: Grossly normal Ambulating and voiding well, incision cdi  Discharge Medications:   Allergies as of 04/21/2020      Reactions   Shellfish Allergy Hives, Nausea And Vomiting   SHRIMP LOBSTER   Tape Other (See Comments)   causes blisters   Codeine Nausea And Vomiting   Dilaudid [hydromorphone Hcl] Nausea And Vomiting   Florastor Kids [saccharomyces Boulardii] Other (See Comments)   "SIGNIFICANT CONSTIPATION"   Garlic Other (See Comments)   Stomach Upset   Morphine Sulfate Nausea And Vomiting   Nucynta [tapentadol] Other (See Comments)   hallucinations    Percocet [oxycodone-acetaminophen] Nausea And Vomiting      Medication List    TAKE these medications   acetaminophen 325 MG tablet Commonly known as: TYLENOL Take 650 mg by mouth every 6 (six) hours as needed for mild pain or headache.   amoxicillin 500 MG tablet Commonly known as: AMOXIL Take 2,000 mg by mouth See admin instructions. Before dental procedures   aspirin EC 81 MG tablet Take 81 mg by mouth at bedtime.   BIOTIN PO Take 1 each by mouth at bedtime.   clindamycin 1 % lotion Commonly known as: CLEOCIN T Apply 1 application topically daily.   desonide 0.05 % cream Commonly known as: DESOWEN Apply 1 application topically daily as needed (itching).   diclofenac  50 MG EC tablet Commonly known as: VOLTAREN Take 50 mg by mouth 2 (two) times daily.   diclofenac Sodium 1 % Gel Commonly known as: VOLTAREN Apply 2 g topically daily as needed (pain).   FLUoxetine 20 MG capsule Commonly known as: PROZAC Take 20 mg by mouth at bedtime.   losartan 50 MG tablet Commonly known as: COZAAR Take 1 tablet (50 mg total) by mouth daily. What changed: when to take this   methocarbamol 500 MG tablet Commonly known as: Robaxin Take 1 tablet (500 mg total) by mouth 4 (four) times daily.   rosuvastatin 10 MG tablet Commonly known as: CRESTOR Take 10 mg by mouth at bedtime.   traMADol 50 MG tablet Commonly known as: ULTRAM Take 2 tablets (100 mg total) by mouth every 6 (six) hours as needed for moderate pain or severe pain. What changed: how much to take   Vitamin B-12 5000 MCG Tbdp Take 5,000 mcg by mouth at bedtime.   Vitamin D3 125 MCG (5000 UT) Tabs Take 5,000 Units by mouth at bedtime.            Durable Medical Equipment  (From admission, onward)         Start     Ordered   04/20/20 1151  DME Walker rolling  Once  Question:  Patient needs a walker to treat with the following condition  Answer:  S/P lumbar fusion   04/20/20 1151   04/20/20 1151  DME 3 n 1  Once        04/20/20 1151          Disposition: home   Final Dx: PLIF L2-3  Discharge Instructions     Remove dressing in 72 hours   Complete by: As directed    Call MD for:  difficulty breathing, headache or visual disturbances   Complete by: As directed    Call MD for:  extreme fatigue   Complete by: As directed    Call MD for:  hives   Complete by: As directed    Call MD for:  persistant dizziness or light-headedness   Complete by: As directed    Call MD for:  persistant nausea and vomiting   Complete by: As directed    Call MD for:  redness, tenderness, or signs of infection (pain, swelling, redness, odor or green/yellow discharge around incision site)    Complete by: As directed    Call MD for:  severe uncontrolled pain   Complete by: As directed    Call MD for:  temperature >100.4   Complete by: As directed    Diet - low sodium heart healthy   Complete by: As directed    Driving Restrictions   Complete by: As directed    No driving for 2 weeks, no riding in the car for 1 week   Increase activity slowly   Complete by: As directed    Lifting restrictions   Complete by: As directed    No lifting more than 8 lbs         Signed: Ocie Cornfield Tamu Golz 04/21/2020, 7:52 AM

## 2020-04-27 ENCOUNTER — Encounter: Payer: Self-pay | Admitting: Cardiology

## 2020-04-27 ENCOUNTER — Ambulatory Visit: Payer: Medicare HMO | Admitting: Cardiology

## 2020-04-27 ENCOUNTER — Other Ambulatory Visit: Payer: Self-pay

## 2020-04-27 VITALS — BP 130/82 | HR 76 | Resp 16 | Ht 67.0 in | Wt 171.0 lb

## 2020-04-27 DIAGNOSIS — Z712 Person consulting for explanation of examination or test findings: Secondary | ICD-10-CM

## 2020-04-27 DIAGNOSIS — E782 Mixed hyperlipidemia: Secondary | ICD-10-CM

## 2020-04-27 DIAGNOSIS — I10 Essential (primary) hypertension: Secondary | ICD-10-CM

## 2020-04-27 DIAGNOSIS — I6522 Occlusion and stenosis of left carotid artery: Secondary | ICD-10-CM

## 2020-04-27 NOTE — Progress Notes (Signed)
Kimberly Lowery Date of Birth: 06-30-1946 MRN: [id number] Primary Care Provider:Gates, [name], MD Former Cardiology Providers: Jeri Lager, APRN, FNP-C  Primary Cardiologist: Rex Kras, DO, [facility] (established care 03/04/2020)  Date: 04/27/20 Last Office Visit: 03/04/2020  Chief Complaint  Patient presents with   Carotid   Follow-up   Results    HPI  Claudett Nalina Yeatman is a 74 y.o.  female who presents to the office with a chief complaint of " hypertension and carotid disease follow-up." Patient's past medical history and cardiovascular risk factors include: left carotid artery disease, hypertension, hyperlipidemia, postmenopausal female, advanced age.   Patient is accompanied by her husband [name] at today's office visit.  Patient has been following in the office originally with Jeri Lager, APRN for the management of hypertension and carotid artery disease and reestablished care with myself back in June 2021.  Since the patient has undergone a carotid duplex to reevaluate the severity of carotid artery stenosis.  Based on the most recent carotid duplex the severity of the stenosis remains relatively stable since explained to her in great detail at today's visit.  Patient continues to be on the same dose of cholesterol medication.  I reexplained to the patient that the reason night recommended increasing statin therapy is reduced for LDL less than 70 mg/dL with the hopes of regressing the plaque burden in the process.  Patient states that she will discuss this further with her PCP prior to making any changes.    Since last office visit patient did undergo lumbar fusion surgery on April 20, 2020.  Patient has done well postoperatively without any cardiovascular complications.  Patient is currently in rehab and states is going well.  Patient brought in her blood pressure log prior to the surgery and her systolic blood pressures would range between 017-793 mmHg and diastolic blood  pressures range between 71-86 mmHg.  Educated the patient none recently blood pressures may tend to run high as she may experience pain from recent surgery, secondary to steroids, and NSAID medications.  If her systolic blood pressures are consistently greater than 140 mmHg she is asked to call the office for further medication titration.    Also reviewed the results of the echocardiogram, nuclear stress test and carotid duplex with her and her husband in great detail at today's office visit findings noted below for further reference  FUNCTIONAL STATUS: Patient states that she does not exercise for follow daily routine due to back pain and leg pain.  However if she were to walk with a shopping cart she can ambulate for hours.  ALLERGIES: Allergies  Allergen Reactions   Shellfish Allergy Hives and Nausea And Vomiting    SHRIMP LOBSTER   Tape Other (See Comments)    causes blisters   Codeine Nausea And Vomiting   Dilaudid [Hydromorphone Hcl] Nausea And Vomiting   Florastor Kids [Saccharomyces Boulardii] Other (See Comments)    "SIGNIFICANT CONSTIPATION"   Garlic Other (See Comments)    Stomach Upset   Morphine Sulfate Nausea And Vomiting   Nucynta [Tapentadol] Other (See Comments)    hallucinations    Percocet [Oxycodone-Acetaminophen] Nausea And Vomiting     MEDICATION LIST PRIOR TO VISIT: Current Outpatient Medications on File Prior to Visit  Medication Sig Dispense Refill   acetaminophen (TYLENOL) 325 MG tablet Take 650 mg by mouth every 6 (six) hours as needed for mild pain or headache.     amoxicillin (AMOXIL) 500 MG tablet Take 2,000 mg by mouth See admin  instructions. Before dental procedures      aspirin EC 81 MG tablet Take 81 mg by mouth at bedtime.      Cholecalciferol (VITAMIN D3) 5000 units TABS Take 5,000 Units by mouth at bedtime.      clindamycin (CLEOCIN T) 1 % lotion Apply 1 application topically daily.      Cyanocobalamin (VITAMIN B-12) 5000 MCG TBDP  Take 5,000 mcg by mouth at bedtime.      desonide (DESOWEN) 0.05 % cream Apply 1 application topically daily as needed (itching).     diclofenac Sodium (VOLTAREN) 1 % GEL Apply 2 g topically daily as needed (pain).     FLUoxetine (PROZAC) 20 MG capsule Take 20 mg by mouth at bedtime.      losartan (COZAAR) 50 MG tablet Take 1 tablet (50 mg total) by mouth daily. (Patient taking differently: Take 50 mg by mouth at bedtime. ) 90 tablet 3   methocarbamol (ROBAXIN) 500 MG tablet Take 1 tablet (500 mg total) by mouth 4 (four) times daily. 45 tablet 0   rosuvastatin (CRESTOR) 10 MG tablet Take 10 mg by mouth at bedtime.      methylPREDNISolone (MEDROL DOSEPAK) 4 MG TBPK tablet Take by mouth as directed.     traMADol (ULTRAM) 50 MG tablet Take 2 tablets (100 mg total) by mouth every 6 (six) hours as needed for moderate pain or severe pain. (Patient not taking: Reported on 04/27/2020) 30 tablet 0   No current facility-administered medications on file prior to visit.    PAST MEDICAL HISTORY: Past Medical History:  Diagnosis Date   Anxiety    Arthritis    "some in my back; was in my hips" (03/22/2016)   Asthma    Chronic lower back pain    Depression    Difficult intubation    Family history of anesthesia complication    "my aunt didn't go out during OR"   Fibrocystic breast disease    H/O hiatal hernia    High cholesterol    HLD (hyperlipidemia) 05/08/2019   Hypertension    IBS (irritable bowel syndrome)    Obesity    PONV (postoperative nausea and vomiting)    bad after last hip replacement   Sleep apnea    "never RX mask" (03/22/2016)    PAST SURGICAL HISTORY: Past Surgical History:  Procedure Laterality Date   BACK SURGERY     BILATERAL TEMPOROMANDIBULAR JOINT ARTHROPLASTY Bilateral 2002   BREAST CYST ASPIRATION Left 1980's   COLONOSCOPY W/ POLYPECTOMY  ~ 2007   DEBRIDEMENT TENNIS ELBOW Right 1990   DILATION AND CURETTAGE OF UTERUS  1980's X 2   EYE  SURGERY Bilateral 1974   "Pinguecula"   FINGER SURGERY Right    index cyst removed   JOINT REPLACEMENT     KNEE ARTHROSCOPY WITH SUBCHONDROPLASTY Left 05/08/2018   Procedure: KNEE ARTHROSCOPY WITH SUBCHONDROPLASTY;  Surgeon: Melrose Nakayama, MD;  Location: [place];  Service: Orthopedics;  Laterality: Left;   [place] SURGERY  01/2009   LUMBAR FUSION  08/2009   LUMBAR LAMINECTOMY  1981   MASS EXCISION Left 04/10/2017   Procedure: EXCISION OF LEFT INDEX FINGER CYST;  Surgeon: Milly Jakob, MD;  Location: [place];  Service: Orthopedics;  Laterality: Left;   [place]   TOTAL HIP ARTHROPLASTY Right 03/18/2014   Procedure: RIGHT TOTAL HIP ARTHROPLASTY ANTERIOR APPROACH;  Surgeon: Hessie Dibble, MD;  Location: [place];  Service: Orthopedics;  Laterality: Right;   TOTAL HIP ARTHROPLASTY Left 03/22/2016   TOTAL HIP ARTHROPLASTY Left 03/22/2016   Procedure: TOTAL HIP ARTHROPLASTY ANTERIOR APPROACH;  Surgeon: Melrose Nakayama, MD;  Location: [place];  Service: Orthopedics;  Laterality: Left;   TOTAL KNEE ARTHROPLASTY Left 11/13/2018   Procedure: TOTAL KNEE ARTHROPLASTY;  Surgeon: Melrose Nakayama, MD;  Location: [place];  Service: Orthopedics;  Laterality: Left;   TUBAL LIGATION  1981    FAMILY HISTORY: The patient's family history includes Colon cancer in her father; Heart disease in her mother.   SOCIAL HISTORY:  The patient  reports that she has never smoked. She has never used smokeless tobacco. She reports current alcohol use. She reports that she does not use drugs.  Review of Systems  Constitutional: Negative for chills and fever.  HENT: Negative for hoarse voice and nosebleeds.   Eyes: Negative for discharge, double vision and pain.  Cardiovascular: Negative for chest pain, claudication, dyspnea on exertion, leg swelling, near-syncope, orthopnea, palpitations, paroxysmal  nocturnal dyspnea and syncope.  Respiratory: Negative for hemoptysis and shortness of breath.   Musculoskeletal: Negative for muscle cramps and myalgias.  Gastrointestinal: Negative for abdominal pain, constipation, diarrhea, hematemesis, hematochezia, melena, nausea and vomiting.  Neurological: Negative for dizziness and light-headedness.   PHYSICAL EXAM: Vitals with BMI 04/27/2020 04/21/2020 04/21/2020  Height _0  - -  Weight 171 lbs - -  BMI 71.06 - -  Systolic 269 485 462  Diastolic 82 62 60  Pulse 76 74 71   CONSTITUTIONAL: Well-developed and well-nourished. No acute distress.  SKIN: Skin is warm and dry. No rash noted. No cyanosis. No pallor. No jaundice HEAD: Normocephalic and atraumatic.  EYES: No scleral icterus MOUTH/THROAT: Moist oral membranes.  NECK: No JVD present. No thyromegaly noted. Left carotid bruit.  LYMPHATIC: No visible cervical adenopathy.  CHEST Normal respiratory effort. No intercostal retractions  LUNGS: Clear to auscultation bilaterally.  No stridor. No wheezes. No rales.  CARDIOVASCULAR: Regular rate and rhythm, positive S1-S2, no murmurs rubs or gallops appreciated. ABDOMINAL: Soft, nontender, nondistended, positive bowel sounds in all 4 quadrants, binder/brace in place.  No apparent ascites.  EXTREMITIES: No peripheral edema  HEMATOLOGIC: No significant bruising NEUROLOGIC: Oriented to person, place, and time. Nonfocal. Normal muscle tone.  PSYCHIATRIC: Normal mood and affect. Normal behavior. Cooperative  CARDIAC DATABASE: EKG: 03/04/2020: Sinus bradycardia, 58 bpm, normal axis, poor R wave progression, without underlying ischemia or injury pattern.  Echocardiogram: 03/05/2020: Normal LV systolic function with visual EF 60-65%. Left ventricle cavity is normal in size. Normal global wall motion. Normal diastolic filling pattern, normal LAP. Calculated EF 63%. Mild (Grade I) mitral regurgitation. Mild tricuspid regurgitation. Mild pulmonic  regurgitation. No prior study for comparison.  Stress Testing:  Lexiscan/modified Bruce Tetrofosmin stress test 04/13/2020: Normal myocardial perfusion. Stress LVEF calculated 44%, although visually appears normal. Low risk study.  Heart Catheterization: None  Carotid duplex: 03/29/2019: Stenosis in the left internal carotid artery (50-69%). No hemodynamically significant stenosis right ICA. Antegrade right vertebral artery flow. Antegrade left vertebral artery flow. Compared to the study done on 01/17/2017, previously bilateral ICA stenosis of less than 50% was noted. This represents a progression of extracranial cerebral vascular disease. Follow up in six months is appropriate if clinically indicated.  03/09/2020:  No hemodynamically significant stenosis right ICA.  Stenosis in the left internal carotid artery (50-69%).  Antegrade right vertebral artery flow. Antegrade left vertebral artery flow.  Compared to the study done on 10/02/2019,  no significant change noted. Follow up in six months is appropriate if clinically indicated.  LABORATORY DATA: CBC Latest Ref Rng & Units 04/10/2020 11/02/2018 04/30/2018  WBC 4.0 - 10.5 K/uL 7.5 7.9 5.5  Hemoglobin 12.0 - 15.0 g/dL 12.9 13.4 13.1  Hematocrit 36 - 46 % 39.8 42.7 42.2  Platelets 150 - 400 K/uL 251 278 257    CMP Latest Ref Rng & Units 04/10/2020 06/12/2019 11/02/2018  Glucose 70 - 99 mg/dL 99 103(H) 110(H)  BUN 8 - 23 mg/dL _0 Creatinine 0.44 - 1.00 mg/dL 0.89 0.80 0.84  Sodium 135 - 145 mmol/L 141 143 142  Potassium 3.5 - 5.1 mmol/L 3.9 5.1 4.2  Chloride 98 - 111 mmol/L 105 105 106  CO2 22 - 32 mmol/L _1 Calcium 8.9 - 10.3 mg/dL 9.0 9.0 9.3  Total Protein 6.5 - 8.1 g/dL - - -  Total Bilirubin 0.3 - 1.2 mg/dL - - -  Alkaline Phos 38 - 126 U/L - - -  AST 15 - 41 U/L - - -  ALT 0 - 44 U/L - - -    External Labs: 02/20/2019: CBC normal, total cholesterol 181, triglycerides 180, HDL 58, LDL 87, non-HDL cholesterol 123.  Serum glucose 101 mg, BUN 19, creatinine 0.73, EGFR 79 mL, potassium 5.1. CMP otherwise normal.  Collected: January 23, 2020 Creatinine 0.73 mg/dL. Lipid profile: Total cholesterol 170 triglycerides 154, HDL 63, LDL 80, TSH: 2.0  Cardiac Panel (last 3 results) No results for input(s): CKTOTAL, CKMB, TROPONINIHS, RELINDX in the last 72 hours.  IMPRESSION:    ICD-10-CM   1. Left carotid stenosis  I65.22   2. Essential hypertension  I10   3. Mixed hyperlipidemia  E78.2   4. Encounter to discuss test results  Z71.2      RECOMMENDATIONS: Ilse Billman is a 74 y.o. female whose past medical history and cardiovascular risk factors include: left carotid artery disease, hypertension, hyperlipidemia, postmenopausal female, advanced age.   Left carotid artery stenosis:  Currently asymptomatic.  Continue aspirin 81 mg p.o. daily.  Patient is currently on Crestor 10 mg p.o. nightly.  Patient's most recent lipid profile reviewed.  Total cholesterol and LDL are improving.  But her LDL is greater than 70 mg/dL.  Estimated 10-year risk of ASCVD is estimated to be approximately 16% and therefore recommended that she increase her Crestor to 20 mg p.o. nightly.  Patient states that this medication and cholesterol is currently being managed by primary care.  She is more than welcome to speak to Dr. Inda Merlin prior to uptitrating her statin therapy.  Most recent carotid duplex results reviewed with the patient.  The severity of left carotid artery stenosis remains relatively stable.  Recommend follow-up study in 6 months.  Benign essential hypertension:  Office blood pressure is at goal.   Medication reconciled.   She is asked to keep a log of both blood pressure and pulse so that medications can be titrated based on a trend as opposed to isolated blood pressure readings in the office.  If the blood pressure is consistently greater than 161mHg patient is asked to call the office for medication  titration sooner than the next office visit.   Low salt diet recommended. A diet that is rich in fruits, vegetables, legumes, and low-fat dairy products and low in snacks, sweets, and meats (such as the Dietary Approaches to Stop Hypertension [DASH] diet). d  Echocardiogram will be ordered to evaluate for structural heart  disease and left ventricular systolic function.  Mixed hyperlipidemia: Currently on Crestor.  Most recent lipid profile reviewed.  Does not endorse myalgias.   FINAL MEDICATION LIST END OF ENCOUNTER: No orders of the defined types were placed in this encounter.    Current Outpatient Medications:    acetaminophen (TYLENOL) 325 MG tablet, Take 650 mg by mouth every 6 (six) hours as needed for mild pain or headache., Disp: , Rfl:    amoxicillin (AMOXIL) 500 MG tablet, Take 2,000 mg by mouth See admin instructions. Before dental procedures , Disp: , Rfl:    aspirin EC 81 MG tablet, Take 81 mg by mouth at bedtime. , Disp: , Rfl:    Cholecalciferol (VITAMIN D3) 5000 units TABS, Take 5,000 Units by mouth at bedtime. , Disp: , Rfl:    clindamycin (CLEOCIN T) 1 % lotion, Apply 1 application topically daily. , Disp: , Rfl:    Cyanocobalamin (VITAMIN B-12) 5000 MCG TBDP, Take 5,000 mcg by mouth at bedtime. , Disp: , Rfl:    desonide (DESOWEN) 0.05 % cream, Apply 1 application topically daily as needed (itching)., Disp: , Rfl:    diclofenac Sodium (VOLTAREN) 1 % GEL, Apply 2 g topically daily as needed (pain)., Disp: , Rfl:    FLUoxetine (PROZAC) 20 MG capsule, Take 20 mg by mouth at bedtime. , Disp: , Rfl:    losartan (COZAAR) 50 MG tablet, Take 1 tablet (50 mg total) by mouth daily. (Patient taking differently: Take 50 mg by mouth at bedtime. ), Disp: 90 tablet, Rfl: 3   methocarbamol (ROBAXIN) 500 MG tablet, Take 1 tablet (500 mg total) by mouth 4 (four) times daily., Disp: 45 tablet, Rfl: 0   rosuvastatin (CRESTOR) 10 MG tablet, Take 10 mg by mouth at bedtime. , Disp: ,  Rfl:    methylPREDNISolone (MEDROL DOSEPAK) 4 MG TBPK tablet, Take by mouth as directed., Disp: , Rfl:    traMADol (ULTRAM) 50 MG tablet, Take 2 tablets (100 mg total) by mouth every 6 (six) hours as needed for moderate pain or severe pain. (Patient not taking: Reported on 04/27/2020), Disp: 30 tablet, Rfl: 0  No orders of the defined types were placed in this encounter.  --Continue cardiac medications as reconciled in final medication list. --Return in about 6 months (around 10/28/2020) for carotid disease. . Or sooner if needed. --Continue follow-up with your primary care physician regarding the management of your other chronic comorbid conditions.  Patient's questions and concerns were addressed to her satisfaction. She voices understanding of the instructions provided during this encounter.   This note was created using a voice recognition software as a result there may be grammatical errors inadvertently enclosed that do not reflect the nature of this encounter. Every attempt is made to correct such errors.  Rex Kras, [place], [facility]  Pager: [phone] Office: [phone]

## 2020-05-06 ENCOUNTER — Other Ambulatory Visit: Payer: Self-pay

## 2020-05-06 DIAGNOSIS — I6522 Occlusion and stenosis of left carotid artery: Secondary | ICD-10-CM

## 2020-05-06 DIAGNOSIS — Z9889 Other specified postprocedural states: Secondary | ICD-10-CM | POA: Diagnosis not present

## 2020-05-06 DIAGNOSIS — R21 Rash and other nonspecific skin eruption: Secondary | ICD-10-CM | POA: Diagnosis not present

## 2020-05-06 DIAGNOSIS — R300 Dysuria: Secondary | ICD-10-CM | POA: Diagnosis not present

## 2020-05-06 DIAGNOSIS — R232 Flushing: Secondary | ICD-10-CM | POA: Diagnosis not present

## 2020-06-09 DIAGNOSIS — M545 Low back pain: Secondary | ICD-10-CM | POA: Diagnosis not present

## 2020-06-09 DIAGNOSIS — M48062 Spinal stenosis, lumbar region with neurogenic claudication: Secondary | ICD-10-CM | POA: Diagnosis not present

## 2020-06-11 DIAGNOSIS — R69 Illness, unspecified: Secondary | ICD-10-CM | POA: Diagnosis not present

## 2020-06-19 DIAGNOSIS — Z808 Family history of malignant neoplasm of other organs or systems: Secondary | ICD-10-CM | POA: Diagnosis not present

## 2020-06-19 DIAGNOSIS — L408 Other psoriasis: Secondary | ICD-10-CM | POA: Diagnosis not present

## 2020-06-19 DIAGNOSIS — D225 Melanocytic nevi of trunk: Secondary | ICD-10-CM | POA: Diagnosis not present

## 2020-06-19 DIAGNOSIS — Z86018 Personal history of other benign neoplasm: Secondary | ICD-10-CM | POA: Diagnosis not present

## 2020-06-19 DIAGNOSIS — L304 Erythema intertrigo: Secondary | ICD-10-CM | POA: Diagnosis not present

## 2020-06-19 DIAGNOSIS — D2272 Melanocytic nevi of left lower limb, including hip: Secondary | ICD-10-CM | POA: Diagnosis not present

## 2020-06-19 DIAGNOSIS — Z85828 Personal history of other malignant neoplasm of skin: Secondary | ICD-10-CM | POA: Diagnosis not present

## 2020-06-19 DIAGNOSIS — L719 Rosacea, unspecified: Secondary | ICD-10-CM | POA: Diagnosis not present

## 2020-06-19 DIAGNOSIS — L821 Other seborrheic keratosis: Secondary | ICD-10-CM | POA: Diagnosis not present

## 2020-06-19 DIAGNOSIS — D2271 Melanocytic nevi of right lower limb, including hip: Secondary | ICD-10-CM | POA: Diagnosis not present

## 2020-08-08 ENCOUNTER — Other Ambulatory Visit: Payer: Self-pay | Admitting: Cardiology

## 2020-08-11 DIAGNOSIS — M5459 Other low back pain: Secondary | ICD-10-CM | POA: Diagnosis not present

## 2020-09-10 DIAGNOSIS — R69 Illness, unspecified: Secondary | ICD-10-CM | POA: Diagnosis not present

## 2020-09-11 DIAGNOSIS — H2513 Age-related nuclear cataract, bilateral: Secondary | ICD-10-CM | POA: Diagnosis not present

## 2020-09-11 DIAGNOSIS — H01001 Unspecified blepharitis right upper eyelid: Secondary | ICD-10-CM | POA: Diagnosis not present

## 2020-09-16 DIAGNOSIS — R69 Illness, unspecified: Secondary | ICD-10-CM | POA: Diagnosis not present

## 2020-09-18 ENCOUNTER — Other Ambulatory Visit: Payer: Self-pay

## 2020-09-18 ENCOUNTER — Ambulatory Visit: Payer: Medicare HMO

## 2020-09-18 DIAGNOSIS — R0989 Other specified symptoms and signs involving the circulatory and respiratory systems: Secondary | ICD-10-CM | POA: Diagnosis not present

## 2020-09-18 DIAGNOSIS — I6523 Occlusion and stenosis of bilateral carotid arteries: Secondary | ICD-10-CM | POA: Diagnosis not present

## 2020-09-18 DIAGNOSIS — Z0100 Encounter for examination of eyes and vision without abnormal findings: Secondary | ICD-10-CM | POA: Diagnosis not present

## 2020-09-20 ENCOUNTER — Other Ambulatory Visit: Payer: Self-pay | Admitting: Cardiology

## 2020-09-20 DIAGNOSIS — I6522 Occlusion and stenosis of left carotid artery: Secondary | ICD-10-CM

## 2020-09-21 ENCOUNTER — Other Ambulatory Visit: Payer: Medicare HMO

## 2020-10-08 ENCOUNTER — Telehealth (HOSPITAL_COMMUNITY): Payer: Self-pay | Admitting: Family

## 2020-10-08 ENCOUNTER — Encounter (HOSPITAL_COMMUNITY): Payer: Self-pay | Admitting: Family

## 2020-10-08 ENCOUNTER — Other Ambulatory Visit (HOSPITAL_COMMUNITY): Payer: Self-pay | Admitting: Family

## 2020-10-08 DIAGNOSIS — U071 COVID-19: Secondary | ICD-10-CM

## 2020-10-08 NOTE — Progress Notes (Addendum)
I connected by phone with Kimberly Lowery on 10/08/2020 at 6:54 PM to discuss the potential use of the antiviral REMDESIVIR for acute COVID-19 viral infection in non-hospitalized patients.   This patient is a 75 y.o. female that meets the FDA criteria for Emergency Use Authorization of REMDESIVIR.  Has a (+) direct SARS-CoV-2 viral test result  Has mild or moderate symptoms related to COVID-19  Is within 7 days of symptom onset  Has at least one of the high risk factor(s) for progression to severe COVID-19 and/or hospitalization as defined in NIH Guidelines and EUA.   Specific high risk criteria : Older age (>/= 73 yo) and Cardiovascular disease or hypertension   Symptoms of H/A, sorethroat, orthopnea began 10/06/2020.  She is fully vaccinated.    I have spoken and communicated the following to the patient or parent/caregiver regarding COVID-19 IV Antiviral treatment:  1. FDA has authorized the emergency use for the treatment of mild to moderate COVID-19 in adults and pediatric patients with positive results of SARS-CoV-2 testing who are 73 years of age and older weighing at least 40 kg, and who are at high risk for progressing to severe COVID-19 and/or hospitalization.  2. The significant known and potential risks and benefits in receiving REMDESIVIR in accordance with current NIH treatment guidelines.   3. Information on available alternative treatments and the risks and benefits of those alternatives, including clinical trials that may be accessible to the patient.   4. Patients treated with antiviral therapy should continue to isolate and use infection control measures (e.g., wear mask, isolate, social distance, avoid sharing personal items, clean and disinfect "high touch" surfaces, and frequent handwashing) according to CDC guidelines.   5. The patient or parent/caregiver has the option to accept or refuse REMDESIVIR therapy and has had the opportunity to have all questions addressed  prior to consenting for treatment.    After reviewing this information with the patient, he/she has decided to proceed with the 3 day course of treatment.   [name], NP 10/08/2020  6:54 PM

## 2020-10-08 NOTE — Telephone Encounter (Signed)
Erroneous

## 2020-10-09 ENCOUNTER — Ambulatory Visit (HOSPITAL_COMMUNITY)
Admission: RE | Admit: 2020-10-09 | Discharge: 2020-10-09 | Disposition: A | Discharge status: Home / Self Care | Payer: Medicare HMO | Source: Ambulatory Visit | Attending: Pulmonary Disease | Admitting: Pulmonary Disease

## 2020-10-09 DIAGNOSIS — R54 Age-related physical debility: Secondary | ICD-10-CM | POA: Diagnosis not present

## 2020-10-09 DIAGNOSIS — I10 Essential (primary) hypertension: Secondary | ICD-10-CM | POA: Insufficient documentation

## 2020-10-09 DIAGNOSIS — U071 COVID-19: Secondary | ICD-10-CM | POA: Insufficient documentation

## 2020-10-09 MED ORDER — ALBUTEROL SULFATE HFA 108 (90 BASE) MCG/ACT IN AERS: 2.0000 | INHALATION_SPRAY | Freq: Once | RESPIRATORY_TRACT | Status: DC | PRN

## 2020-10-09 MED ORDER — FAMOTIDINE IN NACL 20-0.9 MG/50ML-% IV SOLN: 20.0000 mg | Freq: Once | INTRAVENOUS | Status: DC | PRN

## 2020-10-09 MED ORDER — EPINEPHRINE 0.3 MG/0.3ML IJ SOAJ: 0.3000 mg | Freq: Once | INTRAMUSCULAR | Status: DC | PRN

## 2020-10-09 MED ORDER — DIPHENHYDRAMINE HCL 50 MG/ML IJ SOLN: 50.0000 mg | Freq: Once | INTRAMUSCULAR | Status: DC | PRN

## 2020-10-09 MED ORDER — METHYLPREDNISOLONE SODIUM SUCC 125 MG IJ SOLR: 125.0000 mg | Freq: Once | INTRAMUSCULAR | Status: DC | PRN

## 2020-10-09 MED ORDER — SODIUM CHLORIDE 0.9 % IV SOLN
100.0000 mg | Freq: Once | INTRAVENOUS | Status: AC
  Administered 2020-10-09: 100 mg via INTRAVENOUS

## 2020-10-09 MED ORDER — SODIUM CHLORIDE 0.9 % IV SOLN: INTRAVENOUS | Status: DC | PRN

## 2020-10-09 NOTE — Discharge Instructions (Signed)
10 Things You Can Do to Manage Your COVID-19 Symptoms at Home If you have possible or confirmed COVID-19: 1. Stay home from work and school. And stay away from other public places. If you must go out, avoid using any kind of public transportation, ridesharing, or taxis. 2. Monitor your symptoms carefully. If your symptoms get worse, call your healthcare provider immediately. 3. Get rest and stay hydrated. 4. If you have a medical appointment, call the healthcare provider ahead of time and tell them that you have or may have COVID-19. 5. For medical emergencies, call 911 and notify the dispatch personnel that you have or may have COVID-19. 6. Cover your cough and sneezes with a tissue or use the inside of your elbow. 7. Wash your hands often with soap and water for at least 20 seconds or clean your hands with an alcohol-based hand sanitizer that contains at least 60% alcohol. 8. As much as possible, stay in a specific room and away from other people in your home. Also, you should use a separate bathroom, if available. If you need to be around other people in or outside of the home, wear a mask. 9. Avoid sharing personal items with other people in your household, like dishes, towels, and bedding. 10. Clean all surfaces that are touched often, like counters, tabletops, and doorknobs. Use household cleaning sprays or wipes according to the label instructions. cdc.gov/coronavirus 04/03/2019 This information is not intended to replace advice given to you by your health care provider. Make sure you discuss any questions you have with your health care provider. Document Revised: 09/05/2019 Document Reviewed: 09/05/2019 Elsevier Patient Education  2020 [employer]vier Inc.  If you have any questions or concerns after the infusion please call the Advanced Practice Provider on call at [phone]. This number is ONLY intended for your use regarding questions or concerns about the infusion post-treatment  side-effects.  Please do not provide this number to others for use. For return to work notes please contact your primary care provider.   If someone you know is interested in receiving treatment please have them call the COVID hotline at [phone].    

## 2020-10-09 NOTE — Progress Notes (Signed)
  Diagnosis: COVID-19  Physician: Dr. Wright  Procedure: [facility] Med: remdesivir infusion - Provided patient with remdesivir fact sheet for patients, parents and caregivers prior to infusion.  Complications: No immediate complications noted.  Discharge: Discharged home   Smith, Fowler Richards 10/09/2020  \ 

## 2020-10-09 NOTE — Progress Notes (Signed)
Patient reviewed Fact Sheet for Patients, Parents, and Caregivers for Emergency Use Authorization (EUA) of remdesivir for the Treatment of Coronavirus. Patient also reviewed and is agreeable to the estimated cost of treatment. Patient is agreeable to proceed.    

## 2020-10-10 ENCOUNTER — Ambulatory Visit (HOSPITAL_COMMUNITY)
Admission: RE | Admit: 2020-10-10 | Discharge: 2020-10-10 | Disposition: A | Discharge status: Home / Self Care | Payer: Medicare HMO | Source: Ambulatory Visit | Attending: Pulmonary Disease | Admitting: Pulmonary Disease

## 2020-10-10 DIAGNOSIS — I10 Essential (primary) hypertension: Secondary | ICD-10-CM | POA: Diagnosis not present

## 2020-10-10 DIAGNOSIS — R54 Age-related physical debility: Secondary | ICD-10-CM | POA: Diagnosis not present

## 2020-10-10 DIAGNOSIS — U071 COVID-19: Secondary | ICD-10-CM | POA: Diagnosis not present

## 2020-10-10 MED ORDER — SODIUM CHLORIDE 0.9 % IV SOLN: INTRAVENOUS | Status: DC | PRN

## 2020-10-10 MED ORDER — SODIUM CHLORIDE 0.9 % IV SOLN
100.0000 mg | Freq: Once | INTRAVENOUS | Status: AC
  Administered 2020-10-10: 100 mg via INTRAVENOUS

## 2020-10-10 MED ORDER — ALBUTEROL SULFATE HFA 108 (90 BASE) MCG/ACT IN AERS: 2.0000 | INHALATION_SPRAY | Freq: Once | RESPIRATORY_TRACT | Status: DC | PRN

## 2020-10-10 MED ORDER — EPINEPHRINE 0.3 MG/0.3ML IJ SOAJ: 0.3000 mg | Freq: Once | INTRAMUSCULAR | Status: DC | PRN

## 2020-10-10 MED ORDER — DIPHENHYDRAMINE HCL 50 MG/ML IJ SOLN: 50.0000 mg | Freq: Once | INTRAMUSCULAR | Status: DC | PRN

## 2020-10-10 MED ORDER — FAMOTIDINE IN NACL 20-0.9 MG/50ML-% IV SOLN: 20.0000 mg | Freq: Once | INTRAVENOUS | Status: DC | PRN

## 2020-10-10 MED ORDER — METHYLPREDNISOLONE SODIUM SUCC 125 MG IJ SOLR: 125.0000 mg | Freq: Once | INTRAMUSCULAR | Status: DC | PRN

## 2020-10-10 NOTE — Progress Notes (Signed)
  Diagnosis: COVID-19  Physician: Dr. Asencion Noble  Procedure: [facility] Med: remdesivir infusion - Provided patient with remdesivir fact sheet for patients, parents and caregivers prior to infusion.  Complications: No immediate complications noted.  Discharge: Discharged home   Kimberly Lowery 10/10/2020

## 2020-10-10 NOTE — Progress Notes (Signed)
Patient reviewed Fact Sheet for Patients, Parents, and Caregivers for Emergency Use Authorization (EUA) of remdesivir for the Treatment of Coronavirus. Patient also reviewed and is agreeable to the estimated cost of treatment. Patient is agreeable to proceed.    

## 2020-10-10 NOTE — Discharge Instructions (Signed)
10 Things You Can Do to Manage Your COVID-19 Symptoms at Home If you have possible or confirmed COVID-19: 1. Stay home from work and school. And stay away from other public places. If you must go out, avoid using any kind of public transportation, ridesharing, or taxis. 2. Monitor your symptoms carefully. If your symptoms get worse, call your healthcare provider immediately. 3. Get rest and stay hydrated. 4. If you have a medical appointment, call the healthcare provider ahead of time and tell them that you have or may have COVID-19. 5. For medical emergencies, call 911 and notify the dispatch personnel that you have or may have COVID-19. 6. Cover your cough and sneezes with a tissue or use the inside of your elbow. 7. Wash your hands often with soap and water for at least 20 seconds or clean your hands with an alcohol-based hand sanitizer that contains at least 60% alcohol. 8. As much as possible, stay in a specific room and away from other people in your home. Also, you should use a separate bathroom, if available. If you need to be around other people in or outside of the home, wear a mask. 9. Avoid sharing personal items with other people in your household, like dishes, towels, and bedding. 10. Clean all surfaces that are touched often, like counters, tabletops, and doorknobs. Use household cleaning sprays or wipes according to the label instructions. cdc.gov/coronavirus 04/03/2019 This information is not intended to replace advice given to you by your health care provider. Make sure you discuss any questions you have with your health care provider. Document Revised: 09/05/2019 Document Reviewed: 09/05/2019 Elsevier Patient Education  2020 [employer]vier Inc.  If you have any questions or concerns after the infusion please call the Advanced Practice Provider on call at [phone]. This number is ONLY intended for your use regarding questions or concerns about the infusion post-treatment  side-effects.  Please do not provide this number to others for use. For return to work notes please contact your primary care provider.   If someone you know is interested in receiving treatment please have them call the COVID hotline at [phone].    

## 2020-10-12 ENCOUNTER — Ambulatory Visit (HOSPITAL_COMMUNITY)
Admission: RE | Admit: 2020-10-12 | Discharge: 2020-10-12 | Disposition: A | Discharge status: Home / Self Care | Payer: Medicare HMO | Source: Ambulatory Visit | Attending: Pulmonary Disease | Admitting: Pulmonary Disease

## 2020-10-12 DIAGNOSIS — I10 Essential (primary) hypertension: Secondary | ICD-10-CM | POA: Diagnosis not present

## 2020-10-12 DIAGNOSIS — R54 Age-related physical debility: Secondary | ICD-10-CM | POA: Diagnosis not present

## 2020-10-12 DIAGNOSIS — U071 COVID-19: Secondary | ICD-10-CM | POA: Diagnosis not present

## 2020-10-12 MED ORDER — ALBUTEROL SULFATE HFA 108 (90 BASE) MCG/ACT IN AERS: 2.0000 | INHALATION_SPRAY | Freq: Once | RESPIRATORY_TRACT | Status: DC | PRN

## 2020-10-12 MED ORDER — SODIUM CHLORIDE 0.9 % IV SOLN: INTRAVENOUS | Status: DC | PRN

## 2020-10-12 MED ORDER — SODIUM CHLORIDE 0.9 % IV SOLN
100.0000 mg | Freq: Once | INTRAVENOUS | Status: AC
  Administered 2020-10-12: 100 mg via INTRAVENOUS

## 2020-10-12 MED ORDER — METHYLPREDNISOLONE SODIUM SUCC 125 MG IJ SOLR: 125.0000 mg | Freq: Once | INTRAMUSCULAR | Status: DC | PRN

## 2020-10-12 MED ORDER — FAMOTIDINE IN NACL 20-0.9 MG/50ML-% IV SOLN: 20.0000 mg | Freq: Once | INTRAVENOUS | Status: DC | PRN

## 2020-10-12 MED ORDER — EPINEPHRINE 0.3 MG/0.3ML IJ SOAJ: 0.3000 mg | Freq: Once | INTRAMUSCULAR | Status: DC | PRN

## 2020-10-12 MED ORDER — DIPHENHYDRAMINE HCL 50 MG/ML IJ SOLN: 50.0000 mg | Freq: Once | INTRAMUSCULAR | Status: DC | PRN

## 2020-10-12 NOTE — Discharge Instructions (Signed)
10 Things You Can Do to Manage Your COVID-19 Symptoms at Home °If you have possible or confirmed COVID-19: °1. Stay home except to get medical care. °2. Monitor your symptoms carefully. If your symptoms get worse, call your healthcare provider immediately. °3. Get rest and stay hydrated. °4. If you have a medical appointment, call the healthcare provider ahead of time and tell them that you have or may have COVID-19. °5. For medical emergencies, call 911 and notify the dispatch personnel that you have or may have COVID-19. °6. Cover your cough and sneezes with a tissue or use the inside of your elbow. °7. Wash your hands often with soap and water for at least 20 seconds or clean your hands with an alcohol-based hand sanitizer that contains at least 60% alcohol. °8. As much as possible, stay in a specific room and away from other people in your home. Also, you should use a separate bathroom, if available. If you need to be around other people in or outside of the home, wear a mask. °9. Avoid sharing personal items with other people in your household, like dishes, towels, and bedding. °10. Clean all surfaces that are touched often, like counters, tabletops, and doorknobs. Use household cleaning sprays or wipes according to the label instructions. °cdc.gov/coronavirus °04/17/2020 °This information is not intended to replace advice given to you by your health care provider. Make sure you discuss any questions you have with your health care provider. °Document Revised: 08/03/2020 Document Reviewed: 08/03/2020 °Elsevier Patient Education © 2021 Elsevier Inc. ° ° ° °If you have any questions or concerns after the infusion please call the Advanced Practice Provider on call at [phone]. This number is ONLY intended for your use regarding questions or concerns about the infusion post-treatment side-effects.  Please do not provide this number to others for use. For return to work notes please contact your primary care  provider.  ° °If someone you know is interested in receiving treatment please have them call the COVID hotline at [phone]. ° ° ° °

## 2020-10-28 ENCOUNTER — Ambulatory Visit: Payer: Medicare HMO | Admitting: Cardiology

## 2020-10-28 ENCOUNTER — Other Ambulatory Visit: Payer: Self-pay

## 2020-10-28 ENCOUNTER — Encounter: Payer: Self-pay | Admitting: Cardiology

## 2020-10-28 VITALS — BP 121/71 | HR 71 | Temp 97.8°F | Resp 16 | Ht 67.0 in | Wt 174.0 lb

## 2020-10-28 DIAGNOSIS — E782 Mixed hyperlipidemia: Secondary | ICD-10-CM

## 2020-10-28 DIAGNOSIS — I6522 Occlusion and stenosis of left carotid artery: Secondary | ICD-10-CM

## 2020-10-28 DIAGNOSIS — I10 Essential (primary) hypertension: Secondary | ICD-10-CM

## 2020-10-28 DIAGNOSIS — Z8616 Personal history of COVID-19: Secondary | ICD-10-CM | POA: Diagnosis not present

## 2020-10-28 NOTE — Progress Notes (Signed)
Kimberly Lowery Date of Birth: Oct 15, 1945 MRN: [id number] Primary Care Provider:Gates, Kimberly [place], MD Former Cardiology Providers: Jeri Lager, APRN, FNP-C  Primary Cardiologist: Rex Kras, DO, [facility] (established care 03/04/2020)  Date: 10/28/20 Last Office Visit: 04/27/2020  Chief Complaint  Patient presents with  . Left carotid stenosis  . Follow-up    HPI  Kimberly Lowery is a 75 y.o.  female who presents to the office with a chief complaint of " 6 month follow up for carotid artery disease." Patient's past medical history and cardiovascular risk factors include: left carotid artery disease, Hx of COVID 19 (Jan 2022), hypertension, hyperlipidemia, postmenopausal female, advanced age.   Patient is accompanied by her husband Kimberly Lowery at today's office visit.  Patient has been following in the office originally with Jeri Lager, APRN for the management of hypertension and carotid artery disease and reestablished care with myself back in June 2021.    Given her left carotid bruit she underwent carotid duplex which noted 50-69% stenosis within the LICA. She had a 6 month follow-up study in December 2021 and was noted to have no significant stenosis. She is here for follow up.   From cardiac standpoint she is doing well. No hospitalizations or urgent care visits for cardiovascular symptoms.  In January 2022 patient did have COVID-19 pneumonia but did not require hospitalization.  She had received both her vaccinations as well as the booster.    Patient's blood pressure is very well controlled on current medical therapy.    ALLERGIES: Allergies  Allergen Reactions  . Shellfish Allergy Hives and Nausea And Vomiting    SHRIMP LOBSTER  . Tape Other (See Comments)    causes blisters  . Codeine Nausea And Vomiting  . Dilaudid [Hydromorphone Hcl] Nausea And Vomiting  . Florastor Kids [Saccharomyces Boulardii] Other (See Comments)    "SIGNIFICANT CONSTIPATION"  . Garlic Other (See  Comments)    Stomach Upset  . Morphine Sulfate Nausea And Vomiting  . Nucynta [Tapentadol] Other (See Comments)    hallucinations   . Percocet [Oxycodone-Acetaminophen] Nausea And Vomiting     MEDICATION LIST PRIOR TO VISIT: Current Outpatient Medications on File Prior to Visit  Medication Sig Dispense Refill  . acetaminophen (TYLENOL) 325 MG tablet Take 650 mg by mouth every 6 (six) hours as needed for mild pain or headache.    Marland Kitchen amoxicillin (AMOXIL) 500 MG tablet Take 2,000 mg by mouth See admin instructions. Before dental procedures     . aspirin EC 81 MG tablet Take 81 mg by mouth at bedtime.     . Cholecalciferol (VITAMIN D3) 5000 units TABS Take 5,000 Units by mouth at bedtime.     . Cyanocobalamin (VITAMIN B-12) 5000 MCG TBDP Take 5,000 mcg by mouth at bedtime.     Marland Kitchen FLUoxetine (PROZAC) 20 MG capsule Take 20 mg by mouth at bedtime.     Marland Kitchen losartan (COZAAR) 50 MG tablet Take 50 mg by mouth every evening.    . rosuvastatin (CRESTOR) 10 MG tablet Take 10 mg by mouth at bedtime.      No current facility-administered medications on file prior to visit.    PAST MEDICAL HISTORY: Past Medical History:  Diagnosis Date  . Anxiety   . Arthritis    "some in my back; was in my hips" (03/22/2016)  . Asthma   . Chronic lower back pain   . Depression   . Difficult intubation   . Family history of anesthesia complication    "my  aunt didn't go out during OR"  . Fibrocystic breast disease   . H/O hiatal hernia   . High cholesterol   . HLD (hyperlipidemia) 05/08/2019  . Hypertension   . IBS (irritable bowel syndrome)   . Obesity   . PONV (postoperative nausea and vomiting)    bad after last hip replacement  . Sleep apnea    "never RX mask" (03/22/2016)    PAST SURGICAL HISTORY: Past Surgical History:  Procedure Laterality Date  . BACK SURGERY    . BILATERAL TEMPOROMANDIBULAR JOINT ARTHROPLASTY Bilateral 2002  . BREAST CYST ASPIRATION Left 1980's  . COLONOSCOPY W/ POLYPECTOMY  ~  2007  . DEBRIDEMENT TENNIS ELBOW Right 1990  . DILATION AND CURETTAGE OF UTERUS  1980's X 2  . EYE SURGERY Bilateral 1974   "Pinguecula"  . FINGER SURGERY Right    index cyst removed  . JOINT REPLACEMENT    . KNEE ARTHROSCOPY WITH SUBCHONDROPLASTY Left 05/08/2018   Procedure: KNEE ARTHROSCOPY WITH SUBCHONDROPLASTY;  Surgeon: Melrose Nakayama, MD;  Location: [place] [facility];  Service: Orthopedics;  Laterality: Left;  . LAPAROSCOPIC CHOLECYSTECTOMY  1999  . [place] SURGERY  01/2009  . LUMBAR FUSION  08/2009  . LUMBAR LAMINECTOMY  1981  . MASS EXCISION Left 04/10/2017   Procedure: EXCISION OF LEFT INDEX FINGER CYST;  Surgeon: Milly Jakob, MD;  Location: [place];  Service: Orthopedics;  Laterality: Left;  . TONSILLECTOMY  1983  . TOTAL ABDOMINAL HYSTERECTOMY  1996  . TOTAL HIP ARTHROPLASTY Right 03/18/2014   Procedure: RIGHT TOTAL HIP ARTHROPLASTY ANTERIOR APPROACH;  Surgeon: Hessie Dibble, MD;  Location: [place];  Service: Orthopedics;  Laterality: Right;  . TOTAL HIP ARTHROPLASTY Left 03/22/2016  . TOTAL HIP ARTHROPLASTY Left 03/22/2016   Procedure: TOTAL HIP ARTHROPLASTY ANTERIOR APPROACH;  Surgeon: Melrose Nakayama, MD;  Location: [place];  Service: Orthopedics;  Laterality: Left;  . TOTAL KNEE ARTHROPLASTY Left 11/13/2018   Procedure: TOTAL KNEE ARTHROPLASTY;  Surgeon: Melrose Nakayama, MD;  Location: [place];  Service: Orthopedics;  Laterality: Left;  . TUBAL LIGATION  1981    FAMILY HISTORY: The patient's family history includes Colon cancer in her father; Heart disease in her mother.   SOCIAL HISTORY:  The patient  reports that she has never smoked. She has never used smokeless tobacco. She reports current alcohol use. She reports that she does not use drugs.  Review of Systems  Constitutional: Negative for chills and fever.  HENT: Negative for hoarse voice and nosebleeds.   Eyes: Negative for discharge, double vision and pain.  Cardiovascular: Negative for chest pain,  claudication, dyspnea on exertion, leg swelling, near-syncope, orthopnea, palpitations, paroxysmal nocturnal dyspnea and syncope.  Respiratory: Negative for hemoptysis and shortness of breath.   Musculoskeletal: Negative for muscle cramps and myalgias.  Gastrointestinal: Negative for abdominal pain, constipation, diarrhea, hematemesis, hematochezia, melena, nausea and vomiting.  Neurological: Negative for dizziness and light-headedness.   PHYSICAL EXAM: Vitals with BMI 10/28/2020 10/12/2020 10/12/2020  Height 5' 7"  - -  Weight 174 lbs - -  BMI 16.10 - -  Systolic 960 454 098  Diastolic 71 86 84  Pulse 71 72 71   CONSTITUTIONAL: Well-developed and well-nourished. No acute distress.  SKIN: Skin is warm and dry. No rash noted. No cyanosis. No pallor. No jaundice HEAD: Normocephalic and atraumatic.  EYES: No scleral icterus MOUTH/THROAT: Moist oral membranes.  NECK: No JVD present. No thyromegaly noted. Left carotid bruit.  LYMPHATIC: No visible cervical adenopathy.  CHEST Normal  respiratory effort. No intercostal retractions  LUNGS: Clear to auscultation bilaterally.  No stridor. No wheezes. No rales.  CARDIOVASCULAR: Regular rate and rhythm, positive S1-S2, no murmurs rubs or gallops appreciated. ABDOMINAL: Soft, nontender, nondistended, positive bowel sounds in all 4 quadrants, binder/brace in place.  No apparent ascites.  EXTREMITIES: No peripheral edema  HEMATOLOGIC: No significant bruising NEUROLOGIC: Oriented to person, place, and time. Nonfocal. Normal muscle tone.  PSYCHIATRIC: Normal mood and affect. Normal behavior. Cooperative  CARDIAC DATABASE: EKG: 10/28/2020: Sinus  Rhythm, 66bpm, normal axis, without underlying injury patttern.   Echocardiogram: 03/05/2020: Normal LV systolic function with visual EF 60-65%. Left ventricle cavity is normal in size. Normal global wall motion. Normal diastolic filling pattern, normal LAP. Calculated EF 63%. Mild (Grade I) mitral  regurgitation. Mild tricuspid regurgitation. Mild pulmonic regurgitation. No prior study for comparison.  Stress Testing:  Lexiscan/modified Bruce Tetrofosmin stress test 04/13/2020: Normal myocardial perfusion. Stress LVEF calculated 44%, although visually appears normal. Low risk study.  Heart Catheterization: None  Carotid duplex: 03/09/2020:  No hemodynamically significant stenosis right ICA.  Stenosis in the left internal carotid artery (50-69%).  Antegrade right vertebral artery flow. Antegrade left vertebral artery flow.  Compared to the study done on 10/02/2019, no significant change noted. Follow up in six months is appropriate if clinically indicated.  08/56/9437:  Peak systolic velocities in the right bifurcation, internal, external and  common carotid arteries are within normal limits.  Doppler velocity suggests stenosis in the left internal carotid artery (minimal).  Antegrade right vertebral artery flow. Antegrade left vertebral artery flow.  No significant plaque burden noted.  Compared to prior studies including 03/09/2020, left ICA stenosis of  50-69% not evident. Recheck in 1 year to confirm this new finding.  LABORATORY DATA: CBC Latest Ref Rng & Units 04/10/2020 11/02/2018 04/30/2018  WBC 4.0 - 10.5 K/uL 7.5 7.9 5.5  Hemoglobin 12.0 - 15.0 g/dL 12.9 13.4 13.1  Hematocrit 36.0 - 46.0 % 39.8 42.7 42.2  Platelets 150 - 400 K/uL 251 278 257    CMP Latest Ref Rng & Units 04/10/2020 06/12/2019 11/02/2018  Glucose 70 - 99 mg/dL 99 103(H) 110(H)  BUN 8 - 23 mg/dL 15 16 12   Creatinine 0.44 - 1.00 mg/dL 0.89 0.80 0.84  Sodium 135 - 145 mmol/L 141 143 142  Potassium 3.5 - 5.1 mmol/L 3.9 5.1 4.2  Chloride 98 - 111 mmol/L 105 105 106  CO2 22 - 32 mmol/L 27 26 27   Calcium 8.9 - 10.3 mg/dL 9.0 9.0 9.3  Total Protein 6.5 - 8.1 g/dL - - -  Total Bilirubin 0.3 - 1.2 mg/dL - - -  Alkaline Phos 38 - 126 U/L - - -  AST 15 - 41 U/L - - -  ALT 0 - 44 U/L - - -    External  Labs: 02/20/2019: CBC normal, total cholesterol 181, triglycerides 180, HDL 58, LDL 87, non-HDL cholesterol 123. Serum glucose 101 mg, BUN 19, creatinine 0.73, EGFR 79 mL, potassium 5.1. CMP otherwise normal.  Collected: January 23, 2020 Creatinine 0.73 mg/dL. Lipid profile: Total cholesterol 170 triglycerides 154, HDL 63, LDL 80, TSH: 2.0  Cardiac Panel (last 3 results) No results for input(s): CKTOTAL, CKMB, TROPONINIHS, RELINDX in the last 72 hours.  IMPRESSION:    ICD-10-CM   1. Left carotid stenosis  I65.22 EKG 12-Lead  2. Mixed hyperlipidemia  E78.2   3. Essential hypertension  I10   4. History of COVID-19  Z86.16      RECOMMENDATIONS: Kimberly  Zilphia Lowery is a 75 y.o. female whose past medical history and cardiovascular risk factors include: left carotid artery disease, hypertension, hyperlipidemia, postmenopausal female, advanced age.   Left carotid artery stenosis:  Currently asymptomatic.  Continue aspirin and statin.   Most recent carotid duplex studies it does not note any significant bilateral carotid artery stenosis.  I reviewed the reports and PSV less than 125 cm/s and end-diastolic velocities less than 40cm/sec.   On physical examination the left carotid bruit still present.  I would like to repeat the study in 1 year to reevaluate the degree of carotid artery stenosis.  These findings were discussed with the patient in great detail at today's office visit and she agrees with the plan of care.    Last lipid profile reviewed in April 2021.  Patient states that she is due for a physical in April 2022 and will have the labs sent over to [place] for reference.    I will see her back in one year or sooner if needed.   She is educated on being cognizant for symptoms of left-sided vision changes or right-sided focal neurological deficits given her history of left carotid artery stenosis.  If such symptoms present she is requested to go to ER via EMS.   Benign essential  hypertension: Well-controlled.  Continue current medical therapy.  Mixed hyperlipidemia: Currently on Crestor.  Most recent lipid profile reviewed.  Does not endorse myalgias.  FINAL MEDICATION LIST END OF ENCOUNTER: No orders of the defined types were placed in this encounter.    Current Outpatient Medications:  .  acetaminophen (TYLENOL) 325 MG tablet, Take 650 mg by mouth every 6 (six) hours as needed for mild pain or headache., Disp: , Rfl:  .  amoxicillin (AMOXIL) 500 MG tablet, Take 2,000 mg by mouth See admin instructions. Before dental procedures , Disp: , Rfl:  .  aspirin EC 81 MG tablet, Take 81 mg by mouth at bedtime. , Disp: , Rfl:  .  Cholecalciferol (VITAMIN D3) 5000 units TABS, Take 5,000 Units by mouth at bedtime. , Disp: , Rfl:  .  Cyanocobalamin (VITAMIN B-12) 5000 MCG TBDP, Take 5,000 mcg by mouth at bedtime. , Disp: , Rfl:  .  FLUoxetine (PROZAC) 20 MG capsule, Take 20 mg by mouth at bedtime. , Disp: , Rfl:  .  losartan (COZAAR) 50 MG tablet, Take 50 mg by mouth every evening., Disp: , Rfl:  .  rosuvastatin (CRESTOR) 10 MG tablet, Take 10 mg by mouth at bedtime. , Disp: , Rfl:   Orders Placed This Encounter  Procedures  . EKG 12-Lead   --Continue cardiac medications as reconciled in final medication list. --Return in about 11 months (around 09/27/2021) for Follow up after carotid duplex . Or sooner if needed. --Continue follow-up with your primary care physician regarding the management of your other chronic comorbid conditions.  Patient's questions and concerns were addressed to her satisfaction. She voices understanding of the instructions provided during this encounter.   This note was created using a voice recognition software as a result there may be grammatical errors inadvertently enclosed that do not reflect the nature of this encounter. Every attempt is made to correct such errors.  Total time spent: 30 minutes.   Rex Kras, [place], [facility]  Pager:  [phone] Office: [phone]

## 2020-10-31 NOTE — Telephone Encounter (Signed)
error 

## 2020-12-01 DIAGNOSIS — M5459 Other low back pain: Secondary | ICD-10-CM | POA: Diagnosis not present

## 2020-12-09 DIAGNOSIS — M545 Low back pain, unspecified: Secondary | ICD-10-CM | POA: Diagnosis not present

## 2020-12-14 DIAGNOSIS — M545 Low back pain, unspecified: Secondary | ICD-10-CM | POA: Diagnosis not present

## 2020-12-16 DIAGNOSIS — M545 Low back pain, unspecified: Secondary | ICD-10-CM | POA: Diagnosis not present

## 2020-12-24 DIAGNOSIS — M545 Low back pain, unspecified: Secondary | ICD-10-CM | POA: Diagnosis not present

## 2020-12-28 DIAGNOSIS — M545 Low back pain, unspecified: Secondary | ICD-10-CM | POA: Diagnosis not present

## 2021-02-02 DIAGNOSIS — Z79899 Other long term (current) drug therapy: Secondary | ICD-10-CM | POA: Diagnosis not present

## 2021-02-02 DIAGNOSIS — K219 Gastro-esophageal reflux disease without esophagitis: Secondary | ICD-10-CM | POA: Diagnosis not present

## 2021-02-02 DIAGNOSIS — M1990 Unspecified osteoarthritis, unspecified site: Secondary | ICD-10-CM | POA: Diagnosis not present

## 2021-02-02 DIAGNOSIS — H6120 Impacted cerumen, unspecified ear: Secondary | ICD-10-CM | POA: Diagnosis not present

## 2021-02-02 DIAGNOSIS — I6522 Occlusion and stenosis of left carotid artery: Secondary | ICD-10-CM | POA: Diagnosis not present

## 2021-02-02 DIAGNOSIS — Z86010 Personal history of colonic polyps: Secondary | ICD-10-CM | POA: Diagnosis not present

## 2021-02-02 DIAGNOSIS — R69 Illness, unspecified: Secondary | ICD-10-CM | POA: Diagnosis not present

## 2021-02-02 DIAGNOSIS — E559 Vitamin D deficiency, unspecified: Secondary | ICD-10-CM | POA: Diagnosis not present

## 2021-02-02 DIAGNOSIS — Z0000 Encounter for general adult medical examination without abnormal findings: Secondary | ICD-10-CM | POA: Diagnosis not present

## 2021-02-02 DIAGNOSIS — E538 Deficiency of other specified B group vitamins: Secondary | ICD-10-CM | POA: Diagnosis not present

## 2021-02-02 DIAGNOSIS — Z13820 Encounter for screening for osteoporosis: Secondary | ICD-10-CM | POA: Diagnosis not present

## 2021-02-02 DIAGNOSIS — E785 Hyperlipidemia, unspecified: Secondary | ICD-10-CM | POA: Diagnosis not present

## 2021-02-02 DIAGNOSIS — M545 Low back pain, unspecified: Secondary | ICD-10-CM | POA: Diagnosis not present

## 2021-03-10 ENCOUNTER — Other Ambulatory Visit: Payer: Self-pay | Admitting: Internal Medicine

## 2021-03-10 DIAGNOSIS — Z1231 Encounter for screening mammogram for malignant neoplasm of breast: Secondary | ICD-10-CM

## 2021-03-13 ENCOUNTER — Ambulatory Visit
Admission: RE | Admit: 2021-03-13 | Discharge: 2021-03-13 | Disposition: A | Discharge status: Home / Self Care | Payer: Medicare HMO | Source: Ambulatory Visit | Attending: Internal Medicine | Admitting: Internal Medicine

## 2021-03-13 DIAGNOSIS — Z1231 Encounter for screening mammogram for malignant neoplasm of breast: Secondary | ICD-10-CM

## 2021-04-13 DIAGNOSIS — M5459 Other low back pain: Secondary | ICD-10-CM | POA: Diagnosis not present

## 2021-04-13 DIAGNOSIS — Z981 Arthrodesis status: Secondary | ICD-10-CM | POA: Diagnosis not present

## 2021-06-23 DIAGNOSIS — L821 Other seborrheic keratosis: Secondary | ICD-10-CM | POA: Diagnosis not present

## 2021-06-23 DIAGNOSIS — Z808 Family history of malignant neoplasm of other organs or systems: Secondary | ICD-10-CM | POA: Diagnosis not present

## 2021-06-23 DIAGNOSIS — L408 Other psoriasis: Secondary | ICD-10-CM | POA: Diagnosis not present

## 2021-06-23 DIAGNOSIS — L719 Rosacea, unspecified: Secondary | ICD-10-CM | POA: Diagnosis not present

## 2021-06-23 DIAGNOSIS — Z85828 Personal history of other malignant neoplasm of skin: Secondary | ICD-10-CM | POA: Diagnosis not present

## 2021-06-23 DIAGNOSIS — D2271 Melanocytic nevi of right lower limb, including hip: Secondary | ICD-10-CM | POA: Diagnosis not present

## 2021-06-23 DIAGNOSIS — D225 Melanocytic nevi of trunk: Secondary | ICD-10-CM | POA: Diagnosis not present

## 2021-06-23 DIAGNOSIS — L304 Erythema intertrigo: Secondary | ICD-10-CM | POA: Diagnosis not present

## 2021-06-23 DIAGNOSIS — D2272 Melanocytic nevi of left lower limb, including hip: Secondary | ICD-10-CM | POA: Diagnosis not present

## 2021-06-23 DIAGNOSIS — Z86018 Personal history of other benign neoplasm: Secondary | ICD-10-CM | POA: Diagnosis not present

## 2021-07-14 ENCOUNTER — Other Ambulatory Visit: Payer: Self-pay

## 2021-07-14 ENCOUNTER — Ambulatory Visit: Payer: Medicare HMO

## 2021-07-14 DIAGNOSIS — I6522 Occlusion and stenosis of left carotid artery: Secondary | ICD-10-CM

## 2021-07-15 NOTE — Progress Notes (Signed)
Seeing you soon and you can order the test at that time

## 2021-07-20 ENCOUNTER — Other Ambulatory Visit: Payer: Self-pay | Admitting: Cardiology

## 2021-07-20 DIAGNOSIS — I6522 Occlusion and stenosis of left carotid artery: Secondary | ICD-10-CM

## 2021-07-21 NOTE — Progress Notes (Signed)
No answer wasn't able to leave vm

## 2021-07-23 ENCOUNTER — Other Ambulatory Visit: Payer: Self-pay

## 2021-07-23 ENCOUNTER — Encounter: Payer: Self-pay | Admitting: Cardiology

## 2021-07-23 ENCOUNTER — Ambulatory Visit: Payer: Medicare HMO | Admitting: Cardiology

## 2021-07-23 VITALS — BP 139/72 | HR 63 | Temp 97.2°F | Resp 16 | Ht 67.0 in | Wt 174.0 lb

## 2021-07-23 DIAGNOSIS — I6522 Occlusion and stenosis of left carotid artery: Secondary | ICD-10-CM

## 2021-07-23 DIAGNOSIS — R072 Precordial pain: Secondary | ICD-10-CM | POA: Diagnosis not present

## 2021-07-23 DIAGNOSIS — Z8616 Personal history of COVID-19: Secondary | ICD-10-CM

## 2021-07-23 DIAGNOSIS — I10 Essential (primary) hypertension: Secondary | ICD-10-CM

## 2021-07-23 DIAGNOSIS — E782 Mixed hyperlipidemia: Secondary | ICD-10-CM | POA: Diagnosis not present

## 2021-07-23 MED ORDER — ROSUVASTATIN CALCIUM 20 MG PO TABS
20.0000 mg | ORAL_TABLET | Freq: Every day | ORAL | 0 refills | Status: DC
Start: 2021-07-23 — End: 2021-08-23

## 2021-07-23 NOTE — Progress Notes (Signed)
Patient came in for a ov

## 2021-07-23 NOTE — Progress Notes (Signed)
 Kimberly Lowery Date of Birth: 01/22/1946 MRN: [id number] Primary Care Provider:Gates, Robert, MD Former Cardiology Providers: Ashton Kelley, APRN, FNP-C  Primary Cardiologist: Sunit Tolia, DO, FACC (established care 03/04/2020)  Date: 07/23/21 Last Office Visit: 10/28/2020   Chief Complaint  Patient presents with   Left carotid stenosis   Results   Follow-up   Chest Pain    HPI  Kimberly Lowery is a 75 y.o.  female who presents to the office with a chief complaint of " follow up for carotid artery disease and chest pain evaluation." Patient's past medical history and cardiovascular risk factors include: left carotid artery disease, Hx of COVID 19 (Jan 2022), hypertension, hyperlipidemia, postmenopausal female, advanced age.   Patient is scheduled for 1 year follow-up but comes in sooner to have her carotid disease reevaluated and to discuss chest pain.  She is known to have left carotid artery stenosis and was scheduled for a annual carotid duplex in December 2022 but that the study was performed prior to today's visit for reevaluation.  Carotid duplex results reviewed with the patient and the disease remains relatively stable involving the left ICA.  Patient had labs with her PCP in April 2022 which I do not have a copy of during today's office visit for review.  Chest pain: Ongoing for the last couple months, located on the left side, present every day, constant, intensity 3 out of 10, describes it as a punch like sensation, radiates to the left mid axillary region, nonexertional, does not resolve with resting.  No significant change in functional status.  Patient states that she has been having increased anxiety/depression which may be contributing to her symptoms as well.  ALLERGIES: Allergies  Allergen Reactions   Shellfish Allergy Hives and Nausea And Vomiting    SHRIMP LOBSTER   Tape Other (See Comments)    causes blisters   Codeine Nausea And Vomiting   Dilaudid  [Hydromorphone Hcl] Nausea And Vomiting   Florastor Kids [Saccharomyces Boulardii] Other (See Comments)    "SIGNIFICANT CONSTIPATION"   Garlic Other (See Comments)    Stomach Upset   Morphine Sulfate Nausea And Vomiting   Nucynta [Tapentadol] Other (See Comments)    hallucinations    Percocet [Oxycodone-Acetaminophen] Nausea And Vomiting     MEDICATION LIST PRIOR TO VISIT: Current Outpatient Medications on File Prior to Visit  Medication Sig Dispense Refill   acetaminophen (TYLENOL) 325 MG tablet Take 650 mg by mouth every 6 (six) hours as needed for mild pain or headache.     amoxicillin (AMOXIL) 500 MG tablet Take 2,000 mg by mouth See admin instructions. Before dental procedures      aspirin EC 81 MG tablet Take 81 mg by mouth at bedtime.      Cholecalciferol (VITAMIN D3) 5000 units TABS Take 5,000 Units by mouth at bedtime.      Cyanocobalamin (VITAMIN B-12) 5000 MCG TBDP Take 5,000 mcg by mouth at bedtime.      diclofenac (VOLTAREN) 75 MG EC tablet Take by mouth.     FLUoxetine (PROZAC) 20 MG capsule Take 20 mg by mouth at bedtime.      losartan (COZAAR) 50 MG tablet Take 50 mg by mouth every evening.     traMADol (ULTRAM) 50 MG tablet take 1-2  tablet by oral route  every 8 hours as needed     No current facility-administered medications on file prior to visit.    PAST MEDICAL HISTORY: Past Medical History:  Diagnosis Date     Anxiety    Arthritis    "some in my back; was in my hips" (03/22/2016)   Asthma    Chronic lower back pain    Depression    Difficult intubation    Family history of anesthesia complication    "my aunt didn't go out during OR"   Fibrocystic breast disease    H/O hiatal hernia    High cholesterol    HLD (hyperlipidemia) 05/08/2019   Hypertension    IBS (irritable bowel syndrome)    Obesity    PONV (postoperative nausea and vomiting)    bad after last hip replacement   Sleep apnea    "never RX mask" (03/22/2016)    PAST SURGICAL HISTORY: Past  Surgical History:  Procedure Laterality Date   BACK SURGERY     BILATERAL TEMPOROMANDIBULAR JOINT ARTHROPLASTY Bilateral 2002   BREAST CYST ASPIRATION Left 1980's   COLONOSCOPY W/ POLYPECTOMY  ~ 2007   DEBRIDEMENT TENNIS ELBOW Right 1990   DILATION AND CURETTAGE OF UTERUS  1980's X 2   EYE SURGERY Bilateral 1974   "Pinguecula"   FINGER SURGERY Right    index cyst removed   JOINT REPLACEMENT     KNEE ARTHROSCOPY WITH SUBCHONDROPLASTY Left 05/08/2018   Procedure: KNEE ARTHROSCOPY WITH SUBCHONDROPLASTY;  Surgeon: Dalldorf, Peter, MD;  Location: [facility] OR;  Service: Orthopedics;  Laterality: Left;   LAPAROSCOPIC CHOLECYSTECTOMY  1999   LUMBAR DISC SURGERY  01/2009   LUMBAR FUSION  08/2009   LUMBAR LAMINECTOMY  1981   MASS EXCISION Left 04/10/2017   Procedure: EXCISION OF LEFT INDEX FINGER CYST;  Surgeon: Thompson, David, MD;  Location: [facility];  Service: Orthopedics;  Laterality: Left;   TONSILLECTOMY  1983   TOTAL ABDOMINAL HYSTERECTOMY  1996   TOTAL HIP ARTHROPLASTY Right 03/18/2014   Procedure: RIGHT TOTAL HIP ARTHROPLASTY ANTERIOR APPROACH;  Surgeon: Peter G Dalldorf, MD;  Location: [facility] OR;  Service: Orthopedics;  Laterality: Right;   TOTAL HIP ARTHROPLASTY Left 03/22/2016   TOTAL HIP ARTHROPLASTY Left 03/22/2016   Procedure: TOTAL HIP ARTHROPLASTY ANTERIOR APPROACH;  Surgeon: Peter Dalldorf, MD;  Location: [facility] OR;  Service: Orthopedics;  Laterality: Left;   TOTAL KNEE ARTHROPLASTY Left 11/13/2018   Procedure: TOTAL KNEE ARTHROPLASTY;  Surgeon: Dalldorf, Peter, MD;  Location: [facility] OR;  Service: Orthopedics;  Laterality: Left;   TUBAL LIGATION  1981    FAMILY HISTORY: The patient's family history includes Colon cancer in her father; Heart disease in her mother.   SOCIAL HISTORY:  The patient  reports that she has never smoked. She has never used smokeless tobacco. She reports current alcohol use. She reports that she does not use drugs.  Review of Systems  Constitutional:  Negative for chills and fever.  HENT:  Negative for hoarse voice and nosebleeds.   Eyes:  Negative for discharge, double vision and pain.  Cardiovascular:  Negative for chest pain, claudication, dyspnea on exertion, leg swelling, near-syncope, orthopnea, palpitations, paroxysmal nocturnal dyspnea and syncope.  Respiratory:  Negative for hemoptysis and shortness of breath.   Musculoskeletal:  Negative for muscle cramps and myalgias.  Gastrointestinal:  Negative for abdominal pain, constipation, diarrhea, hematemesis, hematochezia, melena, nausea and vomiting.  Neurological:  Negative for dizziness and light-headedness.  PHYSICAL EXAM: Vitals with BMI 07/23/2021 10/28/2020 10/12/2020  Height 5' 7" 5' 7" -  Weight 174 lbs 174 lbs -  BMI 27.25 27.25 -  Systolic 139 121 157  Diastolic 72 71 86  Pulse 63 71 72     CONSTITUTIONAL: Well-developed and well-nourished. No acute distress.  SKIN: Skin is warm and dry. No rash noted. No cyanosis. No pallor. No jaundice HEAD: Normocephalic and atraumatic.  EYES: No scleral icterus MOUTH/THROAT: Moist oral membranes.  NECK: No JVD present. No thyromegaly noted. Left carotid bruit.  LYMPHATIC: No visible cervical adenopathy.  CHEST Normal respiratory effort. No intercostal retractions  LUNGS: Clear to auscultation bilaterally.  No stridor. No wheezes. No rales.  CARDIOVASCULAR: Regular rate and rhythm, positive S1-S2, no murmurs rubs or gallops appreciated. ABDOMINAL: Soft, nontender, nondistended, positive bowel sounds in all 4 quadrants, binder/brace in place.  No apparent ascites.  EXTREMITIES: No peripheral edema  HEMATOLOGIC: No significant bruising NEUROLOGIC: Oriented to person, place, and time. Nonfocal. Normal muscle tone.  PSYCHIATRIC: Normal mood and affect. Normal behavior. Cooperative  CARDIAC DATABASE: EKG: 07/23/2021: Normal sinus rhythm, 61 bpm, normal axis, without underlying ischemia or injury  pattern.  Echocardiogram: 03/05/2020: Normal LV systolic function with visual EF 60-65%. Left ventricle cavity is normal in size. Normal global wall motion. Normal diastolic filling pattern, normal LAP. Calculated EF 63%. Mild (Grade I) mitral regurgitation. Mild tricuspid regurgitation. Mild pulmonic regurgitation. No prior study for comparison.  Stress Testing:  Lexiscan/modified Bruce Tetrofosmin stress test 04/13/2020: Normal myocardial perfusion. Stress LVEF calculated 44%, although visually appears normal. Low risk study.  Heart Catheterization: None  Carotid artery duplex 07/14/2021:  Duplex suggests stenosis in the right internal carotid artery (1-15%). Duplex suggests stenosis in the left internal carotid artery (50-69%). Antegrade right vertebral artery flow. Antegrade left vertebral artery flow. No significant change since prior studies dating back to 03/09/2020. Follow up in six months is appropriate if clinically indicated.  LABORATORY DATA: CBC Latest Ref Rng & Units 04/10/2020 11/02/2018 04/30/2018  WBC 4.0 - 10.5 K/uL 7.5 7.9 5.5  Hemoglobin 12.0 - 15.0 g/dL 12.9 13.4 13.1  Hematocrit 36.0 - 46.0 % 39.8 42.7 42.2  Platelets 150 - 400 K/uL 251 278 257    CMP Latest Ref Rng & Units 04/10/2020 06/12/2019 11/02/2018  Glucose 70 - 99 mg/dL 99 103(H) 110(H)  BUN 8 - 23 mg/dL _0 Creatinine 0.44 - 1.00 mg/dL 0.89 0.80 0.84  Sodium 135 - 145 mmol/L 141 143 142  Potassium 3.5 - 5.1 mmol/L 3.9 5.1 4.2  Chloride 98 - 111 mmol/L 105 105 106  CO2 22 - 32 mmol/L _1 Calcium 8.9 - 10.3 mg/dL 9.0 9.0 9.3  Total Protein 6.5 - 8.1 g/dL - - -  Total Bilirubin 0.3 - 1.2 mg/dL - - -  Alkaline Phos 38 - 126 U/L - - -  AST 15 - 41 U/L - - -  ALT 0 - 44 U/L - - -    External Labs: 02/20/2019: CBC normal, total cholesterol 181, triglycerides 180, HDL 58, LDL 87, non-HDL cholesterol 123.  Serum glucose 101 mg, BUN 19, creatinine 0.73, EGFR 79 mL, potassium 5.1.  CMP otherwise  normal.  Collected: January 23, 2020 Creatinine 0.73 mg/dL. Lipid profile: Total cholesterol 170 triglycerides 154, HDL 63, LDL 80, TSH: 2.0  Cardiac Panel (last 3 results) No results for input(s): CKTOTAL, CKMB, TROPONINIHS, RELINDX in the last 72 hours.  IMPRESSION:    ICD-10-CM   1. Precordial pain  R07.2 PCV ECHOCARDIOGRAM COMPLETE    PCV CARDIAC STRESS TEST    2. Left carotid stenosis  I65.22 EKG 12-Lead    rosuvastatin (CRESTOR) 20 MG tablet    3. Mixed hyperlipidemia  E78.2 rosuvastatin (CRESTOR) 20 MG tablet  4. Essential hypertension  I10     5. History of COVID-19  Z86.16        RECOMMENDATIONS: Kimberly Lowery is a 75 y.o. female whose past medical history and cardiovascular risk factors include: left carotid artery disease, hypertension, hyperlipidemia, postmenopausal female, advanced age.   Precordial pain Atypical EKG nonischemic Recently had a nuclear stress test in July 2021 results reviewed with her in great detail.  Patient states that her symptoms are not as severe as back in July 2021 and therefore the shared decision was to proceed with an echocardiogram and exercise treadmill stress test.  Further recommendations pending.  Left carotid stenosis Stable Most recent carotid duplex results reviewed. Follow-up study scheduled for October 2023 Continue aspirin and statin therapy Increase Crestor to 20 mg p.o. nightly. Patient is requested to bring her annual labs to her visit when possible.   Mixed hyperlipidemia Currently on Crestor.   She denies myalgia or other side effects. Most recent lipids dated recently had labs with PCP in April 2021.  Results requested.   Essential hypertension Blood pressure within acceptable range. Medications reconciled. Reemphasized importance of low-salt diet. Continue to monitor.  FINAL MEDICATION LIST END OF ENCOUNTER: Meds ordered this encounter  Medications   rosuvastatin (CRESTOR) 20 MG tablet    Sig:  Take 1 tablet (20 mg total) by mouth at bedtime.    Dispense:  90 tablet    Refill:  0     Current Outpatient Medications:    acetaminophen (TYLENOL) 325 MG tablet, Take 650 mg by mouth every 6 (six) hours as needed for mild pain or headache., Disp: , Rfl:    amoxicillin (AMOXIL) 500 MG tablet, Take 2,000 mg by mouth See admin instructions. Before dental procedures , Disp: , Rfl:    aspirin EC 81 MG tablet, Take 81 mg by mouth at bedtime. , Disp: , Rfl:    Cholecalciferol (VITAMIN D3) 5000 units TABS, Take 5,000 Units by mouth at bedtime. , Disp: , Rfl:    Cyanocobalamin (VITAMIN B-12) 5000 MCG TBDP, Take 5,000 mcg by mouth at bedtime. , Disp: , Rfl:    diclofenac (VOLTAREN) 75 MG EC tablet, Take by mouth., Disp: , Rfl:    FLUoxetine (PROZAC) 20 MG capsule, Take 20 mg by mouth at bedtime. , Disp: , Rfl:    losartan (COZAAR) 50 MG tablet, Take 50 mg by mouth every evening., Disp: , Rfl:    traMADol (ULTRAM) 50 MG tablet, take 1-2  tablet by oral route  every 8 hours as needed, Disp: , Rfl:    rosuvastatin (CRESTOR) 20 MG tablet, Take 1 tablet (20 mg total) by mouth at bedtime., Disp: 90 tablet, Rfl: 0  Orders Placed This Encounter  Procedures   PCV CARDIAC STRESS TEST   EKG 12-Lead   PCV ECHOCARDIOGRAM COMPLETE   --Continue cardiac medications as reconciled in final medication list. --Return in about 4 weeks (around 08/20/2021) for Follow up, Chest pain, Review test results. Or sooner if needed. --Continue follow-up with your primary care physician regarding the management of your other chronic comorbid conditions.  Patient's questions and concerns were addressed to her satisfaction. She voices understanding of the instructions provided during this encounter.   This note was created using a voice recognition software as a result there may be grammatical errors inadvertently enclosed that do not reflect the nature of this encounter. Every attempt is made to correct such errors.  Total  time spent: 33 minutes.   Geovani Tootle [place],  DO, [facility]  Pager: [phone] Office: [phone]

## 2021-08-06 DIAGNOSIS — Z0000 Encounter for general adult medical examination without abnormal findings: Secondary | ICD-10-CM | POA: Diagnosis not present

## 2021-08-09 ENCOUNTER — Ambulatory Visit: Payer: Medicare HMO

## 2021-08-09 ENCOUNTER — Other Ambulatory Visit: Payer: Self-pay

## 2021-08-09 DIAGNOSIS — R072 Precordial pain: Secondary | ICD-10-CM | POA: Diagnosis not present

## 2021-08-11 ENCOUNTER — Other Ambulatory Visit: Payer: Medicare HMO

## 2021-08-12 DIAGNOSIS — Z23 Encounter for immunization: Secondary | ICD-10-CM | POA: Diagnosis not present

## 2021-08-13 ENCOUNTER — Other Ambulatory Visit: Payer: Self-pay | Admitting: Cardiology

## 2021-08-17 DIAGNOSIS — H25813 Combined forms of age-related cataract, bilateral: Secondary | ICD-10-CM | POA: Diagnosis not present

## 2021-08-17 DIAGNOSIS — H01001 Unspecified blepharitis right upper eyelid: Secondary | ICD-10-CM | POA: Diagnosis not present

## 2021-08-19 DIAGNOSIS — Z0100 Encounter for examination of eyes and vision without abnormal findings: Secondary | ICD-10-CM | POA: Diagnosis not present

## 2021-08-23 ENCOUNTER — Other Ambulatory Visit: Payer: Self-pay

## 2021-08-23 ENCOUNTER — Encounter: Payer: Self-pay | Admitting: Cardiology

## 2021-08-23 ENCOUNTER — Ambulatory Visit: Payer: Medicare HMO | Admitting: Cardiology

## 2021-08-23 DIAGNOSIS — E782 Mixed hyperlipidemia: Secondary | ICD-10-CM | POA: Diagnosis not present

## 2021-08-23 DIAGNOSIS — I6522 Occlusion and stenosis of left carotid artery: Secondary | ICD-10-CM | POA: Diagnosis not present

## 2021-08-23 MED ORDER — ROSUVASTATIN CALCIUM 40 MG PO TABS: 40.0000 mg | ORAL_TABLET | Freq: Every day | ORAL | 3 refills | Status: DC

## 2021-08-23 NOTE — Progress Notes (Signed)
Kimberly Lowery Date of Birth: 25-Nov-1945 MRN: [id number] Primary Care Provider:Gates, [name], MD Former Cardiology Providers: Jeri Lager, APRN, FNP-C  Primary Cardiologist: Alethia Berthold, PA-C, [facility] (established care 03/04/2020)  Date: 07/23/21 Last Office Visit: 10/28/2020   Chief Complaint  Patient presents with   Chest Pain   Results   Follow-up    4 weeks    HPI  Kimberly Lowery is a 75 y.o.  female who presents to the office with a chief complaint of " follow up for carotid artery disease and chest pain evaluation." Patient's past medical history and cardiovascular risk factors include: left carotid artery disease, Hx of COVID 19 (Jan 2022), hypertension, hyperlipidemia, postmenopausal female, advanced age.   Patient had previously presented for visit earlier than scheduled with concerns of chest pain and to discuss carotid artery stenosis on 07/23/2021. Patient's left carotid stenosis remained stable, given carotid disease patient was advised to increase Crestor from 20 mg to 40 mg daily. She has now been taking Crestor 40 mg daily for the last 2 weeks and is tolerating this without issue.   Chest pain: Last office visit patient had reported intermittent left-sided chest pain ongoing for the last 2 to 3 months, nonexertional, radiating to left mid axillary region, less intense than pain noted in July 2021.  Patient admitted to high levels of stress and anxiety likely contributing to her symptoms.   At last visit had ordered treadmill stress test and echocardiogram to evaluate further.  Echocardiogram revealed preserved LVEF without significant abnormalities, unchanged compared to previous and 03/2020.  Treadmill cardiac stress test showed reduced exercise capacity and was equivocal for ischemia.   Patient reports since last office visit chest pain symptoms have improved significantly. She reports since last visit she has only had a couple episodes of chest pain  associated with a significant episode of stress and anxiety when her granddaughter was in the hospital.   ALLERGIES: Allergies  Allergen Reactions   Shellfish Allergy Hives and Nausea And Vomiting    SHRIMP LOBSTER   Tape Other (See Comments)    causes blisters   Codeine Nausea And Vomiting   Dilaudid [Hydromorphone Hcl] Nausea And Vomiting   Florastor Kids [Saccharomyces Boulardii] Other (See Comments)    "SIGNIFICANT CONSTIPATION"   Garlic Other (See Comments)    Stomach Upset   Morphine Sulfate Nausea And Vomiting   Nucynta [Tapentadol] Other (See Comments)    hallucinations    Percocet [Oxycodone-Acetaminophen] Nausea And Vomiting   MEDICATION LIST PRIOR TO VISIT: Current Outpatient Medications on File Prior to Visit  Medication Sig Dispense Refill   acetaminophen (TYLENOL) 325 MG tablet Take 650 mg by mouth every 6 (six) hours as needed for mild pain or headache.     aspirin EC 81 MG tablet Take 81 mg by mouth at bedtime.      Cholecalciferol (VITAMIN D3) 5000 units TABS Take 5,000 Units by mouth at bedtime.      Cyanocobalamin (VITAMIN B-12) 5000 MCG TBDP Take 5,000 mcg by mouth at bedtime.      diclofenac (VOLTAREN) 75 MG EC tablet Take by mouth.     FLUoxetine (PROZAC) 20 MG capsule Take 20 mg by mouth at bedtime.      losartan (COZAAR) 50 MG tablet TAKE 1 TABLET BY MOUTH EVERY DAY 90 tablet 3   traMADol (ULTRAM) 50 MG tablet take 1-2  tablet by oral route  every 8 hours as needed     amoxicillin (AMOXIL) 500 MG  tablet Take 2,000 mg by mouth See admin instructions. Before dental procedures  (Patient not taking: Reported on 08/23/2021)     No current facility-administered medications on file prior to visit.   PAST MEDICAL HISTORY: Past Medical History:  Diagnosis Date   Anxiety    Arthritis    "some in my back; was in my hips" (03/22/2016)   Asthma    Chronic lower back pain    Depression    Difficult intubation    Family history of anesthesia complication    "my  aunt didn't go out during OR"   Fibrocystic breast disease    H/O hiatal hernia    High cholesterol    HLD (hyperlipidemia) 05/08/2019   Hypertension    IBS (irritable bowel syndrome)    Obesity    PONV (postoperative nausea and vomiting)    bad after last hip replacement   Sleep apnea    "never RX mask" (03/22/2016)   PAST SURGICAL HISTORY: Past Surgical History:  Procedure Laterality Date   BACK SURGERY     BILATERAL TEMPOROMANDIBULAR JOINT ARTHROPLASTY Bilateral 2002   BREAST CYST ASPIRATION Left 1980's   COLONOSCOPY W/ POLYPECTOMY  ~ 2007   DEBRIDEMENT TENNIS ELBOW Right 1990   DILATION AND CURETTAGE OF UTERUS  1980's X 2   EYE SURGERY Bilateral 1974   "Pinguecula"   FINGER SURGERY Right    index cyst removed   JOINT REPLACEMENT     KNEE ARTHROSCOPY WITH SUBCHONDROPLASTY Left 05/08/2018   Procedure: KNEE ARTHROSCOPY WITH SUBCHONDROPLASTY;  Surgeon: Melrose Nakayama, MD;  Location: [facility];  Service: Orthopedics;  Laterality: Left;   [place] SURGERY  01/2009   LUMBAR FUSION  08/2009   LUMBAR LAMINECTOMY  1981   MASS EXCISION Left 04/10/2017   Procedure: EXCISION OF LEFT INDEX FINGER CYST;  Surgeon: Milly Jakob, MD;  Location: [facility];  Service: Orthopedics;  Laterality: Left;   [place]   TOTAL HIP ARTHROPLASTY Right 03/18/2014   Procedure: RIGHT TOTAL HIP ARTHROPLASTY ANTERIOR APPROACH;  Surgeon: Hessie Dibble, MD;  Location: [facility];  Service: Orthopedics;  Laterality: Right;   TOTAL HIP ARTHROPLASTY Left 03/22/2016   TOTAL HIP ARTHROPLASTY Left 03/22/2016   Procedure: TOTAL HIP ARTHROPLASTY ANTERIOR APPROACH;  Surgeon: Melrose Nakayama, MD;  Location: [facility];  Service: Orthopedics;  Laterality: Left;   TOTAL KNEE ARTHROPLASTY Left 11/13/2018   Procedure: TOTAL KNEE ARTHROPLASTY;  Surgeon: Melrose Nakayama, MD;  Location: [facility];  Service: Orthopedics;  Laterality: Left;    TUBAL LIGATION  1981   FAMILY HISTORY: The patient's family history includes Colon cancer in her father; Heart disease in her mother.   SOCIAL HISTORY:  The patient  reports that she has never smoked. She has never used smokeless tobacco. She reports current alcohol use. She reports that she does not use drugs.  Review of Systems  Constitutional: Negative for chills and fever.  HENT:  Negative for hoarse voice and nosebleeds.   Eyes:  Negative for discharge, double vision and pain.  Cardiovascular:  Negative for chest pain (improved significantly), claudication, dyspnea on exertion, leg swelling, near-syncope, orthopnea, palpitations, paroxysmal nocturnal dyspnea and syncope.  Respiratory:  Negative for hemoptysis and shortness of breath.   Musculoskeletal:  Negative for muscle cramps and myalgias.  Gastrointestinal:  Negative for abdominal pain, constipation, diarrhea, hematemesis, hematochezia, melena, nausea and vomiting.  Neurological:  Negative for dizziness and light-headedness.  PHYSICAL EXAM: Vitals with BMI 08/23/2021 07/23/2021 10/28/2020  Height 5' 7"  5' 7"  5' 7"   Weight 176 lbs 3 oz 174 lbs 174 lbs  BMI 27.59 57.47 34.03  Systolic 709 643 838  Diastolic 80 72 71  Pulse 76 63 71   CONSTITUTIONAL: Well-developed and well-nourished. No acute distress.  SKIN: Skin is warm and dry. No rash noted. No cyanosis. No pallor. No jaundice HEAD: Normocephalic and atraumatic.  EYES: No scleral icterus MOUTH/THROAT: Moist oral membranes.  NECK: No JVD present. No thyromegaly noted. Left carotid bruit.  LYMPHATIC: No visible cervical adenopathy.  CHEST Normal respiratory effort. No intercostal retractions  LUNGS: Clear to auscultation bilaterally.  No stridor. No wheezes. No rales.  CARDIOVASCULAR: Regular rate and rhythm, positive S1-S2, no murmurs rubs or gallops appreciated. ABDOMINAL: Soft, nontender, nondistended, positive bowel sounds in all 4 quadrants, binder/brace in place.   No apparent ascites.  EXTREMITIES: No peripheral edema  HEMATOLOGIC: No significant bruising NEUROLOGIC: Oriented to person, place, and time. Nonfocal. Normal muscle tone.  PSYCHIATRIC: Normal mood and affect. Normal behavior. Cooperative Physical exam is unchanged compared to pervious office visit.   CARDIAC DATABASE: EKG: 07/23/2021: Normal sinus rhythm, 61 bpm, normal axis, without underlying ischemia or injury pattern.  Echocardiogram: 03/05/2020: Normal LV systolic function with visual EF 60-65%. Left ventricle cavity is normal in size. Normal global wall motion. Normal diastolic filling pattern, normal LAP. Calculated EF 63%. Mild (Grade I) mitral regurgitation. Mild tricuspid regurgitation. Mild pulmonic regurgitation. No prior study for comparison.  08/09/2021:  Normal LV systolic function with visual EF 60-65%. Left ventricle cavity is normal in size. Normal left ventricular wall thickness. Normal global wall motion. Normal diastolic filling pattern, normal LAP.  Mild tricuspid regurgitation. No evidence of pulmonary hypertension.  Mild pulmonic regurgitation.  Compared to prior study 03/05/2020 no significant change, except MR is now trivial.  Stress Testing:  Lexiscan/modified Bruce Tetrofosmin stress test 04/13/2020: Normal myocardial perfusion. Stress LVEF calculated 44%, although visually appears normal. Low risk study.  Exercise treadmill stress test 08/09/2021: Exercise treadmill stress test performed using Bruce protocol.  Patient reached 6.5 METS, and 99% of age predicted maximum heart rate.  Exercise capacity was low.  Non-limiting chest pain, dyspnea, and dizziness reported. Normal heart rate and hemodynamic response.  Stress EKG showed sinus tachycardia, 1-1/5 mm upsloping ST depressions in leads II, III, aVF, returning to baseline within 1 min into recovery. The changes are at equivocal for ischemia.  Given low exercise capacity and equivocal EKG changes, recommend  clinical correlation.   Heart Catheterization: None  Carotid artery duplex 07/14/2021:  Duplex suggests stenosis in the right internal carotid artery (1-15%). Duplex suggests stenosis in the left internal carotid artery (50-69%). Antegrade right vertebral artery flow. Antegrade left vertebral artery flow. No significant change since prior studies dating back to 03/09/2020. Follow up in six months is appropriate if clinically indicated.  LABORATORY DATA: CBC Latest Ref Rng & Units 04/10/2020 11/02/2018 04/30/2018  WBC 4.0 - 10.5 K/uL 7.5 7.9 5.5  Hemoglobin 12.0 - 15.0 g/dL 12.9 13.4 13.1  Hematocrit 36.0 - 46.0 % 39.8 42.7 42.2  Platelets 150 - 400 K/uL 251 278 257    CMP Latest Ref Rng & Units 04/10/2020 06/12/2019 11/02/2018  Glucose 70 - 99 mg/dL 99 103(H) 110(H)  BUN 8 - 23 mg/dL 15 16 12   Creatinine 0.44 - 1.00 mg/dL 0.89 0.80 0.84  Sodium 135 - 145 mmol/L 141 143 142  Potassium 3.5 - 5.1 mmol/L 3.9 5.1 4.2  Chloride 98 - 111 mmol/L 105 105 106  CO2 22 - 32 mmol/L 27 26 27   Calcium 8.9 - 10.3 mg/dL 9.0 9.0 9.3  Total Protein 6.5 - 8.1 g/dL - - -  Total Bilirubin 0.3 - 1.2 mg/dL - - -  Alkaline Phos 38 - 126 U/L - - -  AST 15 - 41 U/L - - -  ALT 0 - 44 U/L - - -    External Labs: 02/20/2019: CBC normal, total cholesterol 181, triglycerides 180, HDL 58, LDL 87, non-HDL cholesterol 123.  Serum glucose 101 mg, BUN 19, creatinine 0.73, EGFR 79 mL, potassium 5.1.  CMP otherwise normal.  Collected: January 23, 2020 Creatinine 0.73 mg/dL. Lipid profile: Total cholesterol 170 triglycerides 154, HDL 63, LDL 80, TSH: 2.0  Cardiac Panel (last 3 results) No results for input(s): CKTOTAL, CKMB, TROPONINIHS, RELINDX in the last 72 hours.  IMPRESSION:    ICD-10-CM   1. Left carotid stenosis  I65.22 rosuvastatin (CRESTOR) 40 MG tablet    2. Mixed hyperlipidemia  E78.2 rosuvastatin (CRESTOR) 40 MG tablet       RECOMMENDATIONS: Sharnita Bogucki is a 75 y.o. female whose past medical  history and cardiovascular risk factors include: left carotid artery disease, hypertension, hyperlipidemia, postmenopausal female, advanced age.   Precordial pain Atypical and improved since last office visit. Symptoms appear consistent with non-cardiac etiology given reassuring echocardiogram. Stress test was equivocal for ischemia but given improvement of chest pain shared position was to proceed with watchful waiting. Could consider coronary CTA if her symptoms worsen or change in severity or characteristics.   Left carotid stenosis Stable Follow-up study scheduled for October 2023 Continue aspirin and statin therapy Patient is tolerating Crestor 40 mg nightly without issue   Mixed hyperlipidemia Currently on Crestor.   She denies myalgia or other side effects.  Essential hypertension Blood pressure within acceptable range. Medications reconciled. Reemphasized importance of low-salt diet. Continue to monitor.  FINAL MEDICATION LIST END OF ENCOUNTER: Meds ordered this encounter  Medications   rosuvastatin (CRESTOR) 40 MG tablet    Sig: Take 1 tablet (40 mg total) by mouth at bedtime. Increased by Dr. Terri Skains, per patient.    Dispense:  90 tablet    Refill:  3     Current Outpatient Medications:    acetaminophen (TYLENOL) 325 MG tablet, Take 650 mg by mouth every 6 (six) hours as needed for mild pain or headache., Disp: , Rfl:    aspirin EC 81 MG tablet, Take 81 mg by mouth at bedtime. , Disp: , Rfl:    Cholecalciferol (VITAMIN D3) 5000 units TABS, Take 5,000 Units by mouth at bedtime. , Disp: , Rfl:    Cyanocobalamin (VITAMIN B-12) 5000 MCG TBDP, Take 5,000 mcg by mouth at bedtime. , Disp: , Rfl:    diclofenac (VOLTAREN) 75 MG EC tablet, Take by mouth., Disp: , Rfl:    FLUoxetine (PROZAC) 20 MG capsule, Take 20 mg by mouth at bedtime. , Disp: , Rfl:    losartan (COZAAR) 50 MG tablet, TAKE 1 TABLET BY MOUTH EVERY DAY, Disp: 90 tablet, Rfl: 3   traMADol (ULTRAM) 50 MG tablet, take  1-2  tablet by oral route  every 8 hours as needed, Disp: , Rfl:    amoxicillin (AMOXIL) 500 MG tablet, Take 2,000 mg by mouth See admin instructions. Before dental procedures  (Patient not taking: Reported on 08/23/2021), Disp: , Rfl:    rosuvastatin (CRESTOR) 40 MG tablet, Take 1 tablet (40 mg total)  by mouth at bedtime. Increased by Dr. Terri Skains, per patient., Disp: 90 tablet, Rfl: 3  No orders of the defined types were placed in this encounter.  --Continue cardiac medications as reconciled in final medication list. --Return in about 6 months (around 02/20/2022) for carotid stenosis, HLD . Or sooner if needed. --Continue follow-up with your primary care physician regarding the management of your other chronic comorbid conditions.  Patient's questions and concerns were addressed to her satisfaction. She voices understanding of the instructions provided during this encounter.   This note was created using a voice recognition software as a result there may be grammatical errors inadvertently enclosed that do not reflect the nature of this encounter. Every attempt is made to correct such errors.   Alethia Berthold, PA-C 08/23/2021, 2:11 PM Office: [phone]

## 2021-08-25 ENCOUNTER — Other Ambulatory Visit: Payer: Self-pay

## 2021-08-25 DIAGNOSIS — I6522 Occlusion and stenosis of left carotid artery: Secondary | ICD-10-CM

## 2021-09-14 DIAGNOSIS — Z23 Encounter for immunization: Secondary | ICD-10-CM | POA: Diagnosis not present

## 2021-09-20 ENCOUNTER — Other Ambulatory Visit: Payer: Medicare HMO

## 2021-09-28 ENCOUNTER — Ambulatory Visit: Payer: Medicare HMO | Admitting: Cardiology

## 2021-12-16 DIAGNOSIS — J069 Acute upper respiratory infection, unspecified: Secondary | ICD-10-CM | POA: Diagnosis not present

## 2021-12-19 DIAGNOSIS — M7060 Trochanteric bursitis, unspecified hip: Secondary | ICD-10-CM | POA: Diagnosis not present

## 2021-12-19 DIAGNOSIS — M722 Plantar fascial fibromatosis: Secondary | ICD-10-CM | POA: Diagnosis not present

## 2021-12-19 DIAGNOSIS — Z1152 Encounter for screening for COVID-19: Secondary | ICD-10-CM | POA: Diagnosis not present

## 2021-12-19 DIAGNOSIS — J069 Acute upper respiratory infection, unspecified: Secondary | ICD-10-CM | POA: Diagnosis not present

## 2022-02-02 DIAGNOSIS — M25552 Pain in left hip: Secondary | ICD-10-CM | POA: Diagnosis not present

## 2022-02-02 DIAGNOSIS — M25562 Pain in left knee: Secondary | ICD-10-CM | POA: Diagnosis not present

## 2022-02-18 DIAGNOSIS — Z23 Encounter for immunization: Secondary | ICD-10-CM | POA: Diagnosis not present

## 2022-03-08 NOTE — Progress Notes (Deleted)
Kimberly Lowery Date of Birth: 1946/03/21 MRN: [id number] Primary Care Provider:Gates, [name], MD Former Cardiology Providers: Jeri Lager, APRN, FNP-C  Primary Cardiologist: Alethia Berthold, PA-C, [facility] (established care 03/04/2020)  Date: 07/23/21 Last Office Visit: 10/28/2020   No chief complaint on file.   HPI  Kimberly Lowery is a 76 y.o.  female who presents to the office with a chief complaint of " follow up for carotid artery disease and chest pain evaluation." Patient's past medical history and cardiovascular risk factors include: left carotid artery disease, Hx of COVID 19 (Jan 2022), hypertension, hyperlipidemia, postmenopausal female, advanced age.   Patient previously presented with chest pain and underwent echocardiogram which revealed preserved LVEF without significant abnormalities, unchanged compared to previous and 03/2020.  She also had treadmill cardiac stress test showed reduced exercise capacity and was equivocal for ischemia. At last office visit patient's chest pain had significantly improved therefore no further testing was recommended. Also at last office visit patient's carotid artery stenosis was stable and recommended follow up study in 07/2022.   Patient now presents for 6 month follow up. ***  ***Recent lipids?   ALLERGIES: Allergies  Allergen Reactions   Shellfish Allergy Hives and Nausea And Vomiting    SHRIMP LOBSTER   Tape Other (See Comments)    causes blisters   Codeine Nausea And Vomiting   Dilaudid [Hydromorphone Hcl] Nausea And Vomiting   Florastor Kids [Saccharomyces Boulardii] Other (See Comments)    "SIGNIFICANT CONSTIPATION"   Garlic Other (See Comments)    Stomach Upset   Morphine Sulfate Nausea And Vomiting   Nucynta [Tapentadol] Other (See Comments)    hallucinations    Percocet [Oxycodone-Acetaminophen] Nausea And Vomiting   MEDICATION LIST PRIOR TO VISIT: Current Outpatient Medications on File Prior to Visit   Medication Sig Dispense Refill   acetaminophen (TYLENOL) 325 MG tablet Take 650 mg by mouth every 6 (six) hours as needed for mild pain or headache.     amoxicillin (AMOXIL) 500 MG tablet Take 2,000 mg by mouth See admin instructions. Before dental procedures  (Patient not taking: Reported on 08/23/2021)     aspirin EC 81 MG tablet Take 81 mg by mouth at bedtime.      Cholecalciferol (VITAMIN D3) 5000 units TABS Take 5,000 Units by mouth at bedtime.      Cyanocobalamin (VITAMIN B-12) 5000 MCG TBDP Take 5,000 mcg by mouth at bedtime.      diclofenac (VOLTAREN) 75 MG EC tablet Take by mouth.     FLUoxetine (PROZAC) 20 MG capsule Take 20 mg by mouth at bedtime.      losartan (COZAAR) 50 MG tablet TAKE 1 TABLET BY MOUTH EVERY DAY 90 tablet 3   rosuvastatin (CRESTOR) 40 MG tablet Take 1 tablet (40 mg total) by mouth at bedtime. Increased by Dr. Terri Skains, per patient. 90 tablet 3   traMADol (ULTRAM) 50 MG tablet take 1-2  tablet by oral route  every 8 hours as needed     No current facility-administered medications on file prior to visit.   PAST MEDICAL HISTORY: Past Medical History:  Diagnosis Date   Anxiety    Arthritis    "some in my back; was in my hips" (03/22/2016)   Asthma    Chronic lower back pain    Depression    Difficult intubation    Family history of anesthesia complication    "my aunt didn't go out during OR"   Fibrocystic breast disease    H/O  hiatal hernia    High cholesterol    HLD (hyperlipidemia) 05/08/2019   Hypertension    IBS (irritable bowel syndrome)    Obesity    PONV (postoperative nausea and vomiting)    bad after last hip replacement   Sleep apnea    "never RX mask" (03/22/2016)   PAST SURGICAL HISTORY: Past Surgical History:  Procedure Laterality Date   BACK SURGERY     BILATERAL TEMPOROMANDIBULAR JOINT ARTHROPLASTY Bilateral 2002   BREAST CYST ASPIRATION Left 1980's   COLONOSCOPY W/ POLYPECTOMY  ~ 2007   DEBRIDEMENT TENNIS ELBOW Right 1990   DILATION  AND CURETTAGE OF UTERUS  1980's X 2   EYE SURGERY Bilateral 1974   "Pinguecula"   FINGER SURGERY Right    index cyst removed   JOINT REPLACEMENT     KNEE ARTHROSCOPY WITH SUBCHONDROPLASTY Left 05/08/2018   Procedure: KNEE ARTHROSCOPY WITH SUBCHONDROPLASTY;  Surgeon: Melrose Nakayama, MD;  Location: [facility];  Service: Orthopedics;  Laterality: Left;   [place] SURGERY  01/2009   LUMBAR FUSION  08/2009   LUMBAR LAMINECTOMY  1981   MASS EXCISION Left 04/10/2017   Procedure: EXCISION OF LEFT INDEX FINGER CYST;  Surgeon: Milly Jakob, MD;  Location: [facility];  Service: Orthopedics;  Laterality: Left;   [place]   TOTAL HIP ARTHROPLASTY Right 03/18/2014   Procedure: RIGHT TOTAL HIP ARTHROPLASTY ANTERIOR APPROACH;  Surgeon: Hessie Dibble, MD;  Location: [facility];  Service: Orthopedics;  Laterality: Right;   TOTAL HIP ARTHROPLASTY Left 03/22/2016   TOTAL HIP ARTHROPLASTY Left 03/22/2016   Procedure: TOTAL HIP ARTHROPLASTY ANTERIOR APPROACH;  Surgeon: Melrose Nakayama, MD;  Location: [facility];  Service: Orthopedics;  Laterality: Left;   TOTAL KNEE ARTHROPLASTY Left 11/13/2018   Procedure: TOTAL KNEE ARTHROPLASTY;  Surgeon: Melrose Nakayama, MD;  Location: [facility];  Service: Orthopedics;  Laterality: Left;   TUBAL LIGATION  1981   FAMILY HISTORY: The patient's family history includes Colon cancer in her father; Heart disease in her mother.   SOCIAL HISTORY:  The patient  reports that she has never smoked. She has never used smokeless tobacco. She reports current alcohol use. She reports that she does not use drugs.  Review of Systems  Constitutional: Negative for chills and fever.  HENT:  Negative for hoarse voice and nosebleeds.   Eyes:  Negative for discharge, double vision and pain.  Cardiovascular:  Negative for chest pain (improved significantly), claudication, dyspnea on exertion, leg swelling,  near-syncope, orthopnea, palpitations, paroxysmal nocturnal dyspnea and syncope.  Respiratory:  Negative for hemoptysis and shortness of breath.   Musculoskeletal:  Negative for muscle cramps and myalgias.  Gastrointestinal:  Negative for abdominal pain, constipation, diarrhea, hematemesis, hematochezia, melena, nausea and vomiting.  Neurological:  Negative for dizziness and light-headedness.   PHYSICAL EXAM:    08/23/2021    1:10 PM 07/23/2021   10:23 AM 10/28/2020    9:00 AM  Vitals with BMI  Height 5' 7"  5' 7"  5' 7"   Weight 176 lbs 3 oz 174 lbs 174 lbs  BMI 27.59 74.08 14.48  Systolic 185 631 497  Diastolic 80 72 71  Pulse 76 63 71   CONSTITUTIONAL: Well-developed and well-nourished. No acute distress.  SKIN: Skin is warm and dry. No rash noted. No cyanosis. No pallor. No jaundice HEAD: Normocephalic and atraumatic.  EYES: No scleral icterus MOUTH/THROAT: Moist oral membranes.  NECK: No JVD  present. No thyromegaly noted. Left carotid bruit.  LYMPHATIC: No visible cervical adenopathy.  CHEST Normal respiratory effort. No intercostal retractions  LUNGS: Clear to auscultation bilaterally.  No stridor. No wheezes. No rales.  CARDIOVASCULAR: Regular rate and rhythm, positive S1-S2, no murmurs rubs or gallops appreciated. ABDOMINAL: Soft, nontender, nondistended, positive bowel sounds in all 4 quadrants, binder/brace in place.  No apparent ascites.  EXTREMITIES: No peripheral edema  HEMATOLOGIC: No significant bruising NEUROLOGIC: Oriented to person, place, and time. Nonfocal. Normal muscle tone.  PSYCHIATRIC: Normal mood and affect. Normal behavior. Cooperative ***Physical exam is unchanged compared to pervious office visit.   CARDIAC DATABASE: EKG: 07/23/2021: Normal sinus rhythm, 61 bpm, normal axis, without underlying ischemia or injury pattern. ** *  Echocardiogram: 03/05/2020: Normal LV systolic function with visual EF 60-65%. Left ventricle cavity is normal in size.  Normal global wall motion. Normal diastolic filling pattern, normal LAP. Calculated EF 63%. Mild (Grade I) mitral regurgitation. Mild tricuspid regurgitation. Mild pulmonic regurgitation. No prior study for comparison.  08/09/2021:  Normal LV systolic function with visual EF 60-65%. Left ventricle cavity is normal in size. Normal left ventricular wall thickness. Normal global wall motion. Normal diastolic filling pattern, normal LAP.  Mild tricuspid regurgitation. No evidence of pulmonary hypertension.  Mild pulmonic regurgitation.  Compared to prior study 03/05/2020 no significant change, except MR is now trivial.  Stress Testing:  Lexiscan/modified Bruce Tetrofosmin stress test 04/13/2020: Normal myocardial perfusion. Stress LVEF calculated 44%, although visually appears normal. Low risk study.  Exercise treadmill stress test 08/09/2021: Exercise treadmill stress test performed using Bruce protocol.  Patient reached 6.5 METS, and 99% of age predicted maximum heart rate.  Exercise capacity was low.  Non-limiting chest pain, dyspnea, and dizziness reported. Normal heart rate and hemodynamic response.  Stress EKG showed sinus tachycardia, 1-1/5 mm upsloping ST depressions in leads II, III, aVF, returning to baseline within 1 min into recovery. The changes are at equivocal for ischemia.  Given low exercise capacity and equivocal EKG changes, recommend clinical correlation.   Heart Catheterization: None  Carotid artery duplex 07/14/2021:  Duplex suggests stenosis in the right internal carotid artery (1-15%). Duplex suggests stenosis in the left internal carotid artery (50-69%). Antegrade right vertebral artery flow. Antegrade left vertebral artery flow. No significant change since prior studies dating back to 03/09/2020. Follow up in six months is appropriate if clinically indicated.  LABORATORY DATA:    Latest Ref Rng & Units 04/10/2020    3:04 PM 06/12/2019    9:37 AM 11/02/2018    1:36 PM   CMP  Glucose 70 - 99 mg/dL 99   103   110    BUN 8 - 23 mg/dL 15   16   12     Creatinine 0.44 - 1.00 mg/dL 0.89   0.80   0.84    Sodium 135 - 145 mmol/L 141   143   142    Potassium 3.5 - 5.1 mmol/L 3.9   5.1   4.2    Chloride 98 - 111 mmol/L 105   105   106    CO2 22 - 32 mmol/L 27   26   27     Calcium 8.9 - 10.3 mg/dL 9.0   9.0   9.3        Latest Ref Rng & Units 04/10/2020    3:04 PM 11/02/2018    1:36 PM 04/30/2018   11:02 AM  CBC  WBC 4.0 - 10.5 K/uL 7.5   7.9  5.5    Hemoglobin 12.0 - 15.0 g/dL 12.9   13.4   13.1    Hematocrit 36.0 - 46.0 % 39.8   42.7   42.2    Platelets 150 - 400 K/uL 251   278   257     Lipid Panel  No results found for: CHOL, TRIG, HDL, CHOLHDL, VLDL, LDLCALC, LDLDIRECT HEMOGLOBIN A1C No results found for: HGBA1C, MPG TSH No results for input(s): TSH in the last 8760 hours.  External Labs: 02/20/2019: CBC normal, total cholesterol 181, triglycerides 180, HDL 58, LDL 87, non-HDL cholesterol 123.  Serum glucose 101 mg, BUN 19, creatinine 0.73, EGFR 79 mL, potassium 5.1.  CMP otherwise normal.  Collected: January 23, 2020 Creatinine 0.73 mg/dL. Lipid profile: Total cholesterol 170 triglycerides 154, HDL 63, LDL 80, TSH: 2.0  Cardiac Panel (last 3 results) No results for input(s): CKTOTAL, CKMB, TROPONINIHS, RELINDX in the last 72 hours.  IMPRESSION:  No diagnosis found.    RECOMMENDATIONS: Kimberly Lowery is a 76 y.o. female whose past medical history and cardiovascular risk factors include: left carotid artery disease, hypertension, hyperlipidemia, postmenopausal female, advanced age.  *** Precordial pain Atypical and improved since last office visit. Symptoms appear consistent with non-cardiac etiology given reassuring echocardiogram. Stress test was equivocal for ischemia but given improvement of chest pain shared position was to proceed with watchful waiting. Could consider coronary CTA if her symptoms worsen or change in severity or  characteristics.   Left carotid stenosis Stable Follow-up study scheduled for October 2023 Continue aspirin and statin therapy Patient is tolerating Crestor 40 mg nightly without issue   Mixed hyperlipidemia Currently on Crestor.   She denies myalgia or other side effects.  Essential hypertension Blood pressure within acceptable range. Medications reconciled. Reemphasized importance of low-salt diet. Continue to monitor.  FINAL MEDICATION LIST END OF ENCOUNTER: No orders of the defined types were placed in this encounter.    Current Outpatient Medications:    acetaminophen (TYLENOL) 325 MG tablet, Take 650 mg by mouth every 6 (six) hours as needed for mild pain or headache., Disp: , Rfl:    amoxicillin (AMOXIL) 500 MG tablet, Take 2,000 mg by mouth See admin instructions. Before dental procedures  (Patient not taking: Reported on 08/23/2021), Disp: , Rfl:    aspirin EC 81 MG tablet, Take 81 mg by mouth at bedtime. , Disp: , Rfl:    Cholecalciferol (VITAMIN D3) 5000 units TABS, Take 5,000 Units by mouth at bedtime. , Disp: , Rfl:    Cyanocobalamin (VITAMIN B-12) 5000 MCG TBDP, Take 5,000 mcg by mouth at bedtime. , Disp: , Rfl:    diclofenac (VOLTAREN) 75 MG EC tablet, Take by mouth., Disp: , Rfl:    FLUoxetine (PROZAC) 20 MG capsule, Take 20 mg by mouth at bedtime. , Disp: , Rfl:    losartan (COZAAR) 50 MG tablet, TAKE 1 TABLET BY MOUTH EVERY DAY, Disp: 90 tablet, Rfl: 3   rosuvastatin (CRESTOR) 40 MG tablet, Take 1 tablet (40 mg total) by mouth at bedtime. Increased by Dr. Terri Skains, per patient., Disp: 90 tablet, Rfl: 3   traMADol (ULTRAM) 50 MG tablet, take 1-2  tablet by oral route  every 8 hours as needed, Disp: , Rfl:   No orders of the defined types were placed in this encounter.  --Continue cardiac medications as reconciled in final medication list. --No follow-ups on file. Or sooner if needed. --Continue follow-up with your primary care physician regarding the management of  your other  chronic comorbid conditions.  Patient's questions and concerns were addressed to her satisfaction. She voices understanding of the instructions provided during this encounter.   This note was created using a voice recognition software as a result there may be grammatical errors inadvertently enclosed that do not reflect the nature of this encounter. Every attempt is made to correct such errors.   Alethia Berthold, PA-C 03/08/2022, 1:21 PM Office: [phone]

## 2022-03-09 ENCOUNTER — Ambulatory Visit: Payer: Medicare HMO | Admitting: Student

## 2022-03-09 DIAGNOSIS — I10 Essential (primary) hypertension: Secondary | ICD-10-CM

## 2022-03-09 DIAGNOSIS — E782 Mixed hyperlipidemia: Secondary | ICD-10-CM

## 2022-03-09 DIAGNOSIS — I6522 Occlusion and stenosis of left carotid artery: Secondary | ICD-10-CM

## 2022-03-14 ENCOUNTER — Other Ambulatory Visit: Payer: Self-pay | Admitting: Internal Medicine

## 2022-03-14 DIAGNOSIS — Z1231 Encounter for screening mammogram for malignant neoplasm of breast: Secondary | ICD-10-CM

## 2022-03-15 NOTE — Progress Notes (Unsigned)
Kimberly Lowery Date of Birth: 02-13-1946 MRN: [id number] Primary Care Provider:Gates, [name], MD Former Cardiology Providers: Jeri Lager, APRN, FNP-C  Primary Cardiologist: Alethia Berthold, PA-C, [facility] (established care 03/04/2020)  Date: 07/23/21 Last Office Visit: 10/28/2020   No chief complaint on file.   HPI  Kimberly Lowery is a 76 y.o.  female who presents to the office with a chief complaint of " follow up for carotid artery disease and chest pain evaluation." Patient's past medical history and cardiovascular risk factors include: left carotid artery disease, Hx of COVID 19 (Jan 2022), hypertension, hyperlipidemia, postmenopausal female, advanced age.   Patient previously presented with chest pain and underwent echocardiogram which revealed preserved LVEF without significant abnormalities, unchanged compared to previous and 03/2020.  She also had treadmill cardiac stress test showed reduced exercise capacity and was equivocal for ischemia. At last office visit patient's chest pain had significantly improved therefore no further testing was recommended. Also at last office visit patient's carotid artery stenosis was stable and recommended follow up study in 07/2022.   Patient now presents for 6 month follow up. ***  ***Recent lipids?   ALLERGIES: Allergies  Allergen Reactions   Shellfish Allergy Hives and Nausea And Vomiting    SHRIMP LOBSTER   Tape Other (See Comments)    causes blisters   Codeine Nausea And Vomiting   Dilaudid [Hydromorphone Hcl] Nausea And Vomiting   Florastor Kids [Saccharomyces Boulardii] Other (See Comments)    "SIGNIFICANT CONSTIPATION"   Garlic Other (See Comments)    Stomach Upset   Morphine Sulfate Nausea And Vomiting   Nucynta [Tapentadol] Other (See Comments)    hallucinations    Percocet [Oxycodone-Acetaminophen] Nausea And Vomiting   MEDICATION LIST PRIOR TO VISIT: Current Outpatient Medications on File Prior to Visit   Medication Sig Dispense Refill   acetaminophen (TYLENOL) 325 MG tablet Take 650 mg by mouth every 6 (six) hours as needed for mild pain or headache.     amoxicillin (AMOXIL) 500 MG tablet Take 2,000 mg by mouth See admin instructions. Before dental procedures  (Patient not taking: Reported on 08/23/2021)     aspirin EC 81 MG tablet Take 81 mg by mouth at bedtime.      Cholecalciferol (VITAMIN D3) 5000 units TABS Take 5,000 Units by mouth at bedtime.      Cyanocobalamin (VITAMIN B-12) 5000 MCG TBDP Take 5,000 mcg by mouth at bedtime.      diclofenac (VOLTAREN) 75 MG EC tablet Take by mouth.     FLUoxetine (PROZAC) 20 MG capsule Take 20 mg by mouth at bedtime.      losartan (COZAAR) 50 MG tablet TAKE 1 TABLET BY MOUTH EVERY DAY 90 tablet 3   rosuvastatin (CRESTOR) 40 MG tablet Take 1 tablet (40 mg total) by mouth at bedtime. Increased by Dr. Terri Skains, per patient. 90 tablet 3   traMADol (ULTRAM) 50 MG tablet take 1-2  tablet by oral route  every 8 hours as needed     No current facility-administered medications on file prior to visit.   PAST MEDICAL HISTORY: Past Medical History:  Diagnosis Date   Anxiety    Arthritis    "some in my back; was in my hips" (03/22/2016)   Asthma    Chronic lower back pain    Depression    Difficult intubation    Family history of anesthesia complication    "my aunt didn't go out during OR"   Fibrocystic breast disease    H/O  hiatal hernia    High cholesterol    HLD (hyperlipidemia) 05/08/2019   Hypertension    IBS (irritable bowel syndrome)    Obesity    PONV (postoperative nausea and vomiting)    bad after last hip replacement   Sleep apnea    "never RX mask" (03/22/2016)   PAST SURGICAL HISTORY: Past Surgical History:  Procedure Laterality Date   BACK SURGERY     BILATERAL TEMPOROMANDIBULAR JOINT ARTHROPLASTY Bilateral 2002   BREAST CYST ASPIRATION Left 1980's   COLONOSCOPY W/ POLYPECTOMY  ~ 2007   DEBRIDEMENT TENNIS ELBOW Right 1990   DILATION  AND CURETTAGE OF UTERUS  1980's X 2   EYE SURGERY Bilateral 1974   "Pinguecula"   FINGER SURGERY Right    index cyst removed   JOINT REPLACEMENT     KNEE ARTHROSCOPY WITH SUBCHONDROPLASTY Left 05/08/2018   Procedure: KNEE ARTHROSCOPY WITH SUBCHONDROPLASTY;  Surgeon: Melrose Nakayama, MD;  Location: [place];  Service: Orthopedics;  Laterality: Left;   [place] SURGERY  01/2009   LUMBAR FUSION  08/2009   LUMBAR LAMINECTOMY  1981   MASS EXCISION Left 04/10/2017   Procedure: EXCISION OF LEFT INDEX FINGER CYST;  Surgeon: Milly Jakob, MD;  Location: [place];  Service: Orthopedics;  Laterality: Left;   [place]   TOTAL HIP ARTHROPLASTY Right 03/18/2014   Procedure: RIGHT TOTAL HIP ARTHROPLASTY ANTERIOR APPROACH;  Surgeon: Hessie Dibble, MD;  Location: [place];  Service: Orthopedics;  Laterality: Right;   TOTAL HIP ARTHROPLASTY Left 03/22/2016   TOTAL HIP ARTHROPLASTY Left 03/22/2016   Procedure: TOTAL HIP ARTHROPLASTY ANTERIOR APPROACH;  Surgeon: Melrose Nakayama, MD;  Location: [place];  Service: Orthopedics;  Laterality: Left;   TOTAL KNEE ARTHROPLASTY Left 11/13/2018   Procedure: TOTAL KNEE ARTHROPLASTY;  Surgeon: Melrose Nakayama, MD;  Location: [place];  Service: Orthopedics;  Laterality: Left;   TUBAL LIGATION  1981   FAMILY HISTORY: The patient's family history includes Colon cancer in her father; Heart disease in her mother.   SOCIAL HISTORY:  The patient  reports that she has never smoked. She has never used smokeless tobacco. She reports current alcohol use. She reports that she does not use drugs.  Review of Systems  Constitutional: Negative for chills and fever.  HENT:  Negative for hoarse voice and nosebleeds.   Eyes:  Negative for discharge, double vision and pain.  Cardiovascular:  Negative for chest pain (improved significantly), claudication, dyspnea on exertion, leg swelling,  near-syncope, orthopnea, palpitations, paroxysmal nocturnal dyspnea and syncope.  Respiratory:  Negative for hemoptysis and shortness of breath.   Musculoskeletal:  Negative for muscle cramps and myalgias.  Gastrointestinal:  Negative for abdominal pain, constipation, diarrhea, hematemesis, hematochezia, melena, nausea and vomiting.  Neurological:  Negative for dizziness and light-headedness.    PHYSICAL EXAM:    08/23/2021    1:10 PM 07/23/2021   10:23 AM 10/28/2020    9:00 AM  Vitals with BMI  Height 5' 7"  5' 7"  5' 7"   Weight 176 lbs 3 oz 174 lbs 174 lbs  BMI 27.59 70.78 67.54  Systolic 492 010 071  Diastolic 80 72 71  Pulse 76 63 71   CONSTITUTIONAL: Well-developed and well-nourished. No acute distress.  SKIN: Skin is warm and dry. No rash noted. No cyanosis. No pallor. No jaundice HEAD: Normocephalic and atraumatic.  EYES: No scleral icterus MOUTH/THROAT: Moist oral membranes.  NECK: No  JVD present. No thyromegaly noted. Left carotid bruit.  LYMPHATIC: No visible cervical adenopathy.  CHEST Normal respiratory effort. No intercostal retractions  LUNGS: Clear to auscultation bilaterally.  No stridor. No wheezes. No rales.  CARDIOVASCULAR: Regular rate and rhythm, positive S1-S2, no murmurs rubs or gallops appreciated. ABDOMINAL: Soft, nontender, nondistended, positive bowel sounds in all 4 quadrants, binder/brace in place.  No apparent ascites.  EXTREMITIES: No peripheral edema  HEMATOLOGIC: No significant bruising NEUROLOGIC: Oriented to person, place, and time. Nonfocal. Normal muscle tone.  PSYCHIATRIC: Normal mood and affect. Normal behavior. Cooperative ***Physical exam is unchanged compared to pervious office visit.   CARDIAC DATABASE: EKG: 07/23/2021: Normal sinus rhythm, 61 bpm, normal axis, without underlying ischemia or injury pattern. ** *  Echocardiogram: 03/05/2020: Normal LV systolic function with visual EF 60-65%. Left ventricle cavity is normal in size.  Normal global wall motion. Normal diastolic filling pattern, normal LAP. Calculated EF 63%. Mild (Grade I) mitral regurgitation. Mild tricuspid regurgitation. Mild pulmonic regurgitation. No prior study for comparison.  08/09/2021:  Normal LV systolic function with visual EF 60-65%. Left ventricle cavity is normal in size. Normal left ventricular wall thickness. Normal global wall motion. Normal diastolic filling pattern, normal LAP.  Mild tricuspid regurgitation. No evidence of pulmonary hypertension.  Mild pulmonic regurgitation.  Compared to prior study 03/05/2020 no significant change, except MR is now trivial.  Stress Testing:  Lexiscan/modified Bruce Tetrofosmin stress test 04/13/2020: Normal myocardial perfusion. Stress LVEF calculated 44%, although visually appears normal. Low risk study.  Exercise treadmill stress test 08/09/2021: Exercise treadmill stress test performed using Bruce protocol.  Patient reached 6.5 METS, and 99% of age predicted maximum heart rate.  Exercise capacity was low.  Non-limiting chest pain, dyspnea, and dizziness reported. Normal heart rate and hemodynamic response.  Stress EKG showed sinus tachycardia, 1-1/5 mm upsloping ST depressions in leads II, III, aVF, returning to baseline within 1 min into recovery. The changes are at equivocal for ischemia.  Given low exercise capacity and equivocal EKG changes, recommend clinical correlation.   Heart Catheterization: None  Carotid artery duplex 07/14/2021:  Duplex suggests stenosis in the right internal carotid artery (1-15%). Duplex suggests stenosis in the left internal carotid artery (50-69%). Antegrade right vertebral artery flow. Antegrade left vertebral artery flow. No significant change since prior studies dating back to 03/09/2020. Follow up in six months is appropriate if clinically indicated.  LABORATORY DATA:    Latest Ref Rng & Units 04/10/2020    3:04 PM 06/12/2019    9:37 AM 11/02/2018    1:36 PM   CMP  Glucose 70 - 99 mg/dL 99  103  110   BUN 8 - 23 mg/dL 15  16  12    Creatinine 0.44 - 1.00 mg/dL 0.89  0.80  0.84   Sodium 135 - 145 mmol/L 141  143  142   Potassium 3.5 - 5.1 mmol/L 3.9  5.1  4.2   Chloride 98 - 111 mmol/L 105  105  106   CO2 22 - 32 mmol/L 27  26  27    Calcium 8.9 - 10.3 mg/dL 9.0  9.0  9.3       Latest Ref Rng & Units 04/10/2020    3:04 PM 11/02/2018    1:36 PM 04/30/2018   11:02 AM  CBC  WBC 4.0 - 10.5 K/uL 7.5  7.9  5.5   Hemoglobin 12.0 - 15.0 g/dL 12.9  13.4  13.1   Hematocrit 36.0 - 46.0 % 39.8  42.7  42.2   Platelets 150 - 400 K/uL 251  278  257    Lipid Panel  No results found for: "CHOL", "TRIG", "HDL", "CHOLHDL", "VLDL", "LDLCALC", "LDLDIRECT" HEMOGLOBIN A1C No results found for: "HGBA1C", "MPG" TSH No results for input(s): "TSH" in the last 8760 hours.  External Labs: 02/20/2019: CBC normal, total cholesterol 181, triglycerides 180, HDL 58, LDL 87, non-HDL cholesterol 123.  Serum glucose 101 mg, BUN 19, creatinine 0.73, EGFR 79 mL, potassium 5.1.  CMP otherwise normal.  Collected: January 23, 2020 Creatinine 0.73 mg/dL. Lipid profile: Total cholesterol 170 triglycerides 154, HDL 63, LDL 80, TSH: 2.0  Cardiac Panel (last 3 results) No results for input(s): "CKTOTAL", "CKMB", "TROPONINIHS", "RELINDX" in the last 72 hours.  IMPRESSION:  No diagnosis found.    RECOMMENDATIONS: Kimberly Lowery is a 76 y.o. female whose past medical history and cardiovascular risk factors include: left carotid artery disease, hypertension, hyperlipidemia, postmenopausal female, advanced age.  *** Precordial pain Atypical and improved since last office visit. Symptoms appear consistent with non-cardiac etiology given reassuring echocardiogram. Stress test was equivocal for ischemia but given improvement of chest pain shared position was to proceed with watchful waiting. Could consider coronary CTA if her symptoms worsen or change in severity or  characteristics.   Left carotid stenosis Stable Follow-up study scheduled for October 2023 Continue aspirin and statin therapy Patient is tolerating Crestor 40 mg nightly without issue   Mixed hyperlipidemia Currently on Crestor.   She denies myalgia or other side effects.  Essential hypertension Blood pressure within acceptable range. Medications reconciled. Reemphasized importance of low-salt diet. Continue to monitor.  FINAL MEDICATION LIST END OF ENCOUNTER: No orders of the defined types were placed in this encounter.    Current Outpatient Medications:    acetaminophen (TYLENOL) 325 MG tablet, Take 650 mg by mouth every 6 (six) hours as needed for mild pain or headache., Disp: , Rfl:    amoxicillin (AMOXIL) 500 MG tablet, Take 2,000 mg by mouth See admin instructions. Before dental procedures  (Patient not taking: Reported on 08/23/2021), Disp: , Rfl:    aspirin EC 81 MG tablet, Take 81 mg by mouth at bedtime. , Disp: , Rfl:    Cholecalciferol (VITAMIN D3) 5000 units TABS, Take 5,000 Units by mouth at bedtime. , Disp: , Rfl:    Cyanocobalamin (VITAMIN B-12) 5000 MCG TBDP, Take 5,000 mcg by mouth at bedtime. , Disp: , Rfl:    diclofenac (VOLTAREN) 75 MG EC tablet, Take by mouth., Disp: , Rfl:    FLUoxetine (PROZAC) 20 MG capsule, Take 20 mg by mouth at bedtime. , Disp: , Rfl:    losartan (COZAAR) 50 MG tablet, TAKE 1 TABLET BY MOUTH EVERY DAY, Disp: 90 tablet, Rfl: 3   rosuvastatin (CRESTOR) 40 MG tablet, Take 1 tablet (40 mg total) by mouth at bedtime. Increased by Dr. Terri Skains, per patient., Disp: 90 tablet, Rfl: 3   traMADol (ULTRAM) 50 MG tablet, take 1-2  tablet by oral route  every 8 hours as needed, Disp: , Rfl:   No orders of the defined types were placed in this encounter.  --Continue cardiac medications as reconciled in final medication list. --No follow-ups on file. Or sooner if needed. --Continue follow-up with your primary care physician regarding the management of  your other chronic comorbid conditions.  Patient's questions and concerns were addressed to her satisfaction. She voices understanding of the instructions provided during this encounter.   This note was created using a voice recognition software  as a result there may be grammatical errors inadvertently enclosed that do not reflect the nature of this encounter. Every attempt is made to correct such errors.   Alethia Berthold, PA-C 03/15/2022, 9:13 AM Office: [phone]

## 2022-03-16 ENCOUNTER — Ambulatory Visit: Payer: Medicare HMO | Admitting: Student

## 2022-03-16 ENCOUNTER — Ambulatory Visit
Admission: RE | Admit: 2022-03-16 | Discharge: 2022-03-16 | Disposition: A | Discharge status: Home / Self Care | Payer: Medicare HMO | Source: Ambulatory Visit

## 2022-03-16 ENCOUNTER — Encounter: Payer: Self-pay | Admitting: Student

## 2022-03-16 VITALS — BP 111/71 | HR 78 | Temp 98.0°F | Resp 17 | Ht 67.0 in | Wt 174.6 lb

## 2022-03-16 DIAGNOSIS — Z1231 Encounter for screening mammogram for malignant neoplasm of breast: Secondary | ICD-10-CM | POA: Diagnosis not present

## 2022-03-16 DIAGNOSIS — I10 Essential (primary) hypertension: Secondary | ICD-10-CM

## 2022-03-16 DIAGNOSIS — E782 Mixed hyperlipidemia: Secondary | ICD-10-CM | POA: Diagnosis not present

## 2022-03-16 DIAGNOSIS — I6522 Occlusion and stenosis of left carotid artery: Secondary | ICD-10-CM | POA: Diagnosis not present

## 2022-03-24 DIAGNOSIS — Z8249 Family history of ischemic heart disease and other diseases of the circulatory system: Secondary | ICD-10-CM | POA: Diagnosis not present

## 2022-03-24 DIAGNOSIS — I10 Essential (primary) hypertension: Secondary | ICD-10-CM | POA: Diagnosis not present

## 2022-03-24 DIAGNOSIS — M1990 Unspecified osteoarthritis, unspecified site: Secondary | ICD-10-CM | POA: Diagnosis not present

## 2022-03-24 DIAGNOSIS — Z885 Allergy status to narcotic agent status: Secondary | ICD-10-CM | POA: Diagnosis not present

## 2022-03-24 DIAGNOSIS — R32 Unspecified urinary incontinence: Secondary | ICD-10-CM | POA: Diagnosis not present

## 2022-03-24 DIAGNOSIS — Z7982 Long term (current) use of aspirin: Secondary | ICD-10-CM | POA: Diagnosis not present

## 2022-03-24 DIAGNOSIS — R69 Illness, unspecified: Secondary | ICD-10-CM | POA: Diagnosis not present

## 2022-03-24 DIAGNOSIS — Z9181 History of falling: Secondary | ICD-10-CM | POA: Diagnosis not present

## 2022-03-24 DIAGNOSIS — Z91013 Allergy to seafood: Secondary | ICD-10-CM | POA: Diagnosis not present

## 2022-03-24 DIAGNOSIS — Z791 Long term (current) use of non-steroidal anti-inflammatories (NSAID): Secondary | ICD-10-CM | POA: Diagnosis not present

## 2022-03-24 DIAGNOSIS — E785 Hyperlipidemia, unspecified: Secondary | ICD-10-CM | POA: Diagnosis not present

## 2022-03-24 DIAGNOSIS — Z008 Encounter for other general examination: Secondary | ICD-10-CM | POA: Diagnosis not present

## 2022-03-24 DIAGNOSIS — Z809 Family history of malignant neoplasm, unspecified: Secondary | ICD-10-CM | POA: Diagnosis not present

## 2022-05-18 DIAGNOSIS — R197 Diarrhea, unspecified: Secondary | ICD-10-CM | POA: Diagnosis not present

## 2022-05-18 DIAGNOSIS — Z800 Family history of malignant neoplasm of digestive organs: Secondary | ICD-10-CM | POA: Diagnosis not present

## 2022-05-18 DIAGNOSIS — Z86010 Personal history of colonic polyps: Secondary | ICD-10-CM | POA: Diagnosis not present

## 2022-05-20 DIAGNOSIS — Z96642 Presence of left artificial hip joint: Secondary | ICD-10-CM | POA: Diagnosis not present

## 2022-05-20 DIAGNOSIS — M25552 Pain in left hip: Secondary | ICD-10-CM | POA: Diagnosis not present

## 2022-05-24 ENCOUNTER — Other Ambulatory Visit: Payer: Self-pay | Admitting: Internal Medicine

## 2022-05-24 DIAGNOSIS — Z86010 Personal history of colonic polyps: Secondary | ICD-10-CM | POA: Diagnosis not present

## 2022-05-24 DIAGNOSIS — M545 Low back pain, unspecified: Secondary | ICD-10-CM | POA: Diagnosis not present

## 2022-05-24 DIAGNOSIS — H6123 Impacted cerumen, bilateral: Secondary | ICD-10-CM | POA: Diagnosis not present

## 2022-05-24 DIAGNOSIS — R69 Illness, unspecified: Secondary | ICD-10-CM | POA: Diagnosis not present

## 2022-05-24 DIAGNOSIS — M1990 Unspecified osteoarthritis, unspecified site: Secondary | ICD-10-CM | POA: Diagnosis not present

## 2022-05-24 DIAGNOSIS — E785 Hyperlipidemia, unspecified: Secondary | ICD-10-CM | POA: Diagnosis not present

## 2022-05-24 DIAGNOSIS — E538 Deficiency of other specified B group vitamins: Secondary | ICD-10-CM | POA: Diagnosis not present

## 2022-05-24 DIAGNOSIS — Z0000 Encounter for general adult medical examination without abnormal findings: Secondary | ICD-10-CM | POA: Diagnosis not present

## 2022-05-24 DIAGNOSIS — F322 Major depressive disorder, single episode, severe without psychotic features: Secondary | ICD-10-CM | POA: Diagnosis not present

## 2022-05-24 DIAGNOSIS — K219 Gastro-esophageal reflux disease without esophagitis: Secondary | ICD-10-CM | POA: Diagnosis not present

## 2022-05-24 DIAGNOSIS — Z13820 Encounter for screening for osteoporosis: Secondary | ICD-10-CM

## 2022-05-24 DIAGNOSIS — I6522 Occlusion and stenosis of left carotid artery: Secondary | ICD-10-CM | POA: Diagnosis not present

## 2022-05-24 DIAGNOSIS — E559 Vitamin D deficiency, unspecified: Secondary | ICD-10-CM | POA: Diagnosis not present

## 2022-05-24 DIAGNOSIS — R61 Generalized hyperhidrosis: Secondary | ICD-10-CM | POA: Diagnosis not present

## 2022-05-24 DIAGNOSIS — Z79899 Other long term (current) drug therapy: Secondary | ICD-10-CM | POA: Diagnosis not present

## 2022-06-15 ENCOUNTER — Ambulatory Visit
Admission: RE | Admit: 2022-06-15 | Discharge: 2022-06-15 | Disposition: A | Discharge status: Home / Self Care | Payer: Medicare HMO | Source: Ambulatory Visit | Attending: Internal Medicine | Admitting: Internal Medicine

## 2022-06-15 ENCOUNTER — Other Ambulatory Visit: Payer: Self-pay | Admitting: Internal Medicine

## 2022-06-15 DIAGNOSIS — M542 Cervicalgia: Secondary | ICD-10-CM | POA: Diagnosis not present

## 2022-06-15 DIAGNOSIS — R202 Paresthesia of skin: Secondary | ICD-10-CM | POA: Diagnosis not present

## 2022-06-15 DIAGNOSIS — G629 Polyneuropathy, unspecified: Secondary | ICD-10-CM | POA: Diagnosis not present

## 2022-06-17 DIAGNOSIS — M79672 Pain in left foot: Secondary | ICD-10-CM | POA: Diagnosis not present

## 2022-06-21 ENCOUNTER — Ambulatory Visit
Admission: RE | Admit: 2022-06-21 | Discharge: 2022-06-21 | Disposition: A | Discharge status: Home / Self Care | Payer: Medicare HMO | Source: Ambulatory Visit | Attending: Internal Medicine | Admitting: Internal Medicine

## 2022-06-21 DIAGNOSIS — Z780 Asymptomatic menopausal state: Secondary | ICD-10-CM | POA: Diagnosis not present

## 2022-06-21 DIAGNOSIS — Z13820 Encounter for screening for osteoporosis: Secondary | ICD-10-CM

## 2022-06-24 DIAGNOSIS — D2271 Melanocytic nevi of right lower limb, including hip: Secondary | ICD-10-CM | POA: Diagnosis not present

## 2022-06-24 DIAGNOSIS — Z85828 Personal history of other malignant neoplasm of skin: Secondary | ICD-10-CM | POA: Diagnosis not present

## 2022-06-24 DIAGNOSIS — Z86018 Personal history of other benign neoplasm: Secondary | ICD-10-CM | POA: Diagnosis not present

## 2022-06-24 DIAGNOSIS — L719 Rosacea, unspecified: Secondary | ICD-10-CM | POA: Diagnosis not present

## 2022-06-24 DIAGNOSIS — D2272 Melanocytic nevi of left lower limb, including hip: Secondary | ICD-10-CM | POA: Diagnosis not present

## 2022-06-24 DIAGNOSIS — Z808 Family history of malignant neoplasm of other organs or systems: Secondary | ICD-10-CM | POA: Diagnosis not present

## 2022-06-24 DIAGNOSIS — D225 Melanocytic nevi of trunk: Secondary | ICD-10-CM | POA: Diagnosis not present

## 2022-06-24 DIAGNOSIS — L578 Other skin changes due to chronic exposure to nonionizing radiation: Secondary | ICD-10-CM | POA: Diagnosis not present

## 2022-06-24 DIAGNOSIS — L408 Other psoriasis: Secondary | ICD-10-CM | POA: Diagnosis not present

## 2022-07-16 IMAGING — RF DG C-ARM 1-60 MIN
1 series · 2 of 2 positions shown · non-contrast
Comparison: Lumbar spine radiographs 01/23/2020.  MRI 01/23/2020.

CLINICAL DATA: L2-3 PLIF.

EXAM:
LUMBAR SPINE - 2-3 VIEW; DG C-ARM 1-60 MIN

[Series 1: run · 2 of 2 slices shown]
[im 1/2]
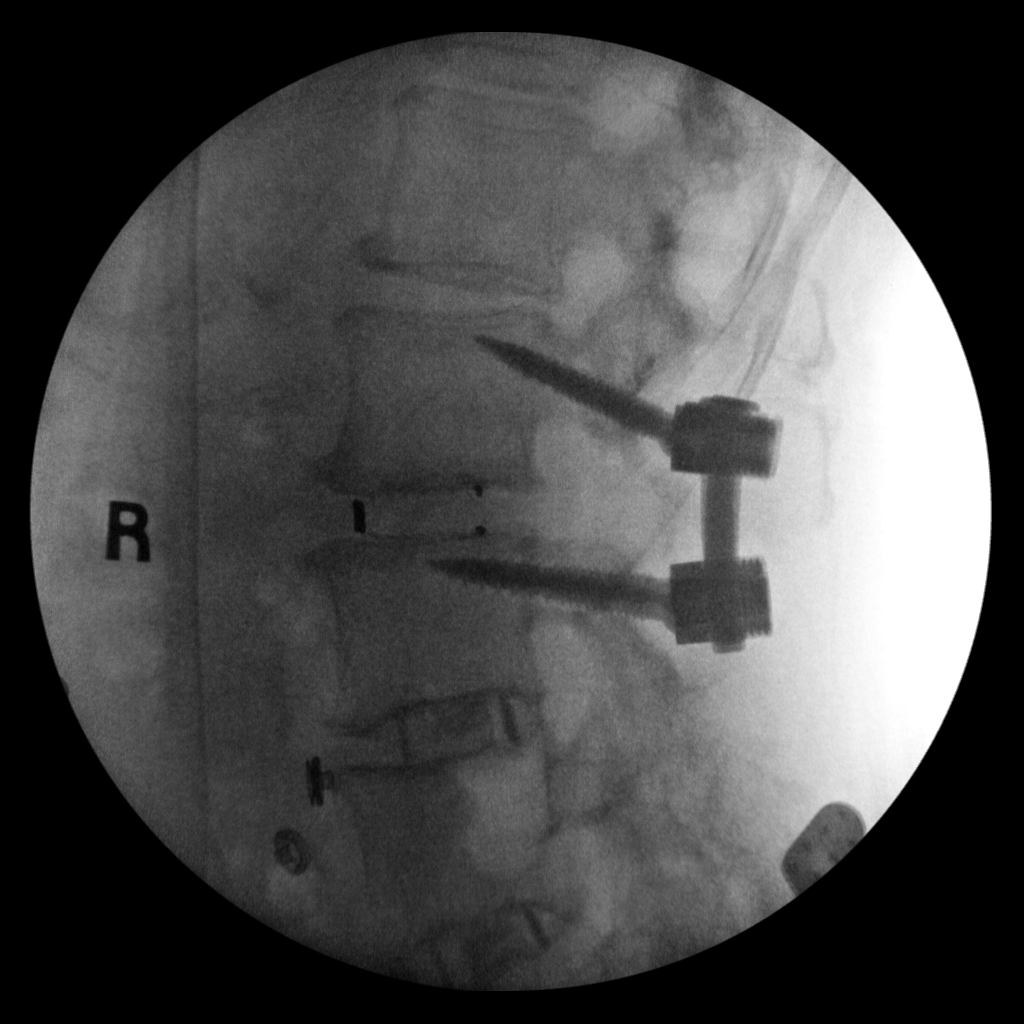
[im 2/2]
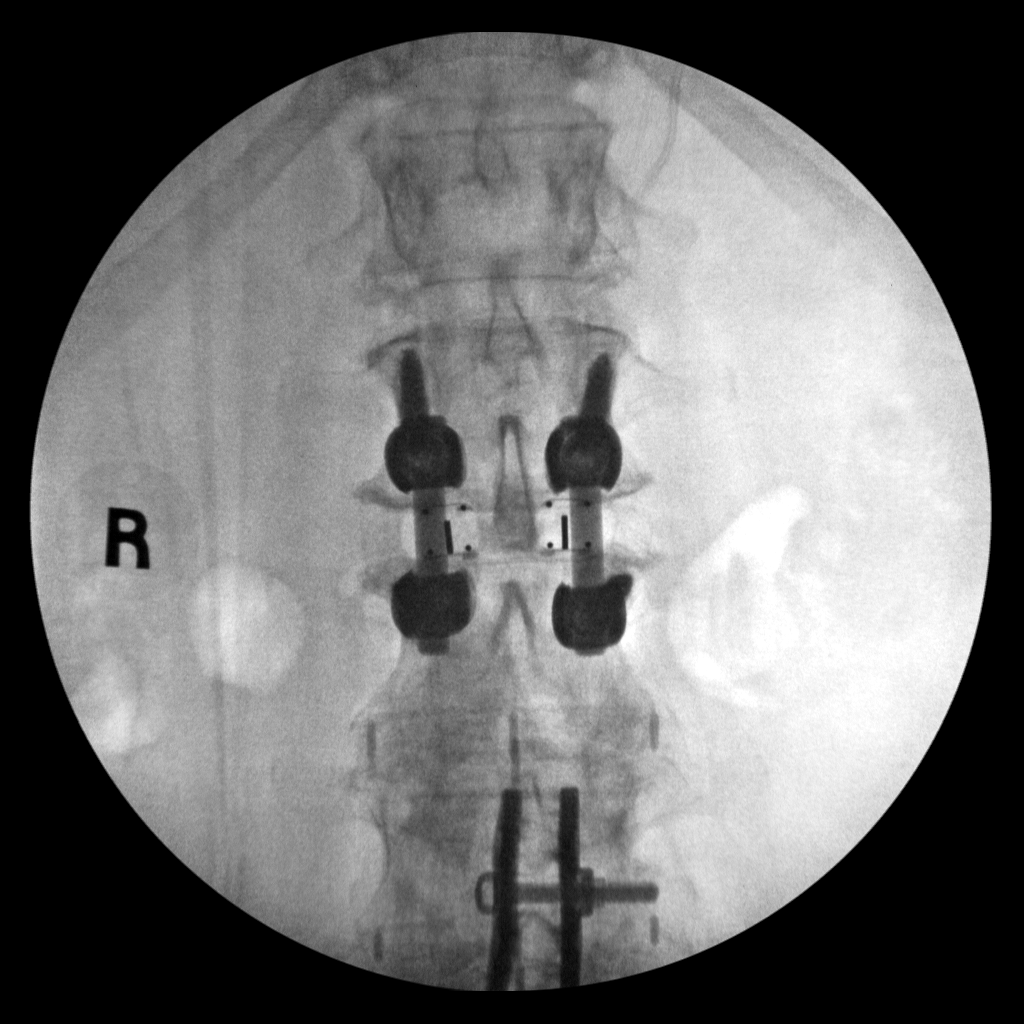

[2 of 2 positions shown; findings below may reference images not displayed]

FINDINGS: C-arm fluoroscopy was provided in the operating [HOSPITAL] seconds of
fluoroscopy time. 28.87 mGy.

PA and lateral spot fluoroscopic images of the lower lumbar spine
are submitted. Based on previous interbody fusion at L3-4 and L4-5,
these images demonstrate interval posterior lumbar and interbody
fusion at L2-3. The new hardware appears well positioned.
Interspinous distractors have been removed at L3-4. Distracted
remains at L4-5. No demonstrated complications.
IMPRESSION: Intraoperative views during L2-3 posterior lumbar and interbody
fusion. No demonstrated complication.

## 2022-07-18 DIAGNOSIS — M79672 Pain in left foot: Secondary | ICD-10-CM | POA: Diagnosis not present

## 2022-07-20 ENCOUNTER — Ambulatory Visit: Payer: Medicare HMO

## 2022-07-20 DIAGNOSIS — I6522 Occlusion and stenosis of left carotid artery: Secondary | ICD-10-CM | POA: Diagnosis not present

## 2022-07-25 DIAGNOSIS — G5603 Carpal tunnel syndrome, bilateral upper limbs: Secondary | ICD-10-CM | POA: Diagnosis not present

## 2022-07-26 NOTE — Progress Notes (Signed)
Called patient to inform her about her lab duplex results. Patient understood.

## 2022-07-28 DIAGNOSIS — G5603 Carpal tunnel syndrome, bilateral upper limbs: Secondary | ICD-10-CM | POA: Diagnosis not present

## 2022-08-02 ENCOUNTER — Encounter: Payer: Self-pay | Admitting: Cardiology

## 2022-08-02 ENCOUNTER — Ambulatory Visit: Payer: Medicare HMO | Admitting: Cardiology

## 2022-08-02 VITALS — BP 109/68 | HR 71 | Temp 97.7°F | Resp 16 | Ht 67.0 in | Wt 164.8 lb

## 2022-08-02 DIAGNOSIS — E782 Mixed hyperlipidemia: Secondary | ICD-10-CM

## 2022-08-02 DIAGNOSIS — I10 Essential (primary) hypertension: Secondary | ICD-10-CM

## 2022-08-02 DIAGNOSIS — I6522 Occlusion and stenosis of left carotid artery: Secondary | ICD-10-CM

## 2022-08-02 NOTE — Progress Notes (Addendum)
Kimberly Lowery Date of Birth: 11-24-1945 MRN: [id number] Primary Care Provider:Gates, [name], MD Former Cardiology Providers: Jeri Lager, APRN, FNP-C  Primary Cardiologist: Rex Kras, DO, [facility] (established care 03/04/2020)  Date: 07/23/21 Last Office Visit: 10/28/2020  Chief Complaint  Patient presents with   carotid duplex   Hypertension   Follow-up   HPI  Kimberly Lowery is a 76 y.o.  female with past medical history and cardiovascular risk factors include: left carotid artery disease, Hx of COVID 19 (Jan 2022), hypertension, hyperlipidemia, postmenopausal female, advanced age.  Patient previously presented with chest pain and underwent echocardiogram which revealed preserved LVEF without significant abnormalities, unchanged compared to previous and 03/2020.  She also had treadmill cardiac stress test showed reduced exercise capacity and was equivocal for ischemia. At last office visit patient's chest pain had significantly improved therefore no further testing was recommended.   She now presents for 5 month follow up.  She is feeling well overall.  She has lost 10 pounds since last visit with dietary changes. She recently moved in [facility] which provide meals for her and her husband. She is not currently exercising due to stress fracture in right foot. She denies chest pain, shortness of breath, palpitations, leg edema, orthopnea, PND, TIA/syncope.  ALLERGIES: Allergies  Allergen Reactions   Shellfish Allergy Hives and Nausea And Vomiting    SHRIMP LOBSTER   Tape Other (See Comments)    causes blisters   Codeine Nausea And Vomiting   Dilaudid [Hydromorphone Hcl] Nausea And Vomiting   Florastor Kids [Saccharomyces Boulardii] Other (See Comments)    "SIGNIFICANT CONSTIPATION"   Garlic Other (See Comments)    Stomach Upset   Morphine Sulfate Nausea And Vomiting   Nucynta [Tapentadol] Other (See Comments)    hallucinations    Percocet [Oxycodone-Acetaminophen]  Nausea And Vomiting   MEDICATION LIST PRIOR TO VISIT: Current Outpatient Medications on File Prior to Visit  Medication Sig Dispense Refill   acetaminophen (TYLENOL) 325 MG tablet Take 650 mg by mouth every 6 (six) hours as needed for mild pain or headache.     amoxicillin (AMOXIL) 500 MG tablet Take 2,000 mg by mouth See admin instructions. Before dental procedures     aspirin EC 81 MG tablet Take 81 mg by mouth at bedtime.      Cholecalciferol (VITAMIN D3) 5000 units TABS Take 5,000 Units by mouth at bedtime.      Cyanocobalamin (VITAMIN B-12) 5000 MCG TBDP Take 5,000 mcg by mouth at bedtime.      diclofenac (VOLTAREN) 50 MG EC tablet Take 50 mg by mouth 2 (two) times daily as needed.     FLUoxetine (PROZAC) 20 MG capsule Take 20 mg by mouth at bedtime.      losartan (COZAAR) 50 MG tablet TAKE 1 TABLET BY MOUTH EVERY DAY 90 tablet 3   rosuvastatin (CRESTOR) 40 MG tablet Take 1 tablet (40 mg total) by mouth at bedtime. Increased by Dr. Terri Skains, per patient. 90 tablet 3   traMADol (ULTRAM) 50 MG tablet take 1-2  tablet by oral route  every 8 hours as needed     No current facility-administered medications on file prior to visit.   PAST MEDICAL HISTORY: Past Medical History:  Diagnosis Date   Anxiety    Arthritis    "some in my back; was in my hips" (03/22/2016)   Asthma    Chronic lower back pain    Depression    Difficult intubation    Family history  of anesthesia complication    "my aunt didn't go out during OR"   Fibrocystic breast disease    H/O hiatal hernia    High cholesterol    HLD (hyperlipidemia) 05/08/2019   Hypertension    IBS (irritable bowel syndrome)    Obesity    PONV (postoperative nausea and vomiting)    bad after last hip replacement   Sleep apnea    "never RX mask" (03/22/2016)   FAMILY HISTORY: The patient's family history includes Colon cancer in her father; Heart disease in her mother.   SOCIAL HISTORY:  The patient  reports that she has never smoked.  She has never used smokeless tobacco. She reports current alcohol use. She reports that she does not use drugs.  Review of Systems  Eyes:  Negative for discharge, double vision and pain.  Cardiovascular:  Negative for chest pain, claudication, dyspnea on exertion, leg swelling, near-syncope, orthopnea, palpitations, paroxysmal nocturnal dyspnea and syncope.  Respiratory:  Negative for hemoptysis and shortness of breath.   Musculoskeletal:  Negative for muscle cramps and myalgias.  Gastrointestinal:  Negative for abdominal pain, constipation, diarrhea, hematemesis, hematochezia, melena, nausea and vomiting.  Neurological:  Negative for dizziness and light-headedness.    PHYSICAL EXAM:    08/02/2022   10:29 AM 03/16/2022    1:35 PM 08/23/2021    1:10 PM  Vitals with BMI  Height _0  _1  _2   Weight 164 lbs 13 oz 174 lbs 10 oz 176 lbs 3 oz  BMI 25.81 41.74 08.14  Systolic 481 856 314  Diastolic 68 71 80  Pulse 71 78 76   CONSTITUTIONAL: Well-developed and well-nourished. No acute distress.  SKIN: Skin is warm and dry. No rash noted. No cyanosis. No pallor. No jaundice HEAD: Normocephalic and atraumatic.  EYES: No scleral icterus MOUTH/THROAT: Moist oral membranes.  NECK: No JVD present. No thyromegaly noted. Left carotid bruit.  LYMPHATIC: No visible cervical adenopathy.  CHEST Normal respiratory effort. No intercostal retractions  LUNGS: Clear to auscultation bilaterally.  No stridor. No wheezes. No rales.  CARDIOVASCULAR: Regular rate and rhythm, positive S1-S2, no murmurs rubs or gallops appreciated. ABDOMINAL: Soft, nontender, nondistended, positive bowel sounds in all 4 quadrants, binder/brace in place.  No apparent ascites.  EXTREMITIES: No peripheral edema  HEMATOLOGIC: No significant bruising NEUROLOGIC: Oriented to person, place, and time. Nonfocal. Normal muscle tone.  PSYCHIATRIC: Normal mood and affect. Normal behavior. Cooperative Physical exam unchanged compared  to previous office visit.  CARDIAC DATABASE: EKG: 03/16/2022: Sinus rhythm at a rate of 67 bpm.  Normal axis.  No evidence of ischemia or underlying injury pattern.  Compared EKG 07/23/2021, no significant change.  Echocardiogram: 03/05/2020: Normal LV systolic function with visual EF 60-65%. Left ventricle cavity is normal in size. Normal global wall motion. Normal diastolic filling pattern, normal LAP. Calculated EF 63%. Mild (Grade I) mitral regurgitation. Mild tricuspid regurgitation. Mild pulmonic regurgitation. No prior study for comparison.  08/09/2021:  Normal LV systolic function with visual EF 60-65%. Left ventricle cavity is normal in size. Normal left ventricular wall thickness. Normal global wall motion. Normal diastolic filling pattern, normal LAP.  Mild tricuspid regurgitation. No evidence of pulmonary hypertension.  Mild pulmonic regurgitation.  Compared to prior study 03/05/2020 no significant change, except MR is now trivial.  Stress Testing:  Lexiscan/modified Bruce Tetrofosmin stress test 04/13/2020: Normal myocardial perfusion. Stress LVEF calculated 44%, although visually appears normal. Low risk study.  Exercise treadmill stress test 08/09/2021: Exercise treadmill stress test performed using  Bruce protocol.  Patient reached 6.5 METS, and 99% of age predicted maximum heart rate.  Exercise capacity was low.  Non-limiting chest pain, dyspnea, and dizziness reported. Normal heart rate and hemodynamic response.  Stress EKG showed sinus tachycardia, 1-1/5 mm upsloping ST depressions in leads II, III, aVF, returning to baseline within 1 min into recovery. The changes are at equivocal for ischemia.  Given low exercise capacity and equivocal EKG changes, recommend clinical correlation.   Heart Catheterization: None  Carotid artery duplex 07/20/2022:  Duplex suggests stenosis in the right internal carotid artery (1-15%).  Right ICA is tortuous and may be the source of bruit   Duplex suggests stenosis in the left internal carotid artery (1-15%).  Antegrade right vertebral artery flow. Antegrade left vertebral artery  flow.   LABORATORY DATA:    Latest Ref Rng & Units 04/10/2020    3:04 PM 06/12/2019    9:37 AM 11/02/2018    1:36 PM  CMP  Glucose 70 - 99 mg/dL 99  103  110   BUN 8 - 23 mg/dL _0 Creatinine 0.44 - 1.00 mg/dL 0.89  0.80  0.84   Sodium 135 - 145 mmol/L 141  143  142   Potassium 3.5 - 5.1 mmol/L 3.9  5.1  4.2   Chloride 98 - 111 mmol/L 105  105  106   CO2 22 - 32 mmol/L _1 Calcium 8.9 - 10.3 mg/dL 9.0  9.0  9.3       Latest Ref Rng & Units 04/10/2020    3:04 PM 11/02/2018    1:36 PM 04/30/2018   11:02 AM  CBC  WBC 4.0 - 10.5 K/uL 7.5  7.9  5.5   Hemoglobin 12.0 - 15.0 g/dL 12.9  13.4  13.1   Hematocrit 36.0 - 46.0 % 39.8  42.7  42.2   Platelets 150 - 400 K/uL 251  278  257    Lipid Panel  No results found for: "CHOL", "TRIG", "HDL", "CHOLHDL", "VLDL", "LDLCALC", "LDLDIRECT" HEMOGLOBIN A1C No results found for: "HGBA1C", "MPG" TSH No results for input(s): "TSH" in the last 8760 hours.  External Labs:  05/24/2022 Hemoglobin 13.3, hematocrit 40.7, MCV 90.4, platelets 260  Sodium 142, potassium 5.3, BUN 16, creatinine 0.95, EGFR 63  Cholesterol 188, triglycerides 113, HDL 72, LDL 90  TSH 1.87  Cardiac Panel (last 3 results) No results for input(s): "CKTOTAL", "CKMB", "TROPONINIHS", "RELINDX" in the last 72 hours.  IMPRESSION:    ICD-10-CM   1. Left carotid stenosis  I65.22 PCV CAROTID DUPLEX (BILATERAL)    2. Mixed hyperlipidemia  E78.2     3. Primary hypertension  I10       RECOMMENDATIONS: Kimberly Lowery is a 76 y.o. female whose past medical history and cardiovascular risk factors include: left carotid artery disease, hypertension, hyperlipidemia, postmenopausal female, advanced age.   Left carotid stenosis Reviewed results of recent carotid artery duplex that revealed improvement in left carotid  stenosis. Will repeat study in 1 year. Continue aspirin and statin therapy  Mixed hyperlipidemia Currently on Crestor.   She denies myalgia or other side effects. Reviewed external labs results. Lipids are under good control.  Essential hypertension Blood pressure well controlled Continue to monitor. No changes to current medications  Overall, she is stable from cardiac standpoint.  FINAL MEDICATION LIST END OF ENCOUNTER: No orders of the defined types were placed in this encounter.    Current Outpatient Medications:  acetaminophen (TYLENOL) 325 MG tablet, Take 650 mg by mouth every 6 (six) hours as needed for mild pain or headache., Disp: , Rfl:    amoxicillin (AMOXIL) 500 MG tablet, Take 2,000 mg by mouth See admin instructions. Before dental procedures, Disp: , Rfl:    aspirin EC 81 MG tablet, Take 81 mg by mouth at bedtime. , Disp: , Rfl:    Cholecalciferol (VITAMIN D3) 5000 units TABS, Take 5,000 Units by mouth at bedtime. , Disp: , Rfl:    Cyanocobalamin (VITAMIN B-12) 5000 MCG TBDP, Take 5,000 mcg by mouth at bedtime. , Disp: , Rfl:    diclofenac (VOLTAREN) 50 MG EC tablet, Take 50 mg by mouth 2 (two) times daily as needed., Disp: , Rfl:    FLUoxetine (PROZAC) 20 MG capsule, Take 20 mg by mouth at bedtime. , Disp: , Rfl:    losartan (COZAAR) 50 MG tablet, TAKE 1 TABLET BY MOUTH EVERY DAY, Disp: 90 tablet, Rfl: 3   rosuvastatin (CRESTOR) 40 MG tablet, Take 1 tablet (40 mg total) by mouth at bedtime. Increased by Dr. Terri Skains, per patient., Disp: 90 tablet, Rfl: 3   traMADol (ULTRAM) 50 MG tablet, take 1-2  tablet by oral route  every 8 hours as needed, Disp: , Rfl:   Orders Placed This Encounter  Procedures   PCV CAROTID DUPLEX (BILATERAL)   --Continue cardiac medications as reconciled in final medication list. --Return in about 1 year (around 08/03/2023) for HTN, HLD, Left carotid artery stenosis. Or sooner if needed. --Continue follow-up with your primary care physician  regarding the management of your other chronic comorbid conditions.  Patient's questions and concerns were addressed to her satisfaction. She voices understanding of the instructions provided during this encounter.     Ernst Spell, AGNP-C 08/02/2022, 4:28 PM Office: [phone]

## 2022-08-03 DIAGNOSIS — K648 Other hemorrhoids: Secondary | ICD-10-CM | POA: Diagnosis not present

## 2022-08-03 DIAGNOSIS — Z800 Family history of malignant neoplasm of digestive organs: Secondary | ICD-10-CM | POA: Diagnosis not present

## 2022-08-03 DIAGNOSIS — Z86010 Personal history of colonic polyps: Secondary | ICD-10-CM | POA: Diagnosis not present

## 2022-08-03 DIAGNOSIS — K5730 Diverticulosis of large intestine without perforation or abscess without bleeding: Secondary | ICD-10-CM | POA: Diagnosis not present

## 2022-08-10 DIAGNOSIS — G5601 Carpal tunnel syndrome, right upper limb: Secondary | ICD-10-CM | POA: Diagnosis not present

## 2022-08-16 ENCOUNTER — Ambulatory Visit: Payer: Medicare HMO | Admitting: Student

## 2022-09-05 DIAGNOSIS — H43813 Vitreous degeneration, bilateral: Secondary | ICD-10-CM | POA: Diagnosis not present

## 2022-09-05 DIAGNOSIS — H02831 Dermatochalasis of right upper eyelid: Secondary | ICD-10-CM | POA: Diagnosis not present

## 2022-09-05 DIAGNOSIS — H25813 Combined forms of age-related cataract, bilateral: Secondary | ICD-10-CM | POA: Diagnosis not present

## 2022-09-20 DIAGNOSIS — Z0100 Encounter for examination of eyes and vision without abnormal findings: Secondary | ICD-10-CM | POA: Diagnosis not present

## 2022-10-06 ENCOUNTER — Other Ambulatory Visit: Payer: Self-pay

## 2022-10-06 MED ORDER — LOSARTAN POTASSIUM 50 MG PO TABS: 50.0000 mg | ORAL_TABLET | Freq: Every day | ORAL | 3 refills | Status: DC

## 2022-11-02 ENCOUNTER — Telehealth: Payer: Self-pay | Admitting: *Deleted

## 2022-11-02 NOTE — Progress Notes (Signed)
  Care Coordination  Outreach Note  11/02/2022 Name: Kimberly Lowery MRN: [id number] DOB: 08-26-1946   Care Coordination Outreach Attempts: An unsuccessful telephone outreach was attempted today to offer the patient information about available care coordination services as a benefit of their health plan.   Segmented list   Follow Up Plan:  Additional outreach attempts will be made to offer the patient care coordination information and services.   Encounter Outcome:  No Answer  [place]  Direct Dial: [phone]

## 2022-11-07 NOTE — Progress Notes (Signed)
  Care Coordination   Note   11/07/2022 Name: Kimberly Lowery MRN: [id number] DOB: Apr 21, 1946  Kimberly Lowery is a 77 y.o. year old female who sees Josetta Huddle, MD for primary care. I reached out to [name] by phone today to offer care coordination services.  [name] was given information about Care Coordination services today including:   The Care Coordination services include support from the care team which includes your Nurse Coordinator, Clinical Social Worker, or Pharmacist.  The Care Coordination team is here to help remove barriers to the health concerns and goals most important to you. Care Coordination services are voluntary, and the patient may decline or stop services at any time by request to their care team member.   Care Coordination Consent Status: Patient agreed to services and verbal consent obtained.   Follow up plan:  Telephone appointment with care coordination team member scheduled for:  2/21  Encounter Outcome:  Pt. Scheduled  [place]  Direct Dial: [phone]

## 2022-11-21 ENCOUNTER — Telehealth: Payer: Self-pay | Admitting: *Deleted

## 2022-11-21 NOTE — Progress Notes (Unsigned)
  Care Coordination Note  11/21/2022 Name: Kimberly Lowery MRN: [id number] DOB: [birth year]  Kimberly Lowery is a 77 y.o. year old female who is a primary care patient of Josetta Huddle, MD and is actively engaged with the care management team. I reached out to [name] by phone today to assist with re-scheduling an initial visit with the RN Case Manager  Follow up plan: Unsuccessful telephone outreach attempt made. A HIPAA compliant phone message was left for the patient providing contact information and requesting a return call.   [place]  Direct Dial: [phone]

## 2022-11-24 NOTE — Progress Notes (Signed)
  Care Coordination Note  11/24/2022 Name: Kimberly Lowery MRN: [id number] DOB: 1946/03/22  Kimberly Lowery is a 77 y.o. year old female who is a primary care patient of Josetta Huddle, MD and is actively engaged with the care management team. I reached out to [name] by phone today to assist with re-scheduling an initial visit with the RN Case Manager  Follow up plan: Telephone appointment with care management team member scheduled for:11/25/22  [place]  Direct Dial: [phone]

## 2022-11-25 ENCOUNTER — Ambulatory Visit: Payer: Self-pay

## 2022-11-25 NOTE — Patient Outreach (Signed)
  Care Coordination   Initial Visit Note   11/25/2022 Name: Kimberly Lowery MRN: [id number] DOB: [birth year]  Kimberly Lowery is a 77 y.o. year old female who sees Josetta Huddle, MD for primary care. I spoke with  [name] by phone today.  What matters to the patients health and wellness today?  I am doing good.   We have moved to [facility] and I plan to start going to the water exercise classes.  States her joint pain is doing OK and she has recovered from carpel tunnel surgery.  Denies any issues with keeping her B/P under control.  States she tries to eat as healthy as she can.  Denies any further care coordinations needs at this time      Goals Addressed             This Visit's Progress    COMPLETED: Care Coordination Activities - no follow up required       Interventions Today    Flowsheet Row Most Recent Value  Chronic Disease   Chronic disease during today's visit Hypertension (HTN)  General Interventions   General Interventions Discussed/Reviewed General Interventions Discussed, Durable Medical Equipment (DME), Doctor Visits  Doctor Visits Discussed/Reviewed Doctor Visits Discussed, Annual Wellness Visits  Durable Medical Equipment (DME) BP Cuff  Exercise Interventions   Exercise Discussed/Reviewed Exercise Discussed  [Reviewed benefits of exercise and to try water classes at [facility]]  Education Interventions   Education Provided Provided Education  Provided Verbal Education On Exercise, Nutrition, Other  [care coordination services]  Nutrition Interventions   Nutrition Discussed/Reviewed Nutrition Discussed, Decreasing salt, Adding fruits and vegetables              SDOH assessments and interventions completed:  Yes  SDOH Interventions Today    Flowsheet Row Most Recent Value  SDOH Interventions   Food Insecurity Interventions Intervention Not Indicated  Housing Interventions Intervention Not Indicated  Transportation  Interventions Intervention Not Indicated  Utilities Interventions Intervention Not Indicated  Physical Activity Interventions Other (Comments)  [Reviewed fitness benefits at [place] Coordination Interventions:  Yes, provided   Follow up plan: No further intervention required.   Encounter Outcome:  Pt. Visit Completed  Peter Garter RN, BSN,CCM, CDE Care Management Coordinator [place] [facility] [phone]

## 2022-11-25 NOTE — Patient Instructions (Signed)
Visit Information  Thank you for taking time to visit with me today. Please don't hesitate to contact me if I can be of assistance to you.   Following are the goals we discussed today:   Goals Addressed             This Visit's Progress    COMPLETED: Care Coordination Activities - no follow up required       Interventions Today    Flowsheet Row Most Recent Value  Chronic Disease   Chronic disease during today's visit Hypertension (HTN)  General Interventions   General Interventions Discussed/Reviewed General Interventions Discussed, Durable Medical Equipment (DME), Doctor Visits  Doctor Visits Discussed/Reviewed Doctor Visits Discussed, Annual Wellness Visits  Durable Medical Equipment (DME) BP Cuff  Exercise Interventions   Exercise Discussed/Reviewed Exercise Discussed  [Reviewed benefits of exercise and to try water classes at [facility]]  Education Interventions   Education Provided Provided Education  Provided Verbal Education On Exercise, Nutrition, Other  [care coordination services]  Nutrition Interventions   Nutrition Discussed/Reviewed Nutrition Discussed, Decreasing salt, Adding fruits and vegetables               If you are experiencing a Mental Health or [place] or need someone to talk to, please call the Suicide and Crisis Lifeline: 988 call the [place] National Suicide Prevention Lifeline: (564)317-3728 or TTY: 417 847 0950 TTY (737)109-8986) to talk to a trained counselor call 1-800-273-TALK (toll free, 24 hour hotline) go to [facility] [place] [phone]) call 911   Patient verbalizes understanding of instructions and care plan provided today and agrees to view in [place]. Active MyChart status and patient understanding of how to access instructions and care plan via MyChart confirmed with patient.     No further follow up required:    Peter Garter RN, [place]quline [place],  [place] [facility] [phone]

## 2023-01-10 DIAGNOSIS — Z885 Allergy status to narcotic agent status: Secondary | ICD-10-CM | POA: Diagnosis not present

## 2023-01-10 DIAGNOSIS — R69 Illness, unspecified: Secondary | ICD-10-CM | POA: Diagnosis not present

## 2023-01-10 DIAGNOSIS — H269 Unspecified cataract: Secondary | ICD-10-CM | POA: Diagnosis not present

## 2023-01-10 DIAGNOSIS — E785 Hyperlipidemia, unspecified: Secondary | ICD-10-CM | POA: Diagnosis not present

## 2023-01-10 DIAGNOSIS — M1990 Unspecified osteoarthritis, unspecified site: Secondary | ICD-10-CM | POA: Diagnosis not present

## 2023-01-10 DIAGNOSIS — Z96649 Presence of unspecified artificial hip joint: Secondary | ICD-10-CM | POA: Diagnosis not present

## 2023-01-10 DIAGNOSIS — Z803 Family history of malignant neoplasm of breast: Secondary | ICD-10-CM | POA: Diagnosis not present

## 2023-01-10 DIAGNOSIS — Z8249 Family history of ischemic heart disease and other diseases of the circulatory system: Secondary | ICD-10-CM | POA: Diagnosis not present

## 2023-01-10 DIAGNOSIS — I10 Essential (primary) hypertension: Secondary | ICD-10-CM | POA: Diagnosis not present

## 2023-01-10 DIAGNOSIS — Z85828 Personal history of other malignant neoplasm of skin: Secondary | ICD-10-CM | POA: Diagnosis not present

## 2023-01-10 DIAGNOSIS — G4730 Sleep apnea, unspecified: Secondary | ICD-10-CM | POA: Diagnosis not present

## 2023-01-10 DIAGNOSIS — Z791 Long term (current) use of non-steroidal anti-inflammatories (NSAID): Secondary | ICD-10-CM | POA: Diagnosis not present

## 2023-01-13 DIAGNOSIS — M19012 Primary osteoarthritis, left shoulder: Secondary | ICD-10-CM | POA: Diagnosis not present

## 2023-01-20 DIAGNOSIS — M79672 Pain in left foot: Secondary | ICD-10-CM | POA: Diagnosis not present

## 2023-01-26 ENCOUNTER — Other Ambulatory Visit: Payer: Self-pay | Admitting: Internal Medicine

## 2023-01-26 DIAGNOSIS — Z13820 Encounter for screening for osteoporosis: Secondary | ICD-10-CM

## 2023-02-10 DIAGNOSIS — M19012 Primary osteoarthritis, left shoulder: Secondary | ICD-10-CM | POA: Diagnosis not present

## 2023-02-13 DIAGNOSIS — M19012 Primary osteoarthritis, left shoulder: Secondary | ICD-10-CM | POA: Diagnosis not present

## 2023-02-24 DIAGNOSIS — M25512 Pain in left shoulder: Secondary | ICD-10-CM | POA: Diagnosis not present

## 2023-03-03 DIAGNOSIS — M19072 Primary osteoarthritis, left ankle and foot: Secondary | ICD-10-CM | POA: Diagnosis not present

## 2023-03-06 DIAGNOSIS — M19012 Primary osteoarthritis, left shoulder: Secondary | ICD-10-CM | POA: Diagnosis not present

## 2023-03-06 DIAGNOSIS — M75122 Complete rotator cuff tear or rupture of left shoulder, not specified as traumatic: Secondary | ICD-10-CM | POA: Diagnosis not present

## 2023-03-15 ENCOUNTER — Other Ambulatory Visit: Payer: Self-pay

## 2023-03-15 DIAGNOSIS — E782 Mixed hyperlipidemia: Secondary | ICD-10-CM

## 2023-03-15 DIAGNOSIS — I6522 Occlusion and stenosis of left carotid artery: Secondary | ICD-10-CM

## 2023-03-15 MED ORDER — ROSUVASTATIN CALCIUM 40 MG PO TABS: 40.0000 mg | ORAL_TABLET | Freq: Every day | ORAL | 2 refills | Status: DC

## 2023-04-05 ENCOUNTER — Other Ambulatory Visit: Payer: Self-pay | Admitting: Internal Medicine

## 2023-04-05 DIAGNOSIS — Z1231 Encounter for screening mammogram for malignant neoplasm of breast: Secondary | ICD-10-CM

## 2023-04-07 DIAGNOSIS — M25531 Pain in right wrist: Secondary | ICD-10-CM | POA: Diagnosis not present

## 2023-04-12 ENCOUNTER — Ambulatory Visit
Admission: RE | Admit: 2023-04-12 | Discharge: 2023-04-12 | Disposition: A | Discharge status: Home / Self Care | Payer: Medicare HMO | Source: Ambulatory Visit | Attending: Internal Medicine | Admitting: Internal Medicine

## 2023-04-12 DIAGNOSIS — Z1231 Encounter for screening mammogram for malignant neoplasm of breast: Secondary | ICD-10-CM

## 2023-05-08 DIAGNOSIS — M25531 Pain in right wrist: Secondary | ICD-10-CM | POA: Diagnosis not present

## 2023-05-22 DIAGNOSIS — K219 Gastro-esophageal reflux disease without esophagitis: Secondary | ICD-10-CM | POA: Diagnosis not present

## 2023-05-22 DIAGNOSIS — R61 Generalized hyperhidrosis: Secondary | ICD-10-CM | POA: Diagnosis not present

## 2023-05-22 DIAGNOSIS — E559 Vitamin D deficiency, unspecified: Secondary | ICD-10-CM | POA: Diagnosis not present

## 2023-05-22 DIAGNOSIS — E785 Hyperlipidemia, unspecified: Secondary | ICD-10-CM | POA: Diagnosis not present

## 2023-05-22 LAB — LAB REPORT - SCANNED: EGFR: 71

## 2023-06-07 DIAGNOSIS — I6522 Occlusion and stenosis of left carotid artery: Secondary | ICD-10-CM | POA: Diagnosis not present

## 2023-06-07 DIAGNOSIS — Z23 Encounter for immunization: Secondary | ICD-10-CM | POA: Diagnosis not present

## 2023-06-07 DIAGNOSIS — Z0000 Encounter for general adult medical examination without abnormal findings: Secondary | ICD-10-CM | POA: Diagnosis not present

## 2023-06-07 DIAGNOSIS — K219 Gastro-esophageal reflux disease without esophagitis: Secondary | ICD-10-CM | POA: Diagnosis not present

## 2023-06-07 DIAGNOSIS — E559 Vitamin D deficiency, unspecified: Secondary | ICD-10-CM | POA: Diagnosis not present

## 2023-06-07 DIAGNOSIS — Z1331 Encounter for screening for depression: Secondary | ICD-10-CM | POA: Diagnosis not present

## 2023-06-07 DIAGNOSIS — E785 Hyperlipidemia, unspecified: Secondary | ICD-10-CM | POA: Diagnosis not present

## 2023-06-07 DIAGNOSIS — Z9181 History of falling: Secondary | ICD-10-CM | POA: Diagnosis not present

## 2023-06-07 DIAGNOSIS — K5730 Diverticulosis of large intestine without perforation or abscess without bleeding: Secondary | ICD-10-CM | POA: Diagnosis not present

## 2023-06-16 DIAGNOSIS — M25531 Pain in right wrist: Secondary | ICD-10-CM | POA: Diagnosis not present

## 2023-06-20 ENCOUNTER — Other Ambulatory Visit: Payer: Self-pay | Admitting: Cardiology

## 2023-06-23 DIAGNOSIS — R234 Changes in skin texture: Secondary | ICD-10-CM | POA: Diagnosis not present

## 2023-06-23 DIAGNOSIS — Z808 Family history of malignant neoplasm of other organs or systems: Secondary | ICD-10-CM | POA: Diagnosis not present

## 2023-06-23 DIAGNOSIS — D2272 Melanocytic nevi of left lower limb, including hip: Secondary | ICD-10-CM | POA: Diagnosis not present

## 2023-06-23 DIAGNOSIS — L578 Other skin changes due to chronic exposure to nonionizing radiation: Secondary | ICD-10-CM | POA: Diagnosis not present

## 2023-06-23 DIAGNOSIS — L570 Actinic keratosis: Secondary | ICD-10-CM | POA: Diagnosis not present

## 2023-06-23 DIAGNOSIS — L719 Rosacea, unspecified: Secondary | ICD-10-CM | POA: Diagnosis not present

## 2023-06-23 DIAGNOSIS — Z86018 Personal history of other benign neoplasm: Secondary | ICD-10-CM | POA: Diagnosis not present

## 2023-06-23 DIAGNOSIS — D2271 Melanocytic nevi of right lower limb, including hip: Secondary | ICD-10-CM | POA: Diagnosis not present

## 2023-06-23 DIAGNOSIS — D225 Melanocytic nevi of trunk: Secondary | ICD-10-CM | POA: Diagnosis not present

## 2023-06-23 DIAGNOSIS — Z85828 Personal history of other malignant neoplasm of skin: Secondary | ICD-10-CM | POA: Diagnosis not present

## 2023-07-10 ENCOUNTER — Other Ambulatory Visit: Payer: Medicare HMO

## 2023-07-24 DIAGNOSIS — M25532 Pain in left wrist: Secondary | ICD-10-CM | POA: Diagnosis not present

## 2023-07-26 ENCOUNTER — Telehealth: Payer: Self-pay | Admitting: Cardiology

## 2023-07-26 DIAGNOSIS — I6522 Occlusion and stenosis of left carotid artery: Secondary | ICD-10-CM

## 2023-07-26 NOTE — Telephone Encounter (Signed)
Pt has a order in by [facility] needs to be changed to [facility] so pt can schedule. Pt prefers something after 11/14

## 2023-07-26 NOTE — Telephone Encounter (Signed)
Andreas Blower to Kimberly Lowery  BG  06/12/23  3:51 PM Your upcoming Echo has been cancelled. [facility] is closing. We will be joining [facility] as of 06/26/2023. Someone will be contacting you to reschedule your Carotid Duplex appointment. Thank you for your understanding and patience.  Above message in pt's chart. Called to ensure that it was the carotid US she was intending to schedule, she verified yes. Order placed at this time and she understands she'll be called back to schedule. No further questions.

## 2023-08-01 ENCOUNTER — Ambulatory Visit: Payer: Medicare HMO | Admitting: Cardiology

## 2023-08-11 ENCOUNTER — Ambulatory Visit (HOSPITAL_COMMUNITY): Payer: Medicare HMO | Attending: Cardiovascular Disease

## 2023-08-22 ENCOUNTER — Ambulatory Visit: Payer: Medicare HMO | Attending: Cardiology | Admitting: Cardiology

## 2023-08-22 ENCOUNTER — Encounter: Payer: Self-pay | Admitting: Cardiology

## 2023-08-22 VITALS — BP 120/84 | HR 66 | Resp 16 | Ht 67.0 in | Wt 171.0 lb

## 2023-08-22 DIAGNOSIS — I6522 Occlusion and stenosis of left carotid artery: Secondary | ICD-10-CM

## 2023-08-22 DIAGNOSIS — I10 Essential (primary) hypertension: Secondary | ICD-10-CM | POA: Diagnosis not present

## 2023-08-22 DIAGNOSIS — E782 Mixed hyperlipidemia: Secondary | ICD-10-CM | POA: Diagnosis not present

## 2023-08-22 DIAGNOSIS — M654 Radial styloid tenosynovitis [de Quervain]: Secondary | ICD-10-CM | POA: Diagnosis not present

## 2023-08-22 DIAGNOSIS — G5602 Carpal tunnel syndrome, left upper limb: Secondary | ICD-10-CM | POA: Diagnosis not present

## 2023-08-22 NOTE — Progress Notes (Signed)
Cardiology Office Note:  .   Date:  08/22/2023  ID:  Kimberly Lowery, DOB August 11, 1946, MRN [id number] PCP:  Collene Mares, PA  Former Cardiology Providers: Dr. Yates Decamp, Donnelly Stager, NP, Elvin So, [place] [facility] Providers Cardiologist:  Tessa Lerner, [place] , [facility] (established care June 2021) Electrophysiologist:  None  Click to update primary MD,subspecialty MD or APP then REFRESH:1}    Chief Complaint  Patient presents with   Left carotid stenosis   Follow-up    1 year    History of Present Illness: .   Kimberly Lowery is a 77 y.o. Caucasian female whose past medical history and cardiovascular risk factors includes: left carotid artery disease, Hx of COVID 30 (Jan 2022), hypertension, hyperlipidemia, postmenopausal female, advanced age.   Patient presents today for a 1 year follow-up visit given her underlying carotid artery disease.  Since last office visit patient denies any anginal chest pain or heart failure symptoms.  She has undergone a right carpal tunnel release surgery and plans to undergo left hand surgery in the future.  No structured exercise program or daily routine.  She tries to keep yourself active at friend's home where she currently resides.  Patient is scheduled to have a carotid duplex later this week.  Review of Systems: .   Review of Systems  Cardiovascular:  Negative for chest pain, claudication, irregular heartbeat, leg swelling, near-syncope, orthopnea, palpitations, paroxysmal nocturnal dyspnea and syncope.  Respiratory:  Negative for shortness of breath.   Hematologic/Lymphatic: Negative for bleeding problem.    Studies Reviewed:   EKG: EKG Interpretation Date/Time:  Tuesday August 22 2023 10:45:38 EST Text Interpretation: Normal sinus rhythm Minimal voltage criteria for LVH, may be normal variant ( R in aVL ) Cannot rule out Anterior infarct , age undetermined When compared with ECG of 30-Apr-2018 10:36, No significant  change since last tracing Confirmed by Tessa Lerner [id number]) on 08/22/2023 10:58:00 AM  Echocardiogram: 03/05/2020: Normal LV systolic function with visual EF 60-65%. Left ventricle cavity is normal in size. Normal global wall motion. Normal diastolic filling pattern, normal LAP. Calculated EF 63%. Mild (Grade I) mitral regurgitation. Mild tricuspid regurgitation. Mild pulmonic regurgitation. No prior study for comparison.  Stress Testing: Lexiscan/modified Bruce Tetrofosmin stress test 04/13/2020: Normal myocardial perfusion. Stress LVEF calculated 44%, although visually appears normal. Low risk study.  Carotid artery duplex 07/20/2022: Duplex suggests stenosis in the right internal carotid artery (1-15%). Right ICA is tortuous and may be the source of bruit Duplex suggests stenosis in the left internal carotid artery (1-15%). Antegrade right vertebral artery flow. Antegrade left vertebral artery flow.   Risk Assessment/Calculations:   N/A   Labs:       Latest Ref Rng & Units 04/10/2020    3:04 PM 11/02/2018    1:36 PM 04/30/2018   11:02 AM  CBC  WBC 4.0 - 10.5 K/uL 7.5  7.9  5.5   Hemoglobin 12.0 - 15.0 g/dL 19.1  47.8  29.5   Hematocrit 36.0 - 46.0 % 39.8  42.7  42.2   Platelets 150 - 400 K/uL 251  278  257        Latest Ref Rng & Units 04/10/2020    3:04 PM 06/12/2019    9:37 AM 11/02/2018    1:36 PM  BMP  Glucose 70 - 99 mg/dL 99  621  308   BUN 8 - 23 mg/dL 15  16  12    Creatinine 0.44 - 1.00 mg/dL 6.57  0.80  0.84   BUN/Creat Ratio 12 - 28  20    Sodium 135 - 145 mmol/L 141  143  142   Potassium 3.5 - 5.1 mmol/L 3.9  5.1  4.2   Chloride 98 - 111 mmol/L 105  105  106   CO2 22 - 32 mmol/L 27  26  27    Calcium 8.9 - 10.3 mg/dL 9.0  9.0  9.3       Latest Ref Rng & Units 04/10/2020    3:04 PM 06/12/2019    9:37 AM 11/02/2018    1:36 PM  CMP  Glucose 70 - 99 mg/dL 99  657  846   BUN 8 - 23 mg/dL 15  16  12    Creatinine 0.44 - 1.00 mg/dL 9.62  9.52  8.41   Sodium 135 -  145 mmol/L 141  143  142   Potassium 3.5 - 5.1 mmol/L 3.9  5.1  4.2   Chloride 98 - 111 mmol/L 105  105  106   CO2 22 - 32 mmol/L 27  26  27    Calcium 8.9 - 10.3 mg/dL 9.0  9.0  9.3     No results found for: "CHOL", "HDL", "LDLCALC", "LDLDIRECT", "TRIG", "CHOLHDL" No results for input(s): "LIPOA" in the last 8760 hours. No components found for: "NTPROBNP" No results for input(s): "PROBNP" in the last 8760 hours. No results for input(s): "TSH" in the last 8760 hours.   Physical Exam:    Today's Vitals   08/22/23 1044  BP: 120/84  Pulse: 66  Resp: 16  SpO2: 96%  Weight: 171 lb (77.6 kg)  Height: 5\' 7"  (1.702 m)   Body mass index is 26.78 kg/m. Wt Readings from Last 3 Encounters:  08/22/23 171 lb (77.6 kg)  08/02/22 164 lb 12.8 oz (74.8 kg)  03/16/22 174 lb 9.6 oz (79.2 kg)    Physical Exam  Constitutional: No distress.  hemodynamically stable  Neck: No JVD present.  Cardiovascular: Normal rate, regular rhythm, S1 normal and S2 normal. Exam reveals no gallop, no S3 and no S4.  No murmur heard. Pulses:      Carotid pulses are  on the left side with bruit. Pulmonary/Chest: Effort normal and breath sounds normal. No stridor. She has no wheezes. She has no rales.  Abdominal: Soft. Bowel sounds are normal. She exhibits no distension. There is no abdominal tenderness.  Musculoskeletal:        General: No edema.     Cervical back: Neck supple.  Neurological: She is alert and oriented to person, place, and time. She has intact cranial nerves (2-12).  Skin: Skin is warm.   Impression & Recommendation(s):  Impression:   ICD-10-CM   1. Left carotid stenosis  I65.22 EKG 12-Lead    2. Mixed hyperlipidemia  E78.2     3. Essential hypertension  I10        Recommendation(s):  Left carotid stenosis Known history of left ICA disease.  Would like to repeat a carotid duplex to reevaluate disease progression and/or progression of disease. She is scheduled for carotid duplex  later this week. Currently on aspirin and statin therapy. No vision or neurological deficits.  Mixed hyperlipidemia Patient recently had labs with PCP-will request records.  Not available in Care Everywhere. Currently on Crestor 40 mg p.o. nightly. Denies myalgias.  Essential hypertension Office blood pressures are very well-controlled. Continue Cozaar 50 mg p.o. daily  Orders Placed:  Orders Placed This Encounter  Procedures  EKG 12-Lead   Final Medication List:   No orders of the defined types were placed in this encounter.   Medications Discontinued During This Encounter  Medication Reason   aspirin EC 81 MG tablet      Current Outpatient Medications:    acetaminophen (TYLENOL) 325 MG tablet, Take 650 mg by mouth every 6 (six) hours as needed for mild pain or headache., Disp: , Rfl:    amoxicillin (AMOXIL) 500 MG tablet, Take 2,000 mg by mouth See admin instructions. Before dental procedures, Disp: , Rfl:    Cholecalciferol (VITAMIN D3) 5000 units TABS, Take 5,000 Units by mouth at bedtime. , Disp: , Rfl:    Cyanocobalamin (VITAMIN B-12) 5000 MCG TBDP, Take 5,000 mcg by mouth at bedtime. , Disp: , Rfl:    diclofenac (VOLTAREN) 50 MG EC tablet, Take 50 mg by mouth daily., Disp: , Rfl:    FLUoxetine (PROZAC) 20 MG capsule, Take 20 mg by mouth daily., Disp: , Rfl:    losartan (COZAAR) 50 MG tablet, TAKE 1 TABLET BY MOUTH EVERY DAY, Disp: 90 tablet, Rfl: 3   rosuvastatin (CRESTOR) 40 MG tablet, Take 1 tablet (40 mg total) by mouth at bedtime. (Patient taking differently: Take 40 mg by mouth daily.), Disp: 90 tablet, Rfl: 2   traMADol (ULTRAM) 50 MG tablet, take 1-2  tablet by oral route  every 8 hours as needed, Disp: , Rfl:   Consent:   N/A  Disposition:   1 year sooner if needed Patient may be asked to follow-up sooner based on the results of the above-mentioned testing.  Her questions and concerns were addressed to her satisfaction. She voices understanding of the  recommendations provided during this encounter.    Signed, Tessa Lerner, DO, [facility] [place]  [facility]  [address] [place], [place] [address] 08/22/2023 5:03 PM

## 2023-08-22 NOTE — Patient Instructions (Signed)
Medication Instructions:  Your physician recommends that you continue on your current medications as directed. Please refer to the Current Medication list given to you today.  *If you need a refill on your cardiac medications before your next appointment, please call your pharmacy*  Lab Work: None ordered today. If you have labs (blood work) drawn today and your tests are completely normal, you will receive your results only by: MyChart Message (if you have MyChart) OR A paper copy in the mail If you have any lab test that is abnormal or we need to change your treatment, we will call you to review the results.  **Please have lab work from your PCP sent to our office**  Testing/Procedures: None ordered today.  Follow-Up: At [facility], you and your health needs are our priority.  As part of our continuing mission to provide you with exceptional heart care, we have created designated Provider Care Teams.  These Care Teams include your primary Cardiologist (physician) and Advanced Practice Providers (APPs -  Physician Assistants and Nurse Practitioners) who all work together to provide you with the care you need, when you need it.  Your next appointment:   1 year(s)  The format for your next appointment:   In Person  Provider:   Tessa Lerner, DO {

## 2023-08-25 ENCOUNTER — Ambulatory Visit (HOSPITAL_COMMUNITY)
Admission: RE | Admit: 2023-08-25 | Discharge: 2023-08-25 | Disposition: A | Discharge status: Home / Self Care | Payer: Medicare HMO | Source: Ambulatory Visit | Attending: Cardiovascular Disease | Admitting: Cardiovascular Disease

## 2023-08-25 DIAGNOSIS — I6522 Occlusion and stenosis of left carotid artery: Secondary | ICD-10-CM | POA: Insufficient documentation

## 2023-08-25 DIAGNOSIS — R0989 Other specified symptoms and signs involving the circulatory and respiratory systems: Secondary | ICD-10-CM | POA: Diagnosis not present

## 2023-08-25 DIAGNOSIS — R69 Illness, unspecified: Secondary | ICD-10-CM | POA: Diagnosis not present

## 2023-09-06 ENCOUNTER — Other Ambulatory Visit: Payer: Self-pay | Admitting: *Deleted

## 2023-09-06 DIAGNOSIS — I6522 Occlusion and stenosis of left carotid artery: Secondary | ICD-10-CM

## 2023-09-06 DIAGNOSIS — E782 Mixed hyperlipidemia: Secondary | ICD-10-CM

## 2023-09-06 MED ORDER — ROSUVASTATIN CALCIUM 40 MG PO TABS: 40.0000 mg | ORAL_TABLET | Freq: Every day | ORAL | 3 refills | Status: AC

## 2023-09-07 DIAGNOSIS — H25813 Combined forms of age-related cataract, bilateral: Secondary | ICD-10-CM | POA: Diagnosis not present

## 2023-09-08 DIAGNOSIS — Z0100 Encounter for examination of eyes and vision without abnormal findings: Secondary | ICD-10-CM | POA: Diagnosis not present

## 2023-09-21 DIAGNOSIS — M654 Radial styloid tenosynovitis [de Quervain]: Secondary | ICD-10-CM | POA: Diagnosis not present

## 2023-09-21 DIAGNOSIS — G5602 Carpal tunnel syndrome, left upper limb: Secondary | ICD-10-CM | POA: Diagnosis not present

## 2023-10-05 ENCOUNTER — Other Ambulatory Visit: Payer: Self-pay | Admitting: Internal Medicine

## 2023-10-05 DIAGNOSIS — Z13820 Encounter for screening for osteoporosis: Secondary | ICD-10-CM

## 2023-10-16 ENCOUNTER — Telehealth: Payer: Self-pay | Admitting: Cardiology

## 2023-10-16 NOTE — Telephone Encounter (Signed)
   Name: Kimberly Lowery  DOB: 12-23-1945  MRN: [id number]   Primary Cardiologist: Madonna Large, DO  Chart reviewed as part of pre-operative protocol coverage. Patient was contacted 10/16/2023 in reference to pre-operative risk assessment for pending surgery as outlined from a cardiac standpoint.   Regarding ASA therapy, we recommend continuation of ASA throughout the perioperative period.  However, if the surgeon feels that cessation of ASA is required in the perioperative period, it may be stopped 5-7 days prior to surgery with a plan to resume it as soon as felt to be feasible from a surgical standpoint in the post-operative period.   Therefore, based on ACC/AHA guidelines, the patient would be at acceptable risk for the planned procedure without further cardiovascular testing.   The patient was advised that if she develops new symptoms prior to surgery to contact our office to arrange for a follow-up visit, and she verbalized understanding.  I will route this recommendation to the requesting party via Epic fax function and remove from pre-op pool. Please call with questions.  Lamarr Satterfield, NP 10/16/2023, 1:46 PM

## 2023-10-16 NOTE — Telephone Encounter (Signed)
   Pre-operative Risk Assessment    Patient Name: Kimberly Lowery  DOB: 1946/05/05 MRN: [id number]      Request for Surgical Clearance    Procedure:   Lft Endoscopy Carpal Tunnel Release  Date of Surgery:  Clearance 10/30/23                                 Surgeon:  Dr Sebastian Socks Group or Practice Name:  [facility] Phone number:  [phone] Fax number:  [phone]   Type of Clearance Requested:   - Medical  - Pharmacy:  Hold    Def to cards   Type of Anesthesia:  MAC   Additional requests/questions:    SignedRojelio Kays   10/16/2023, 12:29 PM

## 2023-10-25 DIAGNOSIS — M654 Radial styloid tenosynovitis [de Quervain]: Secondary | ICD-10-CM | POA: Diagnosis not present

## 2023-10-25 DIAGNOSIS — G5602 Carpal tunnel syndrome, left upper limb: Secondary | ICD-10-CM | POA: Diagnosis not present

## 2023-11-07 DIAGNOSIS — N898 Other specified noninflammatory disorders of vagina: Secondary | ICD-10-CM | POA: Diagnosis not present

## 2023-11-07 DIAGNOSIS — B3731 Acute candidiasis of vulva and vagina: Secondary | ICD-10-CM | POA: Diagnosis not present

## 2024-05-15 ENCOUNTER — Other Ambulatory Visit: Payer: Self-pay | Admitting: Internal Medicine

## 2024-05-15 DIAGNOSIS — Z1231 Encounter for screening mammogram for malignant neoplasm of breast: Secondary | ICD-10-CM

## 2024-06-04 ENCOUNTER — Ambulatory Visit
Admission: RE | Admit: 2024-06-04 | Discharge: 2024-06-04 | Disposition: A | Discharge status: Home / Self Care | Source: Ambulatory Visit | Attending: Internal Medicine | Admitting: Internal Medicine

## 2024-06-04 DIAGNOSIS — E785 Hyperlipidemia, unspecified: Secondary | ICD-10-CM | POA: Diagnosis not present

## 2024-06-04 DIAGNOSIS — Z1231 Encounter for screening mammogram for malignant neoplasm of breast: Secondary | ICD-10-CM | POA: Diagnosis not present

## 2024-06-04 DIAGNOSIS — E559 Vitamin D deficiency, unspecified: Secondary | ICD-10-CM | POA: Diagnosis not present

## 2024-06-06 ENCOUNTER — Inpatient Hospital Stay (HOSPITAL_BASED_OUTPATIENT_CLINIC_OR_DEPARTMENT_OTHER): Admission: RE | Admit: 2024-06-06 | Source: Ambulatory Visit

## 2024-06-25 ENCOUNTER — Other Ambulatory Visit: Payer: Medicare HMO

## 2024-06-25 DIAGNOSIS — M65331 Trigger finger, right middle finger: Secondary | ICD-10-CM | POA: Diagnosis not present

## 2024-06-25 DIAGNOSIS — M65332 Trigger finger, left middle finger: Secondary | ICD-10-CM | POA: Diagnosis not present

## 2024-06-25 DIAGNOSIS — M65321 Trigger finger, right index finger: Secondary | ICD-10-CM | POA: Diagnosis not present

## 2024-06-26 ENCOUNTER — Other Ambulatory Visit

## 2024-06-26 DIAGNOSIS — Z13820 Encounter for screening for osteoporosis: Secondary | ICD-10-CM | POA: Diagnosis not present

## 2024-07-03 DIAGNOSIS — D2271 Melanocytic nevi of right lower limb, including hip: Secondary | ICD-10-CM | POA: Diagnosis not present

## 2024-07-03 DIAGNOSIS — Z85828 Personal history of other malignant neoplasm of skin: Secondary | ICD-10-CM | POA: Diagnosis not present

## 2024-07-03 DIAGNOSIS — D225 Melanocytic nevi of trunk: Secondary | ICD-10-CM | POA: Diagnosis not present

## 2024-07-03 DIAGNOSIS — L719 Rosacea, unspecified: Secondary | ICD-10-CM | POA: Diagnosis not present

## 2024-07-03 DIAGNOSIS — W57XXXA Bitten or stung by nonvenomous insect and other nonvenomous arthropods, initial encounter: Secondary | ICD-10-CM | POA: Diagnosis not present

## 2024-07-03 DIAGNOSIS — L570 Actinic keratosis: Secondary | ICD-10-CM | POA: Diagnosis not present

## 2024-07-03 DIAGNOSIS — L578 Other skin changes due to chronic exposure to nonionizing radiation: Secondary | ICD-10-CM | POA: Diagnosis not present

## 2024-07-03 DIAGNOSIS — D2272 Melanocytic nevi of left lower limb, including hip: Secondary | ICD-10-CM | POA: Diagnosis not present

## 2024-07-03 DIAGNOSIS — Z86018 Personal history of other benign neoplasm: Secondary | ICD-10-CM | POA: Diagnosis not present

## 2024-07-03 DIAGNOSIS — S80869A Insect bite (nonvenomous), unspecified lower leg, initial encounter: Secondary | ICD-10-CM | POA: Diagnosis not present

## 2024-07-03 DIAGNOSIS — L282 Other prurigo: Secondary | ICD-10-CM | POA: Diagnosis not present

## 2024-07-03 DIAGNOSIS — D485 Neoplasm of uncertain behavior of skin: Secondary | ICD-10-CM | POA: Diagnosis not present

## 2024-07-03 DIAGNOSIS — Z808 Family history of malignant neoplasm of other organs or systems: Secondary | ICD-10-CM | POA: Diagnosis not present

## 2024-07-25 DIAGNOSIS — M65331 Trigger finger, right middle finger: Secondary | ICD-10-CM | POA: Diagnosis not present

## 2024-07-25 DIAGNOSIS — F3342 Major depressive disorder, recurrent, in full remission: Secondary | ICD-10-CM | POA: Diagnosis not present

## 2024-07-25 DIAGNOSIS — E559 Vitamin D deficiency, unspecified: Secondary | ICD-10-CM | POA: Diagnosis not present

## 2024-07-25 DIAGNOSIS — K219 Gastro-esophageal reflux disease without esophagitis: Secondary | ICD-10-CM | POA: Diagnosis not present

## 2024-07-25 DIAGNOSIS — E785 Hyperlipidemia, unspecified: Secondary | ICD-10-CM | POA: Diagnosis not present

## 2024-07-25 DIAGNOSIS — M47817 Spondylosis without myelopathy or radiculopathy, lumbosacral region: Secondary | ICD-10-CM | POA: Diagnosis not present

## 2024-07-25 DIAGNOSIS — M1612 Unilateral primary osteoarthritis, left hip: Secondary | ICD-10-CM | POA: Diagnosis not present

## 2024-07-25 DIAGNOSIS — I6522 Occlusion and stenosis of left carotid artery: Secondary | ICD-10-CM | POA: Diagnosis not present

## 2024-07-25 DIAGNOSIS — M65332 Trigger finger, left middle finger: Secondary | ICD-10-CM | POA: Diagnosis not present

## 2024-07-25 DIAGNOSIS — K5730 Diverticulosis of large intestine without perforation or abscess without bleeding: Secondary | ICD-10-CM | POA: Diagnosis not present

## 2024-07-25 DIAGNOSIS — Z0000 Encounter for general adult medical examination without abnormal findings: Secondary | ICD-10-CM | POA: Diagnosis not present

## 2024-07-25 DIAGNOSIS — I10 Essential (primary) hypertension: Secondary | ICD-10-CM | POA: Diagnosis not present

## 2024-08-09 ENCOUNTER — Other Ambulatory Visit: Payer: Self-pay

## 2024-08-12 MED ORDER — LOSARTAN POTASSIUM 50 MG PO TABS: 50.0000 mg | ORAL_TABLET | Freq: Every day | ORAL | 0 refills | Status: DC

## 2024-08-26 DIAGNOSIS — M65331 Trigger finger, right middle finger: Secondary | ICD-10-CM | POA: Diagnosis not present

## 2024-08-26 DIAGNOSIS — M65332 Trigger finger, left middle finger: Secondary | ICD-10-CM | POA: Diagnosis not present

## 2024-08-28 ENCOUNTER — Other Ambulatory Visit

## 2024-09-05 ENCOUNTER — Other Ambulatory Visit: Payer: Self-pay | Admitting: Cardiology

## 2024-09-19 ENCOUNTER — Other Ambulatory Visit: Payer: Self-pay | Admitting: Cardiology

## 2024-10-09 ENCOUNTER — Observation Stay (HOSPITAL_BASED_OUTPATIENT_CLINIC_OR_DEPARTMENT_OTHER): Admission: EM | Admit: 2024-10-09 | Discharge: 2024-10-11 | Disposition: A | Discharge status: Home / Self Care

## 2024-10-09 ENCOUNTER — Emergency Department (HOSPITAL_BASED_OUTPATIENT_CLINIC_OR_DEPARTMENT_OTHER)

## 2024-10-09 ENCOUNTER — Encounter (HOSPITAL_BASED_OUTPATIENT_CLINIC_OR_DEPARTMENT_OTHER): Payer: Self-pay | Admitting: *Deleted

## 2024-10-09 ENCOUNTER — Other Ambulatory Visit: Payer: Self-pay

## 2024-10-09 DIAGNOSIS — D696 Thrombocytopenia, unspecified: Secondary | ICD-10-CM | POA: Diagnosis not present

## 2024-10-09 DIAGNOSIS — R112 Nausea with vomiting, unspecified: Secondary | ICD-10-CM | POA: Diagnosis not present

## 2024-10-09 DIAGNOSIS — G4489 Other headache syndrome: Secondary | ICD-10-CM | POA: Diagnosis not present

## 2024-10-09 DIAGNOSIS — D72819 Decreased white blood cell count, unspecified: Secondary | ICD-10-CM

## 2024-10-09 DIAGNOSIS — E785 Hyperlipidemia, unspecified: Secondary | ICD-10-CM | POA: Diagnosis present

## 2024-10-09 DIAGNOSIS — M179 Osteoarthritis of knee, unspecified: Secondary | ICD-10-CM | POA: Diagnosis not present

## 2024-10-09 DIAGNOSIS — J45909 Unspecified asthma, uncomplicated: Secondary | ICD-10-CM | POA: Insufficient documentation

## 2024-10-09 DIAGNOSIS — J9601 Acute respiratory failure with hypoxia: Secondary | ICD-10-CM

## 2024-10-09 DIAGNOSIS — Z96652 Presence of left artificial knee joint: Secondary | ICD-10-CM | POA: Diagnosis not present

## 2024-10-09 DIAGNOSIS — Z79899 Other long term (current) drug therapy: Secondary | ICD-10-CM | POA: Insufficient documentation

## 2024-10-09 DIAGNOSIS — M169 Osteoarthritis of hip, unspecified: Secondary | ICD-10-CM | POA: Diagnosis not present

## 2024-10-09 DIAGNOSIS — F39 Unspecified mood [affective] disorder: Secondary | ICD-10-CM | POA: Diagnosis present

## 2024-10-09 DIAGNOSIS — R519 Headache, unspecified: Secondary | ICD-10-CM | POA: Diagnosis not present

## 2024-10-09 DIAGNOSIS — F308 Other manic episodes: Secondary | ICD-10-CM | POA: Diagnosis not present

## 2024-10-09 DIAGNOSIS — M1712 Unilateral primary osteoarthritis, left knee: Secondary | ICD-10-CM | POA: Diagnosis present

## 2024-10-09 DIAGNOSIS — J111 Influenza due to unidentified influenza virus with other respiratory manifestations: Principal | ICD-10-CM

## 2024-10-09 DIAGNOSIS — Z96643 Presence of artificial hip joint, bilateral: Secondary | ICD-10-CM | POA: Diagnosis not present

## 2024-10-09 DIAGNOSIS — J101 Influenza due to other identified influenza virus with other respiratory manifestations: Secondary | ICD-10-CM

## 2024-10-09 DIAGNOSIS — M1612 Unilateral primary osteoarthritis, left hip: Secondary | ICD-10-CM | POA: Diagnosis present

## 2024-10-09 DIAGNOSIS — Z743 Need for continuous supervision: Secondary | ICD-10-CM | POA: Diagnosis not present

## 2024-10-09 DIAGNOSIS — I10 Essential (primary) hypertension: Secondary | ICD-10-CM | POA: Diagnosis not present

## 2024-10-09 DIAGNOSIS — Z981 Arthrodesis status: Secondary | ICD-10-CM

## 2024-10-09 LAB — COMPREHENSIVE METABOLIC PANEL WITH GFR
ALT: 22 U/L (ref 0–44)
AST: 32 U/L (ref 15–41)
Albumin: 4.1 g/dL (ref 3.5–5.0)
Alkaline Phosphatase: 60 U/L (ref 38–126)
Anion gap: 8 (ref 5–15)
BUN: 11 mg/dL (ref 8–23)
CO2: 30 mmol/L (ref 22–32)
Calcium: 8.8 mg/dL — ABNORMAL LOW (ref 8.9–10.3)
Chloride: 95 mmol/L — ABNORMAL LOW (ref 98–111)
Creatinine, Ser: 0.78 mg/dL (ref 0.44–1.00)
GFR, Estimated: >60 mL/min
Glucose, Bld: 118 mg/dL — ABNORMAL HIGH (ref 70–99)
Potassium: 4.1 mmol/L (ref 3.5–5.1)
Sodium: 134 mmol/L — ABNORMAL LOW (ref 135–145)
Total Bilirubin: 0.5 mg/dL (ref 0.0–1.2)
Total Protein: 6.8 g/dL (ref 6.5–8.1)

## 2024-10-09 LAB — CBC WITH DIFFERENTIAL/PLATELET
Abs Immature Granulocytes: 0.01 K/uL (ref 0.00–0.07)
Basophils Absolute: 0 K/uL (ref 0.0–0.1)
Basophils Relative: 0 %
Eosinophils Absolute: 0 K/uL (ref 0.0–0.5)
Eosinophils Relative: 0 %
HCT: 36.8 % (ref 36.0–46.0)
Hemoglobin: 12.1 g/dL (ref 12.0–15.0)
Immature Granulocytes: 0 %
Lymphocytes Relative: 10 %
Lymphs Abs: 0.4 K/uL — ABNORMAL LOW (ref 0.7–4.0)
MCH: 30.5 pg (ref 26.0–34.0)
MCHC: 32.9 g/dL (ref 30.0–36.0)
MCV: 92.7 fL (ref 80.0–100.0)
Monocytes Absolute: 0.4 K/uL (ref 0.1–1.0)
Monocytes Relative: 10 %
Neutro Abs: 2.9 K/uL (ref 1.7–7.7)
Neutrophils Relative %: 80 %
Platelets: 145 K/uL — ABNORMAL LOW (ref 150–400)
RBC: 3.97 MIL/uL (ref 3.87–5.11)
RDW: 14.1 % (ref 11.5–15.5)
WBC: 3.7 K/uL — ABNORMAL LOW (ref 4.0–10.5)
nRBC: 0 % (ref 0.0–0.2)

## 2024-10-09 LAB — CBC
HCT: 35.8 % — ABNORMAL LOW (ref 36.0–46.0)
Hemoglobin: 11.6 g/dL — ABNORMAL LOW (ref 12.0–15.0)
MCH: 31.1 pg (ref 26.0–34.0)
MCHC: 32.4 g/dL (ref 30.0–36.0)
MCV: 96 fL (ref 80.0–100.0)
Platelets: 133 K/uL — ABNORMAL LOW (ref 150–400)
RBC: 3.73 MIL/uL — ABNORMAL LOW (ref 3.87–5.11)
RDW: 14.1 % (ref 11.5–15.5)
WBC: 2.8 K/uL — ABNORMAL LOW (ref 4.0–10.5)
nRBC: 0 % (ref 0.0–0.2)

## 2024-10-09 LAB — CREATININE, SERUM
Creatinine, Ser: 0.89 mg/dL (ref 0.44–1.00)
GFR, Estimated: >60 mL/min

## 2024-10-09 LAB — LACTIC ACID, PLASMA: Lactic Acid, Venous: 0.8 mmol/L (ref 0.5–1.9)

## 2024-10-09 MED ORDER — ONDANSETRON HCL 4 MG/2ML IJ SOLN: 4.0000 mg | Freq: Four times a day (QID) | INTRAMUSCULAR | Status: DC | PRN

## 2024-10-09 MED ORDER — METOCLOPRAMIDE HCL 5 MG/ML IJ SOLN
10.0000 mg | Freq: Four times a day (QID) | INTRAMUSCULAR | Status: DC | PRN
  Administered 2024-10-09 – 2024-10-10 (×2): 10 mg via INTRAVENOUS
  Filled 2024-10-09 (×2): qty 2

## 2024-10-09 MED ORDER — KETOROLAC TROMETHAMINE 15 MG/ML IJ SOLN
15.0000 mg | Freq: Four times a day (QID) | INTRAMUSCULAR | Status: DC | PRN
  Administered 2024-10-09 – 2024-10-10 (×2): 15 mg via INTRAVENOUS
  Filled 2024-10-09 (×2): qty 1

## 2024-10-09 MED ORDER — ENOXAPARIN SODIUM 40 MG/0.4ML IJ SOSY
40.0000 mg | PREFILLED_SYRINGE | INTRAMUSCULAR | Status: DC
  Filled 2024-10-09 (×2): qty 0.4

## 2024-10-09 MED ORDER — TRAMADOL HCL 50 MG PO TABS: 50.0000 mg | ORAL_TABLET | Freq: Three times a day (TID) | ORAL | Status: DC | PRN

## 2024-10-09 MED ORDER — GUAIFENESIN ER 600 MG PO TB12: 600.0000 mg | ORAL_TABLET | Freq: Two times a day (BID) | ORAL | Status: DC | PRN

## 2024-10-09 MED ORDER — ACETAMINOPHEN 500 MG PO TABS
1000.0000 mg | ORAL_TABLET | Freq: Once | ORAL | Status: AC
  Administered 2024-10-09: 1000 mg via ORAL
  Filled 2024-10-09: qty 2

## 2024-10-09 MED ORDER — OSELTAMIVIR PHOSPHATE 75 MG PO CAPS
75.0000 mg | ORAL_CAPSULE | Freq: Two times a day (BID) | ORAL | Status: DC
  Administered 2024-10-09 – 2024-10-11 (×5): 75 mg via ORAL
  Filled 2024-10-09 (×6): qty 1

## 2024-10-09 MED ORDER — SODIUM CHLORIDE 0.9 % IV BOLUS
1000.0000 mL | Freq: Once | INTRAVENOUS | Status: AC
  Administered 2024-10-09: 1000 mL via INTRAVENOUS

## 2024-10-09 MED ORDER — ACETAMINOPHEN 325 MG PO TABS
650.0000 mg | ORAL_TABLET | Freq: Four times a day (QID) | ORAL | Status: DC | PRN
  Administered 2024-10-09 – 2024-10-11 (×4): 650 mg via ORAL
  Filled 2024-10-09 (×4): qty 2

## 2024-10-09 MED ORDER — ALBUTEROL SULFATE (2.5 MG/3ML) 0.083% IN NEBU: 2.5000 mg | INHALATION_SOLUTION | Freq: Four times a day (QID) | RESPIRATORY_TRACT | Status: DC | PRN

## 2024-10-09 MED ORDER — DIPHENHYDRAMINE HCL 50 MG/ML IJ SOLN
12.5000 mg | Freq: Once | INTRAMUSCULAR | Status: AC
  Administered 2024-10-09: 12.5 mg via INTRAVENOUS
  Filled 2024-10-09: qty 1

## 2024-10-09 MED ORDER — DIPHENHYDRAMINE HCL 50 MG/ML IJ SOLN
12.5000 mg | Freq: Four times a day (QID) | INTRAMUSCULAR | Status: DC | PRN
  Administered 2024-10-10 – 2024-10-11 (×3): 12.5 mg via INTRAVENOUS
  Filled 2024-10-09 (×3): qty 1

## 2024-10-09 MED ORDER — LACTATED RINGERS IV SOLN: INTRAVENOUS | Status: AC

## 2024-10-09 MED ORDER — METOCLOPRAMIDE HCL 5 MG/ML IJ SOLN
5.0000 mg | Freq: Once | INTRAMUSCULAR | Status: AC
  Administered 2024-10-09: 5 mg via INTRAVENOUS
  Filled 2024-10-09: qty 2

## 2024-10-09 NOTE — ED Notes (Signed)
 Called Monisha at [facility] for transport 14:52-TC

## 2024-10-09 NOTE — Plan of Care (Signed)
 [facility] emergency department Kimberly Lowery or Kimberly Lowery telemetry bed transfer:  79 year old female past medical history of essential hypertension, hyperlipidemia and left carotid stenosis presents emergency department complaining of nausea, vomiting and exposed to flu from family.  Patient reported feeling sick since Monday with headache.  Continues to have nausea vomiting.  Patient reported she is unable to keep hardly anything down.  Went to [place] walk-in clinic-found to be flu positive and and started on Tamiflu .  The ED with ambulation O2 sat dropped 88% currently on 2 L oxygen O2 sat 93-97.  Febrile otherwise hemodynamically stable.  Lab, normal lactic acid level.  Blood cultures are in process.  CBC showing low WBC 3.7 low platelet count 145.  Normal hemoglobin and hematocrit. CMP showing low sodium 134 and low calcium  8.8.  Chest x-ray no active disease process.   In the ED patient received Benadryl , Reglan  and 1 L of NS bolus. Received first dose of Tamiflu  at home today.   Hospitalist consulted for further evaluation management of intractable nausea vomiting and acute hypoxic respiratory failure in setting of flu.   [facility] will assume care on arrival to accepting facility. Until arrival, care as per EDP. However, [facility] available 24/7 for questions and assistance. Check [email or web address] for on-call coverage. Nursing staff, please call [facility] Admits & Consults System-Wide number under Amion on patient's arrival so appropriate admitting provider can evaluate the pt.   Author: Loana Salvaggio, MD  [facility]

## 2024-10-09 NOTE — ED Notes (Signed)
 Patient assisted to restroom. Upon return to room, patient provided with oatmeal for breakfast.

## 2024-10-09 NOTE — Assessment & Plan Note (Signed)
 Thrombocytopenia Suspect due to viral infection. WBC 3.7 on admission. Platelets 145 on admission. --Monitor CBC

## 2024-10-09 NOTE — Assessment & Plan Note (Addendum)
 In setting of influenza A infection.   --IV antiemetics PRN --Monitor & replace electrolytes --Diet as tolerated --Maintenance IV fluids: LR @ 75 cc/hr --Mgmt of flu as outlined

## 2024-10-09 NOTE — Assessment & Plan Note (Signed)
 Resume home Prozac  pending med history

## 2024-10-09 NOTE — Assessment & Plan Note (Signed)
 Initial BP in the ED was elevated 165/77, since then controlled and soft at times as low as 98/55 earlier today. --Resume home BP regimen as BP tolerates, pending med history.  Appears patient takes losartan  50 mg daily

## 2024-10-09 NOTE — Assessment & Plan Note (Addendum)
 Just started on Tamiflu  this AM --Continue Tamiflu  to complete 5 day course  Headache - due to flu --Tylenol  PRN --Headache cocktail with IV Toradol  / Reglan  / Benadryl  PRN if refractory

## 2024-10-09 NOTE — ED Notes (Signed)
 Pt ambulated to restroom without oxygen and upon returning to room, pulse ox reading was 89% on room air. After approx 2-3 minutes of rest, pt's sat increased to 91-92% on room air. Pt's oxygen sat continues to decrease to 88-89% even while at rest. Pt placed back on 2L of O2 via nasal cannula. Pt denies SOB. Dr Emil aware

## 2024-10-09 NOTE — ED Notes (Signed)
 Patient resting comfortably in bed. Respirations even and unlabored at this time. Call bell within reach.

## 2024-10-09 NOTE — Assessment & Plan Note (Addendum)
 Pt O2 sats dropped to 88% on RA with ambulation in the ED.  Currently on 2 L/min [place] O2 with spO2 93-97%.  Not on O2 at baseline.  Due to influenza A infection most likely. --Supplement O2 to keep spO2 > 92% --Check ambulatory O2 sats daily --Wean as tolerated --Incentive spirometer --Albuterol  nebs PRN --Mgmt of flu as outlined

## 2024-10-09 NOTE — ED Notes (Signed)
 Virtual hospitalist at bedside

## 2024-10-09 NOTE — Assessment & Plan Note (Signed)
 Resume home tramadol  as needed.

## 2024-10-09 NOTE — ED Provider Notes (Signed)
 " [place] EMERGENCY DEPARTMENT AT [facility] Provider Note   CSN: [id number] Arrival date & time: 10/09/24  9767     Patient presents with: Influenza   Kimberly Lowery is a 79 y.o. female.   79 yo F with a chief complaint of cough congestion fever chills myalgias nausea vomiting diarrhea going on for couple days now.  Feels like she can hardly keep anything down.  Has tried liquid IV and Pedialyte.  Has been sick with similar illness.  There was some exposure to someone with the flu.  Went to [place] walk-in clinic and started on Tamiflu .  Zofran .  Having continued vomiting despite.     Influenza      Prior to Admission medications  Medication Sig Start Date End Date Taking? Authorizing Provider  acetaminophen  (TYLENOL ) 325 MG tablet Take 650 mg by mouth every 6 (six) hours as needed for mild pain or headache.    [provider]  amoxicillin (AMOXIL) 500 MG tablet Take 2,000 mg by mouth See admin instructions. Before dental procedures    [provider]  Cholecalciferol  (VITAMIN D3) 5000 units TABS Take 5,000 Units by mouth at bedtime.     [provider]  Cyanocobalamin (VITAMIN B-12) 5000 MCG TBDP Take 5,000 mcg by mouth at bedtime.     [provider]  diclofenac  (VOLTAREN ) 50 MG EC tablet Take 50 mg by mouth daily. 01/28/22   [provider]  FLUoxetine  (PROZAC ) 20 MG capsule Take 20 mg by mouth daily.    [provider]  losartan  (COZAAR ) 50 MG tablet TAKE 1 TABLET BY MOUTH EVERY DAY 09/11/24   Tolia, Sunit, DO  rosuvastatin  (CRESTOR ) 40 MG tablet Take 1 tablet (40 mg total) by mouth at bedtime. 09/06/23 08/31/24  Tolia, Sunit, DO  traMADol  (ULTRAM ) 50 MG tablet take 1-2  tablet by oral route  every 8 hours as needed 12/01/20   [provider]    Allergies: Shellfish allergy, Tape, Codeine, Dilaudid  [hydromorphone  hcl], Florastor kids [saccharomyces boulardii], Garlic, Morphine  sulfate, Nucynta  [tapentadol], and Percocet [oxycodone-acetaminophen ]    Review of Systems  Updated Vital Signs BP 136/69   Pulse 71   Temp 98.6 F (37 C) (Oral)   Resp 17   Ht 5' 6 (1.676 m)   Wt 77.1 kg Comment: Wt from 08/2024  SpO2 93%   BMI 27.44 kg/m   Physical Exam Vitals and nursing note reviewed.  Constitutional:      General: She is not in acute distress.    Appearance: She is well-developed. She is not diaphoretic.  HENT:     Head: Normocephalic and atraumatic.  Eyes:     Pupils: Pupils are equal, round, and reactive to light.  Cardiovascular:     Rate and Rhythm: Normal rate and regular rhythm.     Heart sounds: No murmur heard.    No friction rub. No gallop.  Pulmonary:     Effort: Pulmonary effort is normal.     Breath sounds: No wheezing or rales.  Abdominal:     General: There is no distension.     Palpations: Abdomen is soft.     Tenderness: There is no abdominal tenderness.  Musculoskeletal:        General: No tenderness.     Cervical back: Normal range of motion and neck supple.  Skin:    General: Skin is warm and dry.  Neurological:     Mental Status: She is alert and oriented to person,  place, and time.  Psychiatric:        Behavior: Behavior normal.     (all labs ordered are listed, but only abnormal results are displayed) Labs Reviewed  CBC WITH DIFFERENTIAL/PLATELET - Abnormal; Notable for the following components:      Result Value   WBC 3.7 (*)    Platelets 145 (*)    Lymphs Abs 0.4 (*)    All other components within normal limits  COMPREHENSIVE METABOLIC PANEL WITH GFR - Abnormal; Notable for the following components:   Sodium 134 (*)    Chloride 95 (*)    Glucose, Bld 118 (*)    Calcium  8.8 (*)    All other components within normal limits  CULTURE, BLOOD (ROUTINE X 2)  CULTURE, BLOOD (ROUTINE X 2)  LACTIC ACID, PLASMA    EKG: EKG Interpretation Date/Time:  Wednesday October 09 2024 04:00:38 EST Ventricular Rate:  76 PR  Interval:  170 QRS Duration:  102 QT Interval:  403 QTC Calculation: 454 R Axis:   32  Text Interpretation: Sinus rhythm No significant change since last tracing Confirmed by Emil Share [phone]) on 10/09/2024 4:34:02 AM  Radiology: [facility] Chest Port 1 View Result Date: 10/09/2024 EXAM: 1 VIEW(S) XRAY OF THE CHEST 10/09/2024 03:05:59 AM COMPARISON: 04/10/2020. CLINICAL HISTORY: Cough. FINDINGS: LUNGS AND PLEURA: No focal pulmonary opacity. No pleural effusion. No pneumothorax. HEART AND MEDIASTINUM: No acute abnormality of the cardiac and mediastinal silhouettes. BONES AND SOFT TISSUES: No acute osseous abnormality. IMPRESSION: 1. No active cardiopulmonary disease. Electronically signed by: Franky Crease MD 10/09/2024 03:12 AM EST RP Workstation: [id number]     .Critical Care  Performed by: Emil Share, DO Authorized by: Emil Share, DO   Critical care provider statement:    Critical care time (minutes):  35   Critical care time was exclusive of:  Separately billable procedures and treating other patients   Critical care was time spent personally by me on the following activities:  Development of treatment plan with patient or surrogate, discussions with consultants, evaluation of patient's response to treatment, examination of patient, ordering and review of laboratory studies, ordering and review of radiographic studies, ordering and performing treatments and interventions, pulse oximetry, re-evaluation of patient's condition and review of old charts   Care discussed with: admitting provider      Medications Ordered in the ED  oseltamivir  (TAMIFLU ) capsule 75 mg (has no administration in time range)  ondansetron  (ZOFRAN ) injection 4 mg (has no administration in time range)  lactated ringers  infusion ( Intravenous New Bag/Given 10/09/24 0610)  acetaminophen  (TYLENOL ) tablet 650 mg (has no administration in time range)  sodium chloride  0.9 % bolus 1,000 mL (0 mLs Intravenous Stopped 10/09/24 0524)   metoCLOPramide  (REGLAN ) injection 5 mg (5 mg Intravenous Given 10/09/24 0348)  diphenhydrAMINE  (BENADRYL ) injection 12.5 mg (12.5 mg Intravenous Given 10/09/24 0348)  acetaminophen  (TYLENOL ) tablet 1,000 mg (1,000 mg Oral Given 10/09/24 0346)                                    Medical Decision Making Amount and/or Complexity of Data Reviewed Labs: ordered. Radiology: ordered. ECG/medicine tests: ordered.  Risk OTC drugs. Prescription drug management. Decision regarding hospitalization.   79 yo F with a chief complaint of cough congestion fever chills myalgias.  Going on for couple days.  Patient was concerned about nausea vomiting and diarrhea.  Seen at [facility] walk-in clinic prescribe Zofran   Tamiflu .  Has not had significant improvement.  Came to the ED for evaluation.  Found to be hypoxic.  Will give IV fluids antiemetics.  Lab work reassess.  Patient feeling bit better after antiemetics and fluids.  No anemia, mild hyponatremia hypochloremia.  Lactate normal.  Chest x-ray on my independent interpretation without obvious focal infiltrate or pneumothorax.  Patient remains hypoxic with minimal exertion.  Not on oxygen at home.  Will discuss with medicine.  The patients results and plan were reviewed and discussed.   Any x-rays performed were independently reviewed by myself.   Differential diagnosis were considered with the presenting HPI.  Medications  oseltamivir  (TAMIFLU ) capsule 75 mg (has no administration in time range)  ondansetron  (ZOFRAN ) injection 4 mg (has no administration in time range)  lactated ringers  infusion ( Intravenous New Bag/Given 10/09/24 0610)  acetaminophen  (TYLENOL ) tablet 650 mg (has no administration in time range)  sodium chloride  0.9 % bolus 1,000 mL (0 mLs Intravenous Stopped 10/09/24 0524)  metoCLOPramide  (REGLAN ) injection 5 mg (5 mg Intravenous Given 10/09/24 0348)  diphenhydrAMINE  (BENADRYL ) injection 12.5 mg (12.5 mg Intravenous Given 10/09/24 0348)   acetaminophen  (TYLENOL ) tablet 1,000 mg (1,000 mg Oral Given 10/09/24 0346)    Vitals:   10/09/24 0249 10/09/24 0401 10/09/24 0442 10/09/24 0500  BP: (!) 165/77 138/68 136/69   Pulse: 91 77 71   Resp: 16 15 17    Temp: (!) 100.6 F (38.1 C)  98.6 F (37 C)   TempSrc: Oral  Oral   SpO2: (!) 88% 97% 93%   Weight:    77.1 kg  Height:    5' 6 (1.676 m)    Final diagnoses:  Influenza  Acute respiratory failure with hypoxia (HCC)    Admission/ observation were discussed with the admitting physician, patient and/or family and they are comfortable with the plan.       Final diagnoses:  Influenza  Acute respiratory failure with hypoxia [facility])    ED Discharge Orders     None          Emil Share, DO 10/09/24 0703  "

## 2024-10-09 NOTE — Assessment & Plan Note (Signed)
 Resume home tramadol  as needed Encourage mobility

## 2024-10-09 NOTE — H&P (Signed)
 "  Telemedicine  History and Physical    Patient: Tashonna Descoteaux FMW:[id number] DOB: Dec 25, 1945 DOA: 10/09/2024 DOS: the patient was seen and examined on 10/09/2024 PCP: Cleotilde Martie BRAVO, PA   Referring Provider: Rolan Quale, DO Telemedicine Provider: Burnard Cunning, DO Patient Location: [facility] Referring Diagnosis: Flu+ / Nausea & Vomiting / Acute respiratory failure with hypoxia Patient Name and DOB verified: Will Earnie Mayor, 1946/06/01 Patient consented to Telemedicine Evaluation: Yes RN virtual assistant: Delanna Polite, RN Video encounter time and date: 10/09/2024 2:06 PM   Patient coming from: Home  Chief Complaint:  Chief Complaint  Patient presents with   Influenza   HPI: Nguyet Mercer is a 79 y.o. female with medical history significant of essential hypertension, hyperlipidemia, left carotid stenosis, hx of lumbar fusion x 3 who presented to [facility] ED for evaluation of nausea/vomiting and headache, in the setting of exposure to several family members with influenza A infection.  Pt reports being on vacation in [place] last week with 8 family members in the same house, her grandson initially got sick & others quickly followed.  They returned home on jan 3rd.  Husband started coughing that day, was feeling progressively worse, went to urgent care and presumed +Flu A, was started on Tamiflu  and symptomatic care.  Pt reports she started feeling sick later that night with severe headache and nausea/vomiting.  She went to urgent care at 7:00 on Tues AM, was also started Tamiflu  and dissolving nausea medication.  Pt reports headache was persistent, never got better.  Very poor appetite, had not eaten x 2 days prior to coming in. Emesis now just bile/acid.  She reports drinking liquid IV and PediaLyte at home, and taking Tylenol  but continued feeling worse, so came to the ER for evaluation.   Pt reports getting some shortness of breath with activity, has asthma not on medications.   With this illness, has only mild cough.  Left side throat discomfort and and left ear fullness, but otherwise doesn't feel congested, feels like having some post-nasal drainage. Pt denies history of headaches or migraines.  No photophobia.  Reports chills and low grade fevers.   ED course -  Initial vitals-temp 100.6 F, HR 91, RR 16, initial BP 165/77, later improved to 138/68, SpO2 88% on room air. Labs obtained including CMP and CBC were notable for sodium 134, chloride 95, glucose 118 calcium  8.8, WBC 3.7. Lactic acid was normal 0.8. 2 sets of blood cultures were obtained. Chest x-ray showed no acute findings, including no evidence of pneumonia.  Patient was treated in the ED with 1000 mg Tylenol , 12.5 mg IV Benadryl , 5 mg IV Reglan , Tamiflu , 1 L bolus normal saline followed by lactated Ringer 's at 24 cc/h.  Patient is being admitted for observation and further evaluation management as outlined in detail below for influenza A infection complicated by acute hypoxic respiratory failure and intractable nausea and vomiting requiring IV therapies.    Review of Systems: As mentioned in the history of present illness. All other systems reviewed and are negative.   Past Medical History:  Diagnosis Date   Anxiety    Arthritis    some in my back; was in my hips (03/22/2016)   Asthma    Chronic lower back pain    Depression    Difficult intubation    Family history of anesthesia complication    my aunt didn't go out during OR   Fibrocystic breast disease    H/O hiatal hernia  High cholesterol    HLD (hyperlipidemia) 05/08/2019   Hypertension    IBS (irritable bowel syndrome)    Obesity    PONV (postoperative nausea and vomiting)    bad after last hip replacement   Sleep apnea    never RX mask (03/22/2016)   Past Surgical History:  Procedure Laterality Date   BACK SURGERY     BILATERAL TEMPOROMANDIBULAR JOINT ARTHROPLASTY Bilateral 2002   BREAST CYST ASPIRATION Left 1980's    COLONOSCOPY W/ POLYPECTOMY  ~ 2007   DEBRIDEMENT TENNIS ELBOW Right 1990   DILATION AND CURETTAGE OF UTERUS  1980's X 2   EYE SURGERY Bilateral 1974   Pinguecula   FINGER SURGERY Right    index cyst removed   JOINT REPLACEMENT     KNEE ARTHROSCOPY WITH SUBCHONDROPLASTY Left 05/08/2018   Procedure: KNEE ARTHROSCOPY WITH SUBCHONDROPLASTY;  Surgeon: Sheril Coy, MD;  Location: [facility] OR;  Service: Orthopedics;  Laterality: Left;   LAPAROSCOPIC CHOLECYSTECTOMY  1999   LUMBAR DISC SURGERY  01/2009   LUMBAR FUSION  08/2009   LUMBAR LAMINECTOMY  1981   MASS EXCISION Left 04/10/2017   Procedure: EXCISION OF LEFT INDEX FINGER CYST;  Surgeon: Sebastian Lenis, MD;  Location: [facility];  Service: Orthopedics;  Laterality: Left;   TONSILLECTOMY  1983   TOTAL ABDOMINAL HYSTERECTOMY  1996   TOTAL HIP ARTHROPLASTY Right 03/18/2014   Procedure: RIGHT TOTAL HIP ARTHROPLASTY ANTERIOR APPROACH;  Surgeon: Coy KANDICE Sheril, MD;  Location: [facility] OR;  Service: Orthopedics;  Laterality: Right;   TOTAL HIP ARTHROPLASTY Left 03/22/2016   TOTAL HIP ARTHROPLASTY Left 03/22/2016   Procedure: TOTAL HIP ARTHROPLASTY ANTERIOR APPROACH;  Surgeon: Coy Sheril, MD;  Location: [facility];  Service: Orthopedics;  Laterality: Left;   TOTAL KNEE ARTHROPLASTY Left 11/13/2018   Procedure: TOTAL KNEE ARTHROPLASTY;  Surgeon: Sheril Coy, MD;  Location: [facility] OR;  Service: Orthopedics;  Laterality: Left;   TUBAL LIGATION  1981   Social History:  reports that she has never smoked. She has never used smokeless tobacco. She reports current alcohol use. She reports that she does not use drugs.  Allergies[1]  Family History  Problem Relation Age of Onset   Heart disease Mother    Colon cancer Father    Breast cancer Neg Hx     Prior to Admission medications  Medication Sig Start Date End Date Taking? Authorizing Provider  oseltamivir  (TAMIFLU ) 75 MG capsule Take 75 mg by mouth 2 (two) times daily. 10/08/24  Yes [provider]  acetaminophen  (TYLENOL ) 325 MG tablet Take 650 mg by mouth every 6 (six) hours as needed for mild pain or headache.    [provider]  amoxicillin (AMOXIL) 500 MG tablet Take 2,000 mg by mouth See admin instructions. Before dental procedures    [provider]  Cholecalciferol  (VITAMIN D3) 5000 units TABS Take 5,000 Units by mouth at bedtime.     [provider]  Cyanocobalamin (VITAMIN B-12) 5000 MCG TBDP Take 5,000 mcg by mouth at bedtime.     [provider]  diclofenac  (VOLTAREN ) 50 MG EC tablet Take 50 mg by mouth daily. 01/28/22   [provider]  FLUoxetine  (PROZAC ) 20 MG capsule Take 20 mg by mouth daily.    [provider]  losartan  (COZAAR ) 50 MG tablet TAKE 1 TABLET BY MOUTH EVERY DAY 09/11/24   Tolia, Sunit, DO  rosuvastatin  (CRESTOR ) 40 MG tablet Take 1 tablet (40 mg total) by mouth at bedtime. 09/06/23 08/31/24  Tolia, Sunit, DO  traMADol  (ULTRAM ) 50 MG tablet take 1-2  tablet by oral route  every 8 hours as needed 12/01/20   [provider]    Physical Exam: Vitals:   10/09/24 1015 10/09/24 1145 10/09/24 1230 10/09/24 1400  BP: (!) 100/52 (!) 98/55 114/64 (!) 129/55  Pulse: 65 67 64 73  Resp: 11 16 17 17   Temp:    99.3 F (37.4 C)  TempSrc:    Oral  SpO2: 96% 97% 99% 100%  Weight:      Height:       Bedside physical exam was performed by RN listed above. Below exam findings are based on their in person physical exam findings and my observations during virtual encounter.  General exam: awake, alert, no acute distress HEENT: voice clear, hearing grossly normal  Respiratory system: mild expiratory wheezes, normal respiratory effort, on 2 L/min [place] O2. Cardiovascular system: normal S1/S2, RRR, no peripheral edema.   Gastrointestinal system: soft, NT, ND Central nervous system: A&O x 4. no gross focal neurologic deficits, normal speech Skin: dry, intact, normal temperature normal color per  Rn Psychiatry: normal mood, congruent affect, judgement and insight appear normal   Data Reviewed:  As reviewed in detail above  Assessment and Plan:  * Intractable nausea and vomiting In setting of influenza A infection.   --IV antiemetics PRN --Monitor & replace electrolytes --Diet as tolerated --Maintenance IV fluids: LR @ 75 cc/hr --Mgmt of flu as outlined  Acute hypoxic respiratory failure (HCC) Pt O2 sats dropped to 88% on RA with ambulation in the ED.  Currently on 2 L/min [place] O2 with spO2 93-97%.  Not on O2 at baseline.  Due to influenza A infection most likely. --Supplement O2 to keep spO2 > 92% --Check ambulatory O2 sats daily --Wean as tolerated --Incentive spirometer --Albuterol  nebs PRN --Mgmt of flu as outlined  Influenza A Just started on Tamiflu  this AM --Continue Tamiflu  to complete 5 day course  Headache - due to flu --Tylenol  PRN --Headache cocktail with IV Toradol  / Reglan  / Benadryl  PRN if refractory  Leukopenia Thrombocytopenia Suspect due to viral infection. WBC 3.7 on admission. Platelets 145 on admission. --Monitor CBC  Essential hypertension Initial BP in the ED was elevated 165/77, since then controlled and soft at times as low as 98/55 earlier today. --Resume home BP regimen as BP tolerates, pending med history.  Appears patient takes losartan  50 mg daily  Mood disorder Resume home Prozac  pending med history  HLD (hyperlipidemia) Resume home Crestor  pending med history  S/P lumbar fusion Resume home tramadol  as needed Encourage mobility  Primary osteoarthritis of left knee Resume home tramadol  as needed  Primary osteoarthritis of left hip Resume home tramadol  as needed.      Advance Care Planning: Code status - full code Pt states she would want attempts at CPR/defibrillation/ ACLS meds and intubation in the event of a Code, but states that she would not want heroic measures or to kept alive if any irreversible cause were  found in the aftermath of cardiac or respiratory arrest or if CPR did not work.  Consults: None  Family Communication: none present during virtual admission encounter.  Severity of Illness: The appropriate patient status for this patient is OBSERVATION. Observation status is judged to be reasonable and necessary in order to provide the required intensity of service to ensure the patient's safety. The patient's presenting symptoms, physical exam findings, and initial radiographic and laboratory data in the context of their medical  condition is felt to place them at decreased risk for further clinical deterioration. Furthermore, it is anticipated that the patient will be medically stable for discharge from the hospital within 2 midnights of admission.   Author: Burnard DELENA Cunning, DO 10/09/2024 2:50 PM  For on call review [email or web address].      [1]  Allergies Allergen Reactions   Shellfish Allergy Hives and Nausea And Vomiting    SHRIMP LOBSTER   Tape Other (See Comments)    causes blisters   Codeine Nausea And Vomiting   Dilaudid  [Hydromorphone  Hcl] Nausea And Vomiting   Florastor Kids [Saccharomyces Boulardii] Other (See Comments)    SIGNIFICANT CONSTIPATION   Garlic Other (See Comments)    Stomach Upset   Morphine  Sulfate Nausea And Vomiting   Nucynta [Tapentadol] Other (See Comments)    hallucinations    Percocet [Oxycodone-Acetaminophen ] Nausea And Vomiting   "

## 2024-10-09 NOTE — ED Triage Notes (Signed)
 Pt arrives with spouse from home (independent living at friends home) due to continued nausea and vomiting.  Pt was exposed to a flu positive family member and began feeling sick Monday with headache and feeling unwell. She was seen at [facility] walk in clinc and given odt zofran  and tamiflu .  Pt has had continued nausea and vomiting.  Pt denies any sob but spo2 is 89% on RA during triage.

## 2024-10-09 NOTE — ED Notes (Signed)
 Pt spo2 on RA dips as low as 85% without any pt distress and comes up to mid 90's after pulmonary toilet but then dips back to 89-90%.  Pt placed on 2L [place]

## 2024-10-09 NOTE — Progress Notes (Signed)
 " Progress Note   Patient: Kimberly Lowery FMW:[id number] DOB: September 14, 1946 DOA: 10/09/2024     0 DOS: the patient was seen and examined on 10/09/2024   Brief hospital course: No notes on file  Assessment and Plan: * Intractable nausea and vomiting In setting of influenza A infection.   --IV antiemetics PRN --Monitor & replace electrolytes --Diet as tolerated --Maintenance IV fluids: LR @ 75 cc/hr --Mgmt of flu as outlined  Acute hypoxic respiratory failure (HCC) Pt O2 sats dropped to 88% on RA with ambulation in the ED.  Currently on 2 L/min [place] O2 with spO2 93-97%.  Not on O2 at baseline.  Due to influenza A infection most likely. --Supplement O2 to keep spO2 > 92% --Check ambulatory O2 sats daily --Wean as tolerated --Incentive spirometer --Albuterol  nebs PRN --Mgmt of flu as outlined  Influenza A Just started on Tamiflu  this AM --Continue Tamiflu  to complete 5 day course  Headache - due to flu --Tylenol  PRN --Headache cocktail with IV Toradol  / Reglan  / Benadryl  PRN if refractory  Leukopenia Thrombocytopenia Suspect due to viral infection. WBC 3.7 on admission. Platelets 145 on admission. --Monitor CBC  Essential hypertension Initial BP in the ED was elevated 165/77, since then controlled and soft at times as low as 98/55 earlier today. --Resume home BP regimen as BP tolerates, pending med history.  Appears patient takes losartan  50 mg daily  Mood disorder Resume home Prozac  pending med history  HLD (hyperlipidemia) Resume home Crestor  pending med history  S/P lumbar fusion Resume home tramadol  as needed Encourage mobility  Primary osteoarthritis of left knee Resume home tramadol  as needed  Primary osteoarthritis of left hip Resume home tramadol  as needed.        Subjective: Patient admitted from [facility] emergency room to [facility] for intractable nausea vomiting and hypoxic respiratory failure.  Patient is seen in room  5M03C-01.  Husband is at the bedside.  Currently the patient is picking at some food for dinner.  Nausea and emesis have responded to medications.  Still requiring 2 L/min of oxygen.  Patient is concerned that she get her usual daily medications.  Physical Exam: Vitals:   10/09/24 1145 10/09/24 1230 10/09/24 1400 10/09/24 1608  BP: (!) 98/55 114/64 (!) 129/55 (!) 132/57  Pulse: 67 64 73 66  Resp: 16 17 17 18   Temp:   99.3 F (37.4 C) 98.9 F (37.2 C)  TempSrc:   Oral Oral  SpO2: 97% 99% 100% 99%  Weight:      Height:      She is awake alert , acutely ill with mild increase in work of breathing but is on 2 L/min. HEENT: PERRLA, no scleral icterus, oropharynx moist no JVD Cardiovascular: Regular rate and rhythm S1-S2 no murmurs rubs gallops Lungs: Good air entry with occasional expiratory wheeze Abdomen: Soft Extremities: No edema well-perfused Neurologic: Looks acutely ill but is able to follow commands give a coherent history.  Data Reviewed:  There are no new results to review at this time.  Family Communication: Husband was present at the time of my interview and exam.  Disposition: Status is: Observation The patient remains OBS appropriate and will d/c before 2 midnights.  Planned Discharge Destination: Home   Asked nursing to make sure the pharmacy technician was contacted so we could have her medicines reconciled.  Patient did states she usually has a glass of wine with dinner but has no tremors, confusion, tachycardia, diaphoresis, or other signs or symptoms  to suggest any issues with alcohol withdrawal. Time spent: 10  minutes  Author: Lonni KANDICE Moose, MD 10/09/2024 5:14 PM  For on call review [email or web address].  "

## 2024-10-09 NOTE — Assessment & Plan Note (Signed)
 Resume home Crestor  pending med history

## 2024-10-09 NOTE — Plan of Care (Signed)

## 2024-10-10 LAB — BASIC METABOLIC PANEL WITH GFR
Anion gap: 7 (ref 5–15)
BUN: 12 mg/dL (ref 8–23)
CO2: 30 mmol/L (ref 22–32)
Calcium: 8.3 mg/dL — ABNORMAL LOW (ref 8.9–10.3)
Chloride: 102 mmol/L (ref 98–111)
Creatinine, Ser: 0.81 mg/dL (ref 0.44–1.00)
GFR, Estimated: >60 mL/min
Glucose, Bld: 89 mg/dL (ref 70–99)
Potassium: 4.6 mmol/L (ref 3.5–5.1)
Sodium: 140 mmol/L (ref 135–145)

## 2024-10-10 LAB — CBC
HCT: 35.5 % — ABNORMAL LOW (ref 36.0–46.0)
Hemoglobin: 11.5 g/dL — ABNORMAL LOW (ref 12.0–15.0)
MCH: 30.8 pg (ref 26.0–34.0)
MCHC: 32.4 g/dL (ref 30.0–36.0)
MCV: 95.2 fL (ref 80.0–100.0)
Platelets: 134 K/uL — ABNORMAL LOW (ref 150–400)
RBC: 3.73 MIL/uL — ABNORMAL LOW (ref 3.87–5.11)
RDW: 14 % (ref 11.5–15.5)
WBC: 3 K/uL — ABNORMAL LOW (ref 4.0–10.5)
nRBC: 0 % (ref 0.0–0.2)

## 2024-10-10 LAB — MAGNESIUM: Magnesium: 2.3 mg/dL (ref 1.7–2.4)

## 2024-10-10 MED ORDER — LOSARTAN POTASSIUM 50 MG PO TABS
50.0000 mg | ORAL_TABLET | Freq: Every day | ORAL | Status: DC
  Administered 2024-10-11: 50 mg via ORAL
  Filled 2024-10-10 (×2): qty 1

## 2024-10-10 MED ORDER — FLUOXETINE HCL 20 MG PO CAPS
20.0000 mg | ORAL_CAPSULE | Freq: Every day | ORAL | Status: DC
  Administered 2024-10-10: 20 mg via ORAL
  Filled 2024-10-10 (×2): qty 1

## 2024-10-10 MED ORDER — NON FORMULARY: Freq: Every day | Status: DC

## 2024-10-10 MED ORDER — ROSUVASTATIN CALCIUM 20 MG PO TABS
20.0000 mg | ORAL_TABLET | Freq: Every day | ORAL | Status: DC
  Administered 2024-10-10 – 2024-10-11 (×2): 20 mg via ORAL
  Filled 2024-10-10 (×2): qty 1

## 2024-10-10 NOTE — Progress Notes (Signed)
 "  PROGRESS NOTE    Kimberly Lowery  FMW:[id number] DOB: [birth year] DOA: 10/09/2024 PCP: Cle[place]ilde, [place]  E, PA  Subjective: Still feels out of sorts.  Fever last night.  Appetite has not returned.  Having some diarrhea but no nausea and emesis.  A little bit of chest congestion and wheezing.  Feels weak and worn out.  Husband not at the bedside.  Hospital Course: 10/10/24: By appearance and labs she appears to be better hydrated.  Continue Tamiflu .  Some headache.  Takes a daily NSAID.  Restart home diclofenac .  Encourage caffeine intake.  Encourage p.o. intake.   Assessment and Plan:   Assessment and Plan:   * Intractable nausea and vomiting In setting of influenza A infection.   --IV antiemetics PRN --Monitor & replace electrolytes --Diet as tolerated --Maintenance IV fluids: LR @ 75 cc/hr --Mgmt of flu as outlined   Acute hypoxic respiratory failure (HCC) -Resolved now on room air 10/10/24 Pt O2 sats dropped to 88% on RA with ambulation in the ED.  Currently on 2 L/min [place] O2 with spO2 93-97%.  Not on O2 at baseline.  Due to influenza A infection most likely. --Supplement O2 to keep spO2 > 92% --Check ambulatory O2 sats daily --Wean as tolerated --Incentive spirometer --Albuterol  nebs PRN --Mgmt of flu as outlined   Influenza A Just started on Tamiflu  this AM --Continue Tamiflu  to complete 5 day course   Headache - due to flu --Tylenol  PRN -Resume daily diclofenac , drink caffeine. --Headache cocktail with IV Toradol  / Reglan  / Benadryl  PRN if refractory   Leukopenia From flu Thrombocytopenia Suspect due to viral infection. WBC 3.7 on admission. Platelets 145 on admission.  Currently 134.  No bleeding. --Monitor CBC   Essential hypertension Initial BP in the ED was elevated 165/77, since then controlled and soft at times as low as 98/55 earlier today. --Resume home BP regimen as BP tolerates, pending med history.  Appears patient takes losartan  50 mg daily   Mood  disorder Resume home Prozac  pending med history   HLD (hyperlipidemia) Resume home Crestor  pending med history   S/P lumbar fusion Resume home tramadol  as needed Encourage mobility   Primary osteoarthritis of left knee Resume home tramadol  as needed   Primary osteoarthritis of left hip Resume home tramadol  as needed.          Advance Care Planning: Code status - full code Pt states she would want attempts at CPR/defibrillation/ ACLS meds and intubation in the event of a Code, but states that she would not want heroic measures or to kept alive if any irreversible cause were found in the aftermath of cardiac or respiratory arrest or if CPR did not work.   DVT prophylaxis: enoxaparin  (LOVENOX ) injection 40 mg Start: 10/09/24 1700  Lovenox    Code Status: Full Code Family Communication: Husband not at the bedside Disposition Plan: Home Reason for continuing need for hospitalization: Fever last night still with ongoing poor p.o. intake subject to dehydration.  Objective: Vitals:   10/09/24 1400 10/09/24 1608 10/09/24 2210 10/10/24 0402  BP: (!) 129/55 (!) 132/57 (!) 109/59 (!) 170/70  Pulse: 73 66 70 77  Resp: 17 18 15 15   Temp: 99.3 F (37.4 C) 98.9 F (37.2 C) 98.2 F (36.8 C) (!) 101.3 F (38.5 C)  TempSrc: Oral Oral Oral Oral  SpO2: 100% 99% 100% 92%  Weight:      Height:        Intake/Output Summary (Last 24 hours) at 10/10/2024  0827 Last data filed at 10/10/2024 0500 Gross per 24 hour  Intake 715.63 ml  Output --  Net 715.63 ml   Filed Weights   10/09/24 0500  Weight: 77.1 kg    Examination:  Physical Exam Constitutional:      Appearance: Normal appearance. She is not ill-appearing.  HENT:     Head: Normocephalic and atraumatic.     Nose: Nose normal. No rhinorrhea.  Eyes:     General: No scleral icterus.    Extraocular Movements: Extraocular movements intact.     Pupils: Pupils are equal, round, and reactive to light.  Cardiovascular:     Rate  and Rhythm: Normal rate.     Heart sounds: No murmur heard. Pulmonary:     Effort: Pulmonary effort is normal.  Abdominal:     General: There is no distension.     Palpations: Abdomen is soft.     Tenderness: There is no abdominal tenderness. There is no guarding or rebound.  Musculoskeletal:     Cervical back: No rigidity.     Right lower leg: No edema.     Left lower leg: No edema.  Skin:    General: Skin is warm and dry.     Coloration: Skin is not jaundiced.  Neurological:     Mental Status: She is alert. Mental status is at baseline.  Psychiatric:        Mood and Affect: Mood normal.     Data Reviewed: I have personally reviewed following labs and imaging studies  CBC: Recent Labs  Lab 10/09/24 0334 10/09/24 2152 10/10/24 0441  WBC 3.7* 2.8* 3.0*  NEUTROABS 2.9  --   --   HGB 12.1 11.6* 11.5*  HCT 36.8 35.8* 35.5*  MCV 92.7 96.0 95.2  PLT 145* 133* 134*   Basic Metabolic Panel: Recent Labs  Lab 10/09/24 0334 10/09/24 2152 10/10/24 0441  NA 134*  --  140  K 4.1  --  4.6  CL 95*  --  102  CO2 30  --  30  GLUCOSE 118*  --  89  BUN 11  --  12  CREATININE 0.78 0.89 0.81  CALCIUM  8.8*  --  8.3*  MG  --   --  2.3   GFR: Estimated Creatinine Clearance: 61.9 mL/min (by C-G formula based on SCr of 0.81 mg/dL). Liver Function Tests: Recent Labs  Lab 10/09/24 0334  AST 32  ALT 22  ALKPHOS 60  BILITOT 0.5  PROT 6.8  ALBUMIN 4.1   No results for input(s): LIPASE, AMYLASE in the last 168 hours. No results for input(s): AMMONIA in the last 168 hours. Coagulation Profile: No results for input(s): INR, PROTIME in the last 168 hours. Cardiac Enzymes: No results for input(s): CKTOTAL, CKMB, CKMBINDEX, TROPONINI in the last 168 hours. ProBNP, BNP (last 5 results) No results for input(s): PROBNP, BNP in the last 8760 hours. HbA1C: No results for input(s): HGBA1C in the last 72 hours. CBG: No results for input(s): GLUCAP in the last  168 hours. Lipid Profile: No results for input(s): CHOL, HDL, LDLCALC, TRIG, CHOLHDL, LDLDIRECT in the last 72 hours. Thyroid  Function Tests: No results for input(s): TSH, T4TOTAL, FREET4, T3FREE, THYROIDAB in the last 72 hours. Anemia Panel: No results for input(s): VITAMINB12, FOLATE, FERRITIN, TIBC, IRON, RETICCTPCT in the last 72 hours. Sepsis Labs: Recent Labs  Lab 10/09/24 0334  LATICACIDVEN 0.8    Recent Results (from the past 240 hours)  Blood culture (routine x 2)  Status: None (Preliminary result)   Collection Time: 10/09/24  3:07 AM   Specimen: BLOOD  Result Value Ref Range Status   Specimen Description   Final    BLOOD LEFT ANTECUBITAL Performed at [facility], [address] [place], [place] [address]    Special Requests   Final    BOTTLES DRAWN AEROBIC AND ANAEROBIC Blood Culture adequate volume Performed at [facility], [address] [place], [place] [address]    Culture   Final    NO GROWTH 1 DAY Performed at [facility], [address], [place], [place] [address]    Report Status PENDING  Incomplete  Blood culture (routine x 2)     Status: None (Preliminary result)   Collection Time: 10/09/24  3:34 AM   Specimen: BLOOD RIGHT HAND  Result Value Ref Range Status   Specimen Description   Final    BLOOD RIGHT HAND Performed at [facility], [address], [place], [place] [address]    Special Requests   Final    BOTTLES DRAWN AEROBIC AND ANAEROBIC Blood Culture results may not be optimal due to an inadequate volume of blood received in culture bottles Performed at [facility], [address], [place], [place] [address]    Culture   Final    NO GROWTH 1 DAY Performed at [facility], [address], [place], [place] [address]    Report Status PENDING  Incomplete     Radiology Studies: DG Chest Port 1 View Result Date:  10/09/2024 EXAM: 1 VIEW(S) XRAY OF THE CHEST 10/09/2024 03:05:59 AM COMPARISON: 04/10/2020. CLINICAL HISTORY: Cough. FINDINGS: LUNGS AND PLEURA: No focal pulmonary opacity. No pleural effusion. No pneumothorax. HEART AND MEDIASTINUM: No acute abnormality of the cardiac and mediastinal silhouettes. BONES AND SOFT TISSUES: No acute osseous abnormality. IMPRESSION: 1. No active cardiopulmonary disease. Electronically signed by: Franky Crease MD 10/09/2024 03:12 AM EST RP Workstation: [id number]    Scheduled Meds:  enoxaparin  (LOVENOX ) injection  40 mg Subcutaneous Q24H   oseltamivir   75 mg Oral BID   Continuous Infusions:   LOS: 0 days   Time spent:31 minutes  Lonni KANDICE Moose, MD  [facility]  10/10/2024, 8:27 AM   "

## 2024-10-10 NOTE — Care Management Obs Status (Signed)
 MEDICARE OBSERVATION STATUS NOTIFICATION   Patient Details  Name: Kimberly Lowery MRN: [id number] Date of Birth: June 20, 1946   Medicare Observation Status Notification Given:  Yes  Verbally reviewed observation notice with Will Mayor telephonically at [phone]. Will deliver copy to the patients room.     Scotlynn Noyes 10/10/2024, 11:38 AM

## 2024-10-10 NOTE — TOC Initial Note (Signed)
 Transition of Care [facility]) - Initial/Assessment Note    Patient Details  Name: Kimberly Lowery MRN: [id number] Date of Birth: [birth year]  [facility] CM/SW Contact:    Lendia Dais, LCSWA Phone Number: 10/10/2024, 11:14 AM  Clinical Narrative:  CSW spoke to pt at bedside and introduced self and role.  Pt is from [facility] w/ husband. Pt is indep w/ ADL's, drives, has seen PCP in the last year, and has the DME of a can/walker/portable toilet seat.   Pt's source of income is SSI/Investments and states they are able to afford their basic needs and have no SDOH concerns.   Pt has no hx of HH/SNF and has hx of Depression & no hx of SU. Pt declined MH resources.  No current TOC needs. Please place a [facility] consult for further needs.               Expected Discharge Plan: Home/Self Care Barriers to Discharge: Continued Medical Work up   Patient Goals and CMS Choice Patient states their goals for this hospitalization and ongoing recovery are:: Make it to 82   Choice offered to / list presented to : NA      Expected Discharge Plan and Services In-house Referral: Clinical Social Work     Living arrangements for the past 2 months: Single Family Home                                      Prior Living Arrangements/Services Living arrangements for the past 2 months: Single Family Home Lives with:: Spouse Patient language and need for interpreter reviewed:: Yes Do you feel safe going back to the place where you live?: Yes      Need for Family Participation in Patient Care: No (Comment) Care giver support system in place?: No (comment) Current home services: DME Criminal Activity/Legal Involvement Pertinent to Current Situation/Hospitalization: No - Comment as needed  Activities of Daily Living   ADL Screening (condition at time of admission) Independently performs ADLs?: Yes (appropriate for developmental age) Is the patient deaf or have  difficulty hearing?: No Does the patient have difficulty seeing, even when wearing glasses/contacts?: No Does the patient have difficulty concentrating, remembering, or making decisions?: No  Permission Sought/Granted Permission sought to share information with : Family Supports Permission granted to share information with : Yes, Verbal Permission Granted  Share Information with NAME: Lasharon Dunivan     Permission granted to share info w Relationship: Spouse  Permission granted to share info w Contact Information: [phone]  Emotional Assessment Appearance:: Appears stated age Attitude/Demeanor/Rapport: Engaged Affect (typically observed): Appropriate Orientation: : Oriented to Self, Oriented to Situation, Oriented to Place, Oriented to  Time Alcohol / Substance Use: Not Applicable Psych Involvement: No (comment)  Admission diagnosis:  Acute respiratory failure with hypoxia (HCC) [J96.01] Intractable nausea and vomiting [R11.2] Influenza [J11.1] Patient Active Problem List   Diagnosis Date Noted   Intractable nausea and vomiting 10/09/2024   Influenza A 10/09/2024   Acute hypoxic respiratory failure (HCC) 10/09/2024   Mood disorder 10/09/2024   Essential hypertension 10/09/2024   Leukopenia 10/09/2024   Acute respiratory failure with hypoxia (HCC) 10/09/2024   S/P lumbar fusion 04/20/2020   HLD (hyperlipidemia) 05/08/2019   Primary localized osteoarthritis of left knee 11/13/2018   Primary osteoarthritis of left knee 11/13/2018   Primary osteoarthritis of left hip 03/22/2016   Degenerative  joint disease (DJD) of hip 03/18/2014   PCP:  Cleotilde, [place]  E, PA Pharmacy:   [id number] [place], [place] - [address] [place] [address] Phone: [phone] Fax: [phone]  [id number] [place], [place] - [address] [place] [address] Phone: [phone] Fax: [phone]     Social Drivers of  Health (SDOH) Social History: SDOH Screenings   Food Insecurity: No Food Insecurity (10/09/2024)  Housing: Low Risk (10/09/2024)  Transportation Needs: No Transportation Needs (10/09/2024)  Utilities: Not At Risk (10/09/2024)  Physical Activity: Insufficiently Active (11/25/2022)  Social Connections: Moderately Isolated (10/09/2024)  Tobacco Use: Low Risk (10/09/2024)   SDOH Interventions:     Readmission Risk Interventions     No data to display

## 2024-10-11 NOTE — Discharge Summary (Signed)
 " Physician Discharge Summary   Patient: Kimberly Lowery MRN: [id number] DOB: [birth year]  Admit date:     10/09/2024  Discharge date: 10/11/2024  Discharge Physician: Lonni KANDICE Moose   PCP: Cleotilde, [place]  E, PA   Recommendations at discharge:   Follow-up with PCP in 1 to 2 weeks. If you develop increasing fever, cough with sputum, worsening shortness of breath follow-up with your PCP immediately.  Discharge Diagnoses: Principal Problem:   Intractable nausea and vomiting Active Problems:   Influenza A   Acute hypoxic respiratory failure (HCC)   Essential hypertension   Leukopenia   HLD (hyperlipidemia)   Mood disorder   Primary osteoarthritis of left hip   Primary osteoarthritis of left knee   S/P lumbar fusion   Acute respiratory failure with hypoxia (HCC)  Resolved Problems:   * No resolved hospital problems. [facility] Hospital Course: This 79 year old female presented with intractable nausea vomiting acute hypoxic respiratory failure with oxygen saturations to 88% on room air.  She was diagnosed with influenza A.  She had been on Tamiflu  but just started it could not keep the medication down.  She was dehydrated.  She was admitted to hospital started on Tamiflu  antiemetics and vigorously hydrated.  Chest x-ray did not show suggest pneumonia.  Over the next 2 days her symptoms resolved.  Oxygen requirement resolved.  She was 92% on room air at rest and 96% on room air with ambulation.  He was tolerating diet at the time of discharge.  Headache had resolved.  She was cautioned that if she develops fever, worsening cough, sputum, and recurrence of shortness of breath to present to her physician due to concerns for postviral secondary bacterial  pneumonia.  Assessment and Plan: * Intractable nausea and vomiting In setting of influenza A infection.   --IV antiemetics PRN --Monitor & replace electrolytes --Diet as tolerated --Maintenance IV fluids: LR @ 75 cc/hr --Mgmt of flu as  outlined  Acute hypoxic respiratory failure (HCC) Pt O2 sats dropped to 88% on RA with ambulation in the ED.  Currently on 2 L/min [place] O2 with spO2 93-97%.  Not on O2 at baseline.  Due to influenza A infection most likely. --Supplement O2 to keep spO2 > 92% --Check ambulatory O2 sats daily --Wean as tolerated --Incentive spirometer --Albuterol  nebs PRN --Mgmt of flu as outlined  Influenza A Just started on Tamiflu  this AM --Continue Tamiflu  to complete 5 day course  Headache - due to flu --Tylenol  PRN --Headache cocktail with IV Toradol  / Reglan  / Benadryl  PRN if refractory  Leukopenia Thrombocytopenia Suspect due to viral infection. WBC 3.7 on admission. Platelets 145 on admission. --Monitor CBC  Essential hypertension Initial BP in the ED was elevated 165/77, since then controlled and soft at times as low as 98/55 earlier today. --Resume home BP regimen as BP tolerates, pending med history.  Appears patient takes losartan  50 mg daily  Mood disorder Resume home Prozac  pending med history  HLD (hyperlipidemia) Resume home Crestor  pending med history  S/P lumbar fusion Resume home tramadol  as needed Encourage mobility  Primary osteoarthritis of left knee Resume home tramadol  as needed  Primary osteoarthritis of left hip Resume home tramadol  as needed.         Consultants: None Procedures performed: None Disposition: Home Diet recommendation:  Discharge Diet Orders (From admission, onward)     Start     Ordered   10/11/24 0000  Diet general        10/11/24 1146  Regular diet DISCHARGE MEDICATION: Allergies as of 10/11/2024       Reactions   Tapentadol Other (See Comments)   hallucinations   Codeine Nausea And Vomiting, Nausea Only, Other (See Comments)   Other Reaction(s): Unknown   Molds & Smuts Hives   Oxycodone-acetaminophen  Nausea Only   Shellfish Allergy Hives, Nausea And Vomiting, Nausea Only   SHRIMP LOBSTER   Shrimp Extract  Nausea Only   Tape Other (See Comments)   causes blisters   Acetaminophen  Nausea Only   Patient states this is incorrect   Hydrocodone  Nausea Only   Hydromorphone  Nausea Only, Dermatitis   Morphine  Nausea Only   Oxycodone Nausea Only   Dilaudid  [hydromorphone  Hcl] Nausea And Vomiting   Dust Mite Extract Hives   Florastor Kids [saccharomyces Boulardii] Other (See Comments)   SIGNIFICANT CONSTIPATION   Garlic Other (See Comments)   Stomach Upset   Morphine  Sulfate Nausea And Vomiting   Percocet [oxycodone-acetaminophen ] Nausea And Vomiting        Medication List     TAKE these medications    acetaminophen  325 MG tablet Commonly known as: TYLENOL  Take 650 mg by mouth every 6 (six) hours as needed for mild pain or headache.   amoxicillin 500 MG tablet Commonly known as: AMOXIL Take 2,000 mg by mouth daily as needed (prior to dental procedures).   aspirin  EC 81 MG tablet Take 81 mg by mouth daily. Swallow whole.   diclofenac  50 MG EC tablet Commonly known as: VOLTAREN  Take 50 mg by mouth daily.   diclofenac  Sodium 1 % Gel Commonly known as: VOLTAREN  Apply 2 g topically daily as needed (pain).   FLUoxetine  20 MG capsule Commonly known as: PROZAC  Take 20 mg by mouth daily.   Ivermectin-Metronidazol-Niacin 1-1-4 % Gel Apply 1 Application topically daily.   losartan  50 MG tablet Commonly known as: COZAAR  TAKE 1 TABLET BY MOUTH EVERY DAY   ondansetron  8 MG disintegrating tablet Commonly known as: ZOFRAN -ODT Take 8 mg by mouth 3 (three) times daily.   oseltamivir  75 MG capsule Commonly known as: TAMIFLU  Take 75 mg by mouth 2 (two) times daily.   rosuvastatin  20 MG tablet Commonly known as: CRESTOR  Take 20 mg by mouth daily.   rosuvastatin  40 MG tablet Commonly known as: CRESTOR  Take 1 tablet (40 mg total) by mouth at bedtime.   traMADol  50 MG tablet Commonly known as: ULTRAM  take 1-2  tablet by oral route  every 8 hours as needed   Vitamin B-12 5000  MCG Tbdp Take 5,000 mcg by mouth at bedtime.   Vitamin D3 125 MCG (5000 UT) Tabs Take 5,000 Units by mouth at bedtime.        Discharge Exam: Filed Weights   10/09/24 0500  Weight: 77.1 kg   Awake, alert, comfortable normal work of breathing HEENT: PERR L, EOMI, no scleral icterus, oropharynx moist and pink without exudate Cardiac: Regular rate and rhythm S1-S2 no murmurs rubs gallops Lungs: Clear throughout with good air entry Abdomen: Soft, nontender non distended bowel sounds present Remedies no cyanosis clubbing or edema  Condition at discharge: good  The results of significant diagnostics from this hospitalization (including imaging, microbiology, ancillary and laboratory) are listed below for reference.   Imaging Studies: DG Chest Port 1 View Result Date: 10/09/2024 EXAM: 1 VIEW(S) XRAY OF THE CHEST 10/09/2024 03:05:59 AM COMPARISON: 04/10/2020. CLINICAL HISTORY: Cough. FINDINGS: LUNGS AND PLEURA: No focal pulmonary opacity. No pleural effusion. No pneumothorax. HEART AND MEDIASTINUM: No acute abnormality of the  cardiac and mediastinal silhouettes. BONES AND SOFT TISSUES: No acute osseous abnormality. IMPRESSION: 1. No active cardiopulmonary disease. Electronically signed by: Franky Crease MD 10/09/2024 03:12 AM EST RP Workstation: [id number]    Microbiology: Results for orders placed or performed during the hospital encounter of 10/09/24  Blood culture (routine x 2)     Status: None (Preliminary result)   Collection Time: 10/09/24  3:07 AM   Specimen: BLOOD  Result Value Ref Range Status   Specimen Description   Final    BLOOD LEFT ANTECUBITAL Performed at [facility], [address], [place], [place] [address]    Special Requests   Final    BOTTLES DRAWN AEROBIC AND ANAEROBIC Blood Culture adequate volume Performed at [facility], [address], [place], [place] [address]    Culture   Final    NO GROWTH 2 DAYS Performed at [facility], [address], [place], [place] [address]    Report Status PENDING  Incomplete  Blood culture (routine x 2)     Status: None (Preliminary result)   Collection Time: 10/09/24  3:34 AM   Specimen: BLOOD RIGHT HAND  Result Value Ref Range Status   Specimen Description   Final    BLOOD RIGHT HAND Performed at [facility], [address] [place], [place] [address]    Special Requests   Final    BOTTLES DRAWN AEROBIC AND ANAEROBIC Blood Culture results may not be optimal due to an inadequate volume of blood received in culture bottles Performed at [facility], [address], [place], [place] [address]    Culture   Final    NO GROWTH 2 DAYS Performed at [facility], [address], [place], [place] [address]    Report Status PENDING  Incomplete    Labs: CBC: Recent Labs  Lab 10/09/24 0334 10/09/24 2152 10/10/24 0441  WBC 3.7* 2.8* 3.0*  NEUTROABS 2.9  --   --   HGB 12.1 11.6* 11.5*  HCT 36.8 35.8* 35.5*  MCV 92.7 96.0 95.2  PLT 145* 133* 134*   Basic Metabolic Panel: Recent Labs  Lab 10/09/24 0334 10/09/24 2152 10/10/24 0441  NA 134*  --  140  K 4.1  --  4.6  CL 95*  --  102  CO2 30  --  30  GLUCOSE 118*  --  89  BUN 11  --  12  CREATININE 0.78 0.89 0.81  CALCIUM  8.8*  --  8.3*  MG  --   --  2.3   Liver Function Tests: Recent Labs  Lab 10/09/24 0334  AST 32  ALT 22  ALKPHOS 60  BILITOT 0.5  PROT 6.8  ALBUMIN 4.1   CBG: No results for input(s): GLUCAP in the last 168 hours.  Discharge time spent: less than 30 minutes.  Signed: Lonni KANDICE Moose, MD [facility] 10/11/2024 "

## 2024-10-11 NOTE — Plan of Care (Signed)

## 2024-10-11 NOTE — TOC Transition Note (Signed)
 Transition of Care [facility]) - Discharge Note   Patient Details  Name: Kimberly Lowery MRN: [id number] Date of Birth: 24-Feb-1946  Transition of Care [facility]) CM/SW Contact:  Tom-Johnson, Michai Dieppa Daphne, RN Phone Number: 10/11/2024, 11:52 AM   Clinical Narrative:     Patient is scheduled for discharge today.  Hospital f/u and discharge instructions on AVS. No ICM needs or recommendations noted. Husband, Kimberly Lowery at bedside and will transport at discharge.  No further ICM needs noted.      Final next level of care: Home/Self Care Barriers to Discharge: Barriers Resolved   Patient Goals and CMS Choice Patient states their goals for this hospitalization and ongoing recovery are:: To return home CMS Medicare.gov Compare Post Acute Care list provided to:: Patient Choice offered to / list presented to : NA      Discharge Placement                Patient to be transferred to facility by: Husband Name of family member notified: Kimberly Lowery    Discharge Plan and Services Additional resources added to the After Visit Summary for   In-house Referral: Clinical Social Work              DME Arranged: N/A DME Agency: NA       HH Arranged: NA HH Agency: NA        Social Drivers of Health (SDOH) Interventions SDOH Screenings   Food Insecurity: No Food Insecurity (10/09/2024)  Housing: Low Risk (10/09/2024)  Transportation Needs: No Transportation Needs (10/09/2024)  Utilities: Not At Risk (10/09/2024)  Physical Activity: Insufficiently Active (11/25/2022)  Social Connections: Moderately Isolated (10/09/2024)  Tobacco Use: Low Risk (10/09/2024)     Readmission Risk Interventions     No data to display

## 2024-10-14 LAB — CULTURE, BLOOD (ROUTINE X 2)
Culture: NO GROWTH
Culture: NO GROWTH
Special Requests: ADEQUATE
